# Patient Record
Sex: Male | Born: 1970
Health system: Southern US, Community
[De-identification: ages and names within clinical notes are randomized; demographics above are authoritative.]

## PROBLEM LIST (undated history)

## (undated) DIAGNOSIS — N289 Disorder of kidney and ureter, unspecified: Secondary | ICD-10-CM

## (undated) DIAGNOSIS — J45909 Unspecified asthma, uncomplicated: Secondary | ICD-10-CM

## (undated) DIAGNOSIS — J449 Chronic obstructive pulmonary disease, unspecified: Secondary | ICD-10-CM

## (undated) DIAGNOSIS — E119 Type 2 diabetes mellitus without complications: Secondary | ICD-10-CM

## (undated) DIAGNOSIS — I10 Essential (primary) hypertension: Secondary | ICD-10-CM

## (undated) DIAGNOSIS — D696 Thrombocytopenia, unspecified: Secondary | ICD-10-CM

## (undated) DIAGNOSIS — I219 Acute myocardial infarction, unspecified: Secondary | ICD-10-CM

## (undated) DIAGNOSIS — K259 Gastric ulcer, unspecified as acute or chronic, without hemorrhage or perforation: Secondary | ICD-10-CM

## (undated) DIAGNOSIS — M359 Systemic involvement of connective tissue, unspecified: Secondary | ICD-10-CM

## (undated) HISTORY — PX: OTHER SURGICAL HISTORY: SHX169

## (undated) HISTORY — PX: FOOT SURGERY: SHX648

## (undated) HISTORY — PX: CHOLECYSTECTOMY: SHX55

---

## 1998-08-19 ENCOUNTER — Encounter: Payer: Self-pay | Admitting: Emergency Medicine

## 1998-08-19 ENCOUNTER — Emergency Department (HOSPITAL_COMMUNITY): Admission: EM | Admit: 1998-08-19 | Discharge: 1998-08-19 | Payer: Self-pay | Admitting: Emergency Medicine

## 1998-08-20 ENCOUNTER — Encounter: Admission: RE | Admit: 1998-08-20 | Discharge: 1998-11-18 | Payer: Self-pay | Admitting: *Deleted

## 1998-12-15 ENCOUNTER — Emergency Department (HOSPITAL_COMMUNITY): Admission: EM | Admit: 1998-12-15 | Discharge: 1998-12-15 | Payer: Self-pay | Admitting: Emergency Medicine

## 1998-12-16 ENCOUNTER — Emergency Department (HOSPITAL_COMMUNITY): Admission: EM | Admit: 1998-12-16 | Discharge: 1998-12-16 | Payer: Self-pay

## 1998-12-21 ENCOUNTER — Encounter: Payer: Self-pay | Admitting: *Deleted

## 1998-12-22 ENCOUNTER — Encounter: Payer: Self-pay | Admitting: Emergency Medicine

## 1998-12-22 ENCOUNTER — Inpatient Hospital Stay (HOSPITAL_COMMUNITY): Admission: EM | Admit: 1998-12-22 | Discharge: 1998-12-30 | Payer: Self-pay | Admitting: *Deleted

## 1998-12-24 ENCOUNTER — Encounter: Payer: Self-pay | Admitting: Emergency Medicine

## 1998-12-26 ENCOUNTER — Encounter: Payer: Self-pay | Admitting: Emergency Medicine

## 1998-12-31 ENCOUNTER — Encounter: Payer: Self-pay | Admitting: Emergency Medicine

## 1998-12-31 ENCOUNTER — Emergency Department (HOSPITAL_COMMUNITY): Admission: EM | Admit: 1998-12-31 | Discharge: 1999-01-01 | Payer: Self-pay | Admitting: Emergency Medicine

## 1999-01-18 ENCOUNTER — Emergency Department (HOSPITAL_COMMUNITY): Admission: EM | Admit: 1999-01-18 | Discharge: 1999-01-18 | Payer: Self-pay | Admitting: Emergency Medicine

## 1999-01-18 ENCOUNTER — Encounter: Payer: Self-pay | Admitting: Emergency Medicine

## 1999-01-20 ENCOUNTER — Emergency Department (HOSPITAL_COMMUNITY): Admission: EM | Admit: 1999-01-20 | Discharge: 1999-01-20 | Payer: Self-pay | Admitting: Emergency Medicine

## 1999-01-20 ENCOUNTER — Encounter: Payer: Self-pay | Admitting: Emergency Medicine

## 1999-01-30 ENCOUNTER — Encounter: Payer: Self-pay | Admitting: Emergency Medicine

## 1999-01-30 ENCOUNTER — Inpatient Hospital Stay (HOSPITAL_COMMUNITY): Admission: EM | Admit: 1999-01-30 | Discharge: 1999-02-03 | Payer: Self-pay | Admitting: Emergency Medicine

## 1999-02-18 ENCOUNTER — Ambulatory Visit (HOSPITAL_COMMUNITY): Admission: RE | Admit: 1999-02-18 | Discharge: 1999-02-18 | Payer: Self-pay | Admitting: Gastroenterology

## 1999-05-26 ENCOUNTER — Emergency Department (HOSPITAL_COMMUNITY): Admission: EM | Admit: 1999-05-26 | Discharge: 1999-05-26 | Payer: Self-pay | Admitting: *Deleted

## 1999-05-28 ENCOUNTER — Emergency Department (HOSPITAL_COMMUNITY): Admission: EM | Admit: 1999-05-28 | Discharge: 1999-05-28 | Payer: Self-pay | Admitting: *Deleted

## 1999-06-03 ENCOUNTER — Encounter: Payer: Self-pay | Admitting: Emergency Medicine

## 1999-06-03 ENCOUNTER — Emergency Department (HOSPITAL_COMMUNITY): Admission: EM | Admit: 1999-06-03 | Discharge: 1999-06-03 | Payer: Self-pay | Admitting: Emergency Medicine

## 1999-06-08 ENCOUNTER — Encounter: Payer: Self-pay | Admitting: Emergency Medicine

## 1999-06-08 ENCOUNTER — Emergency Department (HOSPITAL_COMMUNITY): Admission: EM | Admit: 1999-06-08 | Discharge: 1999-06-08 | Payer: Self-pay | Admitting: Emergency Medicine

## 1999-07-06 ENCOUNTER — Emergency Department (HOSPITAL_COMMUNITY): Admission: EM | Admit: 1999-07-06 | Discharge: 1999-07-06 | Payer: Self-pay | Admitting: Emergency Medicine

## 2000-03-26 ENCOUNTER — Emergency Department (HOSPITAL_COMMUNITY): Admission: EM | Admit: 2000-03-26 | Discharge: 2000-03-27 | Payer: Self-pay | Admitting: Emergency Medicine

## 2000-03-30 ENCOUNTER — Encounter: Payer: Self-pay | Admitting: Emergency Medicine

## 2000-03-30 ENCOUNTER — Emergency Department (HOSPITAL_COMMUNITY): Admission: EM | Admit: 2000-03-30 | Discharge: 2000-03-30 | Payer: Self-pay | Admitting: Emergency Medicine

## 2000-05-14 ENCOUNTER — Emergency Department (HOSPITAL_COMMUNITY): Admission: EM | Admit: 2000-05-14 | Discharge: 2000-05-14 | Payer: Self-pay | Admitting: Emergency Medicine

## 2000-05-14 ENCOUNTER — Encounter: Payer: Self-pay | Admitting: Emergency Medicine

## 2000-07-01 ENCOUNTER — Ambulatory Visit (HOSPITAL_COMMUNITY): Admission: RE | Admit: 2000-07-01 | Discharge: 2000-07-01 | Payer: Self-pay

## 2004-05-31 ENCOUNTER — Emergency Department (HOSPITAL_COMMUNITY): Admission: EM | Admit: 2004-05-31 | Discharge: 2004-06-01 | Payer: Self-pay | Admitting: Emergency Medicine

## 2005-09-07 ENCOUNTER — Inpatient Hospital Stay: Payer: Self-pay | Admitting: Internal Medicine

## 2005-09-24 ENCOUNTER — Ambulatory Visit: Payer: Self-pay | Admitting: Internal Medicine

## 2005-09-24 ENCOUNTER — Encounter (INDEPENDENT_AMBULATORY_CARE_PROVIDER_SITE_OTHER): Payer: Self-pay | Admitting: Internal Medicine

## 2005-09-24 ENCOUNTER — Inpatient Hospital Stay (HOSPITAL_COMMUNITY): Admission: EM | Admit: 2005-09-24 | Discharge: 2005-09-27 | Payer: Self-pay | Admitting: Emergency Medicine

## 2006-01-26 ENCOUNTER — Emergency Department: Payer: Self-pay | Admitting: Emergency Medicine

## 2007-03-16 ENCOUNTER — Emergency Department (HOSPITAL_COMMUNITY): Admission: EM | Admit: 2007-03-16 | Discharge: 2007-03-16 | Payer: Self-pay | Admitting: Emergency Medicine

## 2007-03-17 ENCOUNTER — Emergency Department (HOSPITAL_COMMUNITY): Admission: EM | Admit: 2007-03-17 | Discharge: 2007-03-18 | Payer: Self-pay | Admitting: Emergency Medicine

## 2009-06-09 ENCOUNTER — Emergency Department: Payer: Self-pay | Admitting: Internal Medicine

## 2009-07-01 ENCOUNTER — Emergency Department: Payer: Self-pay | Admitting: Emergency Medicine

## 2009-07-09 ENCOUNTER — Emergency Department: Payer: Self-pay | Admitting: Emergency Medicine

## 2009-07-10 ENCOUNTER — Inpatient Hospital Stay: Payer: Self-pay | Admitting: Orthopedic Surgery

## 2009-07-25 ENCOUNTER — Ambulatory Visit: Payer: Self-pay | Admitting: Orthopedic Surgery

## 2009-08-14 ENCOUNTER — Emergency Department: Payer: Self-pay | Admitting: Emergency Medicine

## 2009-08-15 ENCOUNTER — Emergency Department: Payer: Self-pay | Admitting: Emergency Medicine

## 2009-08-22 ENCOUNTER — Inpatient Hospital Stay: Payer: Self-pay | Admitting: General Practice

## 2009-09-10 ENCOUNTER — Inpatient Hospital Stay: Payer: Self-pay | Admitting: Specialist

## 2009-10-25 ENCOUNTER — Emergency Department: Payer: Self-pay | Admitting: Emergency Medicine

## 2009-11-16 ENCOUNTER — Emergency Department: Payer: Self-pay | Admitting: Emergency Medicine

## 2010-01-24 ENCOUNTER — Inpatient Hospital Stay: Payer: Self-pay | Admitting: Internal Medicine

## 2010-02-08 ENCOUNTER — Emergency Department: Payer: Self-pay | Admitting: Unknown Physician Specialty

## 2010-02-09 ENCOUNTER — Emergency Department: Payer: Self-pay | Admitting: Emergency Medicine

## 2010-03-07 ENCOUNTER — Inpatient Hospital Stay: Payer: Self-pay | Admitting: Internal Medicine

## 2010-03-31 ENCOUNTER — Emergency Department: Payer: Self-pay | Admitting: Unknown Physician Specialty

## 2010-04-02 ENCOUNTER — Emergency Department: Payer: Self-pay | Admitting: Emergency Medicine

## 2010-04-07 ENCOUNTER — Inpatient Hospital Stay: Payer: Self-pay | Admitting: Internal Medicine

## 2010-04-28 ENCOUNTER — Emergency Department: Payer: Self-pay | Admitting: Emergency Medicine

## 2010-05-01 ENCOUNTER — Emergency Department: Payer: Self-pay | Admitting: Emergency Medicine

## 2010-05-27 ENCOUNTER — Inpatient Hospital Stay: Payer: Self-pay | Admitting: Internal Medicine

## 2010-06-24 ENCOUNTER — Emergency Department: Payer: Self-pay | Admitting: Emergency Medicine

## 2010-06-24 ENCOUNTER — Emergency Department: Payer: Self-pay | Admitting: Unknown Physician Specialty

## 2010-06-27 ENCOUNTER — Inpatient Hospital Stay: Payer: Self-pay | Admitting: Specialist

## 2010-09-15 ENCOUNTER — Emergency Department: Payer: Self-pay | Admitting: Emergency Medicine

## 2010-10-05 ENCOUNTER — Emergency Department: Payer: Self-pay | Admitting: Emergency Medicine

## 2010-11-28 ENCOUNTER — Emergency Department: Payer: Self-pay | Admitting: Emergency Medicine

## 2011-01-04 ENCOUNTER — Emergency Department: Payer: Self-pay | Admitting: Emergency Medicine

## 2011-01-11 ENCOUNTER — Inpatient Hospital Stay: Payer: Self-pay | Admitting: Internal Medicine

## 2011-01-16 ENCOUNTER — Inpatient Hospital Stay: Payer: Self-pay | Admitting: Internal Medicine

## 2011-01-30 NOTE — H&P (Signed)
NAME:  [name], [name]            ACCOUNT NO.:  [id number]   MEDICAL RECORD NO.:  [id number]          PATIENT TYPE:  INP   LOCATION:  A332                          FACILITY:  [facility]   PHYSICIAN:  Lionel December, M.D.    DATE OF BIRTH:  1970/12/02   DATE OF ADMISSION:  09/24/2005  DATE OF DISCHARGE:  LH                                HISTORY & PHYSICAL   ADDENDUM TO HISTORY AND PHYSICAL EXAMINATION  This is an addendum to previous job dictation [id number].  The following was  excluded from the previous history and physical.   The patient reports being hospitalized in the past secondary to cocaine  overdose.  He states he had some heart trouble during the time but no  residual cardiomyopathy, etc.  He states he no longer uses cocaine.  He  tells me he uses no drugs or alcohol but the nursing staff says he admitted  that he smokes one joint of marijuana every two days and occasionally  consumes alcohol, the last time one month ago.  He smokes one and one-half  packs of cigarettes daily.      Tana Coast, P.A.      Lionel December, M.D.  Electronically Signed    LL/MEDQ  D:  09/24/2005  T:  09/24/2005  Job:  [id number]

## 2011-01-30 NOTE — Consult Note (Signed)
NAME:  [name], [name]            ACCOUNT NO.:  [id number]   MEDICAL RECORD NO.:  [id number]          PATIENT TYPE:  INP   LOCATION:  A332                          FACILITY:  [facility]   PHYSICIAN:  Lionel December, M.D.    DATE OF BIRTH:  08/10/1971   DATE OF CONSULTATION:  09/24/2005  DATE OF DISCHARGE:                                   CONSULTATION   REASON FOR CONSULTATION:  Abdominal pain, hematemesis, history of peptic  ulcer disease.   REFERRING PHYSICIAN:  Kirk Ruths, M.D.   HISTORY OF PRESENT ILLNESS:  The patient is a 40 year old, Caucasian  gentleman with history of bleeding peptic ulcer disease who was admitted  with recurrent upper abdominal pain and hematemesis.  He was hospitalized at  [facility] when he was in town visiting his mother-in-law.  He was in the hospital from December 24, thru September 14, 2005.  He states  that was his fifth hospitalization in the past 12 months at that time.  Her  was on Prilosec 40 mg b.i.d. at the time.  He states he was taking lots of  Excedrin.  He had his first EGD during that hospitalization.  He was told he  had ulcerations in his stomach which were bleeding.  He was switched to  Aciphex and Carafate was begun.  He has also been in the hospital in  [place], [place] and [address], where he lives four times in the past  12 months there.  This makes his sixth hospitalization.  He states he had  one good week since he was discharged on September 14, 2005.  Last night, he  began having excruciating pain around 11 p.m.  It felt like his stomach was  in a knot.  He vomited one time and on the second episode he vomited a large  amount of fresh blood.  He says he has chronic, poorly-controlled acid  reflux disease.  He denies any dysphagia or odynophagia.  The only weight  loss he has is when he is NPO in the hospital.  He has chronic diarrhea  usually two to three loose stools a day.  No significant change at this  time.  Denies any melena or rectal bleeding.  His eating exacerbates his  abdominal pain.  Milk occasionally makes it better.  He is not sure of his  H. pylori status.   MEDICATIONS ON ADMISSION:  1.  Seroquel 400 mg daily.  2.  Neurontin 600 mg b.i.d.  3.  Aciphex 20 mg b.i.d.  4.  Carafate four times a day.  5.  Zocor 10 mg daily.  6.  Trazodone 25 mg four times a day.   ALLERGIES:  DEMEROL and ERYTHROMYCIN.   PAST MEDICAL HISTORY:  1.  Bipolar disorder.  2.  Post-traumatic stress disorder.  3.  Hypercholesterolemia.  4.  Chronic back and knee pain for which he was taking lots of Excedrin, but      he discontinued.  5.  History of peptic ulcer disease with bleeding as outlined above.  6.  Bilateral knee arthroscopies several times.  7.  Fusion of right fifth finger distal joint.  8.  Left hand repaired due to laceration of tendon from trauma.   FAMILY HISTORY:  He is not aware of his family history as he is adopted.   SOCIAL HISTORY:  He is married.  He has no children.  He is a disabled  Cytogeneticist as of 2001.  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  He has a Proofreader.  He no longer works,  however.  He lives in [place] with his wife.   REVIEW OF SYSTEMS:  GASTROINTESTINAL:  See HPI.  CONSTITUTIONAL:  See HPI.  CARDIOPULMONARY:  Denies any chest pain or shortness of breath.  GENITOURINARY:  No dysuria or hematuria.   PHYSICAL EXAMINATION:  VITAL SIGNS:  Temperature 98.5, blood pressure  131/76, pulse 86, respirations 16.  GENERAL:  Pleasant, well-developed, well-nourished, Caucasian male in no  acute distress.  SKIN:  Warm and dry, no jaundice.  HEENT:  Conjunctivae are pink, sclerae nonicteric, oropharyngeal mucosa  moist and pink.  No lesions, erythema or exudate.  No lymphadenopathy or  thyromegaly.  CHEST:  Lungs clear to auscultation.  CARDIAC:  Regular rate and rhythm with normal S1, S2.  No murmurs, rubs or   gallops.  ABDOMEN:  Positive bowel sounds, soft, nondistended.  He has moderate  tenderness primarily in the left epigastrium, but also in the bilateral  upper quadrants.  No rebound tenderness or guarding.  No abdominal bruits or  hernias.  No hepatosplenomegaly or masses.  EXTREMITIES:  No edema.   LABORATORY DATA AND X-RAY FINDINGS:  White count 16,200, hemoglobin 13.5,  hematocrit 39.4, platelets 372,000.  Sodium 140, potassium 3.8, BUN 8,  creatinine 0.9, glucose 113.  Total bilirubin 0.4, Alk phos 131, SGOT 21,  SGPT 16, albumin 3.8, amylase 47, lipase 26.  Urinalysis was negative.   IMPRESSION:  The patient is a 40 year old, Caucasian gentleman who has been  hospitalized now six times in the last 12 months with abdominal pain, nausea  and vomiting.  He reports within the last couple of weeks when he was  hospitalized in [facility] that he had an upper endoscopy which  revealed bleeding ulcer in his stomach.  We have not received request for  records as of yet.  He now has recurrence of his upper abdominal pain as  well as hematemesis.  Previously, he had been on a lot of Excedrin along  with Prilosec 80 mg daily.  He says he is now on Aciphex with no more  nonsteroidal anti-inflammatory drugs or aspirin.  His white count is 16,000,  hemoglobin stable at 13.5.  He also has chronic acid reflux which is poorly-  controlled.  Suspected symptoms secondary to peptic ulcer disease.  You have  to be concerned about possibility of perforation given acute onset of  recurrent pain and leukocytosis.   RECOMMENDATIONS:  1.  EGD today.  2.  IV Protonix.  3.  Records from [facility].  4.  Follow H&H this afternoon.   I would like to thank Dr. Yetta Numbers for allowing [place] to take part in the  care of this patient.      Tana Coast, P.A.      Lionel December, M.D.  Electronically Signed   LL/MEDQ  D:  09/24/2005  T:  09/24/2005  Job:  [id number]

## 2011-01-30 NOTE — Op Note (Signed)
NAMEAHREN, PETTINGER            ACCOUNT NO.:  [id number]   MEDICAL RECORD NO.:  [id number]          PATIENT TYPE:  INP   LOCATION:  A332                          FACILITY:  [facility]   PHYSICIAN:  Lionel December, M.D.    DATE OF BIRTH:  1970/11/22   DATE OF PROCEDURE:  09/24/2005  DATE OF DISCHARGE:                                 OPERATIVE REPORT   PROCEDURE:  Esophagogastroduodenoscopy.   INDICATIONS:  [name] is a 40 year old Caucasian male who presents with upper  GI bleed. He was discharged from [facility] about 10 days  ago. He states he was told that his stomach lining was completely eroded. He  has been on PPI double dose and denies using NSAIDs. Procedure risks were  reviewed the patient, and informed consent was obtained.   MEDICINES FOR CONSCIOUS SEDATION:  Cetacaine spray for pharyngeal topical  anesthesia, Demerol 50 mg IV, Versed 10 mg IV.   FINDINGS:  Procedure performed in endoscopy suite. The patient's vital signs  and O2 saturation were monitored during the procedure and remained stable.  The patient was placed in left lateral position. Olympus videoscope was  passed via oropharynx without any difficulty into the esophagus.   Esophagus. Mucosa of the proximal middle third was normal. Distal 5 cm had  few small erosions without stigmata of bleeding. GE junction was at 40 cm  from the incisors.   Stomach. It distended very well. There was some coffee-ground coating the  mucosa at gastric body. Folds were prominent, and there were few erosions.  Antral mucosa as well as fundal mucosa was spared. Angularis was normal.  Pyloric channel was patent. Biopsy was taken from these erythematous folds  on the way out.   Duodenum. Bulbar mucosa was normal. Scope was passed into second part of the  duodenum where mucosa and folds were normal. Endoscope was withdrawn. The  patient tolerated the procedure well.   FINAL DIAGNOSIS:  1.  Erosive reflux esophagitis.  2.  Erosive gastritis, predominantly at body of the stomach with stigmata of      bleeding but no active bleeding noted. Biopsy taken to rule out      infiltrative process.   I suspect this injury is secondary to NSAID use.   RECOMMENDATIONS:  Will continue PPI. Add Carafate liquid 1 gram a.c. and  q.h.s. Full liquid diet. H&H will be checked in a.m.      Lionel December, M.D.  Electronically Signed     NR/MEDQ  D:  09/24/2005  T:  09/25/2005  Job:  [id number]

## 2011-01-30 NOTE — Consult Note (Signed)
[place]  Patient:    [name], [name]                   MRN: [id number] Proc. Date: 05/14/00 Adm. Date:  [id number] Attending:  Osvaldo Human                          Consultation Report  REFERRING PHYSICIAN:  Osvaldo Human, M.D.  REASON FOR CONSULTATION:  Mr. 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  He is an otherwise healthy 40 year old male.  ALLERGIES:  ERYTHROMYCIN.  MEDICATIONS:  Currently taking no medications.  PAST MEDICAL HISTORY:  No past medical history of note.  PAST SURGICAL HISTORY:  No past surgical history of note.  FAMILY HISTORY:  Noncontributory.  SOCIAL HISTORY:  Noncontributory.  PHYSICAL EXAMINATION:  GENERAL:  A well-developed, well-nourished male, pleasant, alert and oriented x 3.  EXTREMITIES:  Examination of his hand on the right:  He has an oblique laceration across the dorsal aspect of his index finger from just proximal to the PIP joint to the level of the DIP joint, with loss of active extension. The patient also has a small abrasion over the proximal phalanx.  He is able to flex and extend at the MP, PIP, and DIP joints.  Full tendon function is noted, although it is somewhat painful secondary to his dorsal laceration.  He has a 2+ radial pulse and brisk capillary refill.  The finger is otherwise intact.  There is no erythema or other sign of infection.  DIAGNOSTIC EVALUATION:  X-rays in the emergency department showed no evidence of acute fracture-dislocation, and he has no significant osseous abnormalities.  IMPRESSION:  A 40 year old male with what appears to be an extensor tendon laceration, dorsal aspect right index  finger.  DESCRIPTION OF PROCEDURE:  The patient was anesthetized using 2% plain lidocaine.  Once adequate anesthesia was obtained, the right upper extremity was prepped and draped in the usual sterile fashion.  A Penrose drain was used as a tourniquet at the base of the finger, and the wound was explored.  There was an obvious laceration to the extensor mechanism between the PIP and DIP joints.  This was debrided of clot and nonviable material and repaired using 4-0 Mersilene in three horizontal mattress sutures.  The wound was then irrigated and closed with 4-0 nylon in a combination of simple and horizontal mattress sutures, and a sterile dressing of Xeroform, 4 x 4s, a compressive wrap, as well as a volar extensor splint was applied.  The patient tolerated the procedure well and was discharged from the emergency department with Keflex 500 mg one p.o. q.i.d. for a week for antibiotic prophylaxis, as well as Vicodin one to two every three to four hours as needed for pain, #30. Follow up in my office in the next five to seven days.DD:  05/14/00 TD:  05/15/00 Job: [id number]

## 2011-01-30 NOTE — Discharge Summary (Signed)
[name], [name]            ACCOUNT NO.:  [id number]   MEDICAL RECORD NO.:  [id number]          PATIENT TYPE:  INP   LOCATION:  A332                          FACILITY:  [facility]   PHYSICIAN:  Kirk Ruths, M.D.DATE OF BIRTH:  May 02, 1971   DATE OF ADMISSION:  09/24/2005  DATE OF DISCHARGE:  01/14/2007LH                                 DISCHARGE SUMMARY   DISCHARGE DIAGNOSES:  1.  Peptic ulcer disease.  2.  Bipolar disorder.  3.  Posttraumatic stress disorder.  4.  Chronic back and knee pain.   HOSPITAL COURSE:  This 40 year old male was admitted through the emergency  room after presenting with severe upper abdominal pain with bloody vomitus.  The patient, who is from [place], states this is his sixth admission  for peptic ulcer disease in the last year.  His last was a week before in  [place].  The patient's hemoglobin at the time of admission was 13.5,  white count 16.2.  Chemistries were normal except for Alk phos of 131.  Amylase and lipase were also normal as well as his urinalysis.  The patient  was seen in consultation by GI.  Meanwhile, his hemoglobins were followed  closely.  Hemoglobin the day after admission, approximately 12 hours later,  was 11.7 and remained stable at 12 and 12.7 throughout this stay.  The  patient's pain was treated with IV Dilaudid and he underwent EGD which  showed erosive gastritis and esophagitis.  The patient continued to have  pain, but his hemoglobins remained stable throughout the stay.  CT of the  abdomen and pelvis was obtained and was normal.  Repeat amylase and lipase  was normal.  He continued to have pain with persistent nausea and vomiting.  Ultrasound of his abdomen was obtained to rule out gallstones and it was  reported normal also.  Finally, the patient's pain resolved, although he did  develop a rash with Dilaudid.  He was allergic to Dilaudid, also.   The patient was discharged to home.  He was stable at the time  of discharge.   DISCHARGE MEDICATIONS:  1.  Protonix.  2.  Tylox as needed.  3.  Phenergan.  4.  Seroquel 400 mg daily.  5.  Neurontin 600 mg b.i.d.  6.  Carafate four times a day.  7.  Zocor 10 daily.  8.  Trazodone 25 mg four times a day.      Kirk Ruths, M.D.  Electronically Signed     WMM/MEDQ  D:  10/20/2005  T:  10/20/2005  Job:  [id number]

## 2011-01-30 NOTE — H&P (Signed)
NAMEHALSTON, [name]            ACCOUNT NO.:  [id number]   MEDICAL RECORD NO.:  [id number]          PATIENT TYPE:  INP   LOCATION:  A332                          FACILITY:  [facility]   PHYSICIAN:  Kirk Ruths, M.D.DATE OF BIRTH:  [birth year]   DATE OF ADMISSION:  09/24/2005  DATE OF DISCHARGE:  LH                                HISTORY & PHYSICAL   CHIEF COMPLAINT:  Abdominal pain and bloody vomitus.   HISTORY OF PRESENT ILLNESS:  This 40 year old male lives in [place].  The patient states this is his sixth hospitalization in the last year for  peptic ulcer disease sometimes associated with bleeding and sometimes  associated with severe pain.  The patient was last hospitalized a week  before this in [place] where he states he had bleeding ulcers at that  time.   ALLERGIES:  DEMEROL.  ERYTHROMYCIN.   MEDICATIONS:  1.  Seroquel 400 mg daily.  2.  Neurontin 600 mg b.i.d.  3.  Aciphex 20 mg b.i.d.  4.  Carafate four times a day.  5.  Zocor 10 mg daily.  6.  Trazodone 25 mg four times a day.   PAST MEDICAL HISTORY:  1.  History of bipolar disorder.  2.  Posttraumatic stress from [place] War.  3.  Hypercholesterolemia.  4.  Chronic back and knee pain.  5.  Above-mentioned peptic ulcer disease.   REVIEW OF SYSTEMS:  The patient denies chest pain or shortness of breath.  He does admit to excessive Excedrin use in the past, but none recently.   SOCIAL HISTORY:  The patient is disabled from posttraumatic stress from  [place] War and denies cigarette or significant alcohol abuse.   PHYSICAL EXAMINATION:  GENERAL:  A well-developed male in no severe  distress.  VITAL SIGNS:  Afebrile.  Blood pressure 130/75, pulse 86, respirations 18  and unlabored.  HEENT:  Normal.  Pupils equal and reactive to light and accommodation.  Oropharynx benign.  NECK:  Supple without JVD, bruit or thyromegaly.  LUNGS:  Clear in all areas.  HEART:  Regular sinus rhythm without  murmurs, rubs or gallops.  ABDOMEN:  Positive bowel sounds.  There is mild left upper quadrant  tenderness.  EXTREMITIES:  Without clubbing, cyanosis or edema.  NEUROLOGIC:  Grossly intact.   ASSESSMENT:  1.  Abdominal pain with bloody emesis.  2.  Bipolar disorder.  3.  Posttraumatic stress disorder.  4.  Chronic arthritis.      Kirk Ruths, M.D.  Electronically Signed     WMM/MEDQ  D:  10/20/2005  T:  10/20/2005  Job:  [id number]

## 2011-02-05 ENCOUNTER — Emergency Department: Payer: Self-pay | Admitting: Emergency Medicine

## 2011-03-04 ENCOUNTER — Emergency Department: Payer: Self-pay | Admitting: Unknown Physician Specialty

## 2011-03-28 ENCOUNTER — Emergency Department: Payer: Self-pay | Admitting: Emergency Medicine

## 2011-04-01 ENCOUNTER — Emergency Department (HOSPITAL_COMMUNITY): Payer: Medicare Other

## 2011-04-01 ENCOUNTER — Observation Stay (HOSPITAL_COMMUNITY): Payer: Medicare Other

## 2011-04-01 ENCOUNTER — Inpatient Hospital Stay (HOSPITAL_COMMUNITY)
Admission: EM | Admit: 2011-04-01 | Discharge: 2011-04-02 | DRG: 069 | Disposition: A | Discharge status: Home Health | Payer: Medicare Other | Attending: Internal Medicine | Admitting: Internal Medicine

## 2011-04-01 DIAGNOSIS — F1410 Cocaine abuse, uncomplicated: Secondary | ICD-10-CM | POA: Diagnosis present

## 2011-04-01 DIAGNOSIS — R209 Unspecified disturbances of skin sensation: Secondary | ICD-10-CM

## 2011-04-01 DIAGNOSIS — Z79899 Other long term (current) drug therapy: Secondary | ICD-10-CM

## 2011-04-01 DIAGNOSIS — D638 Anemia in other chronic diseases classified elsewhere: Secondary | ICD-10-CM | POA: Diagnosis present

## 2011-04-01 DIAGNOSIS — I252 Old myocardial infarction: Secondary | ICD-10-CM

## 2011-04-01 DIAGNOSIS — Z8673 Personal history of transient ischemic attack (TIA), and cerebral infarction without residual deficits: Secondary | ICD-10-CM

## 2011-04-01 DIAGNOSIS — F17200 Nicotine dependence, unspecified, uncomplicated: Secondary | ICD-10-CM | POA: Diagnosis present

## 2011-04-01 DIAGNOSIS — R4789 Other speech disturbances: Secondary | ICD-10-CM

## 2011-04-01 DIAGNOSIS — F449 Dissociative and conversion disorder, unspecified: Secondary | ICD-10-CM | POA: Diagnosis present

## 2011-04-01 DIAGNOSIS — F4310 Post-traumatic stress disorder, unspecified: Secondary | ICD-10-CM | POA: Diagnosis present

## 2011-04-01 DIAGNOSIS — G894 Chronic pain syndrome: Secondary | ICD-10-CM | POA: Diagnosis present

## 2011-04-01 DIAGNOSIS — E876 Hypokalemia: Secondary | ICD-10-CM | POA: Diagnosis present

## 2011-04-01 DIAGNOSIS — F319 Bipolar disorder, unspecified: Secondary | ICD-10-CM | POA: Diagnosis present

## 2011-04-01 DIAGNOSIS — I2510 Atherosclerotic heart disease of native coronary artery without angina pectoris: Secondary | ICD-10-CM | POA: Diagnosis present

## 2011-04-01 DIAGNOSIS — G459 Transient cerebral ischemic attack, unspecified: Principal | ICD-10-CM | POA: Diagnosis present

## 2011-04-01 LAB — URINALYSIS, ROUTINE W REFLEX MICROSCOPIC
Bilirubin Urine: NEGATIVE
Glucose, UA: 250 mg/dL — AB
Ketones, ur: NEGATIVE mg/dL
Leukocytes, UA: NEGATIVE
Protein, ur: 30 mg/dL — AB
Specific Gravity, Urine: 1.019 (ref 1.005–1.030)
pH: 6 (ref 5.0–8.0)

## 2011-04-01 LAB — DIFFERENTIAL
Basophils Absolute: 0 10*3/uL (ref 0.0–0.1)
Eosinophils Relative: 7 % — ABNORMAL HIGH (ref 0–5)
Lymphocytes Relative: 22 % (ref 12–46)
Lymphs Abs: 1.4 10*3/uL (ref 0.7–4.0)
Monocytes Absolute: 0.5 10*3/uL (ref 0.1–1.0)

## 2011-04-01 LAB — CARDIAC PANEL(CRET KIN+CKTOT+MB+TROPI)
CK, MB: 4 ng/mL (ref 0.3–4.0)
Relative Index: 0.9 (ref 0.0–2.5)
Total CK: 451 U/L — ABNORMAL HIGH (ref 7–232)
Total CK: 385 U/L — ABNORMAL HIGH (ref 7–232)
Troponin I: <0.3 ng/mL (ref <0.30)

## 2011-04-01 LAB — COMPREHENSIVE METABOLIC PANEL
BUN: 10 mg/dL (ref 6–23)
Calcium: 8.6 mg/dL (ref 8.4–10.5)
Creatinine, Ser: 0.99 mg/dL (ref 0.50–1.35)
GFR calc Af Amer: >60 mL/min (ref >60)
Glucose, Bld: 143 mg/dL — ABNORMAL HIGH (ref 70–99)
Total Protein: 6.6 g/dL (ref 6.0–8.3)

## 2011-04-01 LAB — BASIC METABOLIC PANEL
BUN: 6 mg/dL (ref 6–23)
Calcium: 8 mg/dL — ABNORMAL LOW (ref 8.4–10.5)
GFR calc Af Amer: >60 mL/min (ref >60)
GFR calc non Af Amer: >60 mL/min (ref >60)
Glucose, Bld: 121 mg/dL — ABNORMAL HIGH (ref 70–99)
Sodium: 140 mEq/L (ref 135–145)

## 2011-04-01 LAB — CBC
HCT: 30.9 % — ABNORMAL LOW (ref 39.0–52.0)
MCH: 32.7 pg (ref 26.0–34.0)
MCHC: 34 g/dL (ref 30.0–36.0)
MCV: 96.3 fL (ref 78.0–100.0)
RDW: 13.6 % (ref 11.5–15.5)

## 2011-04-01 LAB — PROTIME-INR: Prothrombin Time: 14 seconds (ref 11.6–15.2)

## 2011-04-01 LAB — RAPID URINE DRUG SCREEN, HOSP PERFORMED
Benzodiazepines: POSITIVE — AB
Opiates: NOT DETECTED

## 2011-04-01 LAB — URINE MICROSCOPIC-ADD ON

## 2011-04-01 LAB — ETHANOL: Alcohol, Ethyl (B): <11 mg/dL (ref 0–11)

## 2011-04-01 MED ORDER — GADOBENATE DIMEGLUMINE 529 MG/ML IV SOLN
15.0000 mL | Freq: Once | INTRAVENOUS | Status: AC
  Administered 2011-04-01: 15 mL via INTRAVENOUS

## 2011-04-02 LAB — COMPREHENSIVE METABOLIC PANEL
ALT: 18 U/L (ref 0–53)
Alkaline Phosphatase: 131 U/L — ABNORMAL HIGH (ref 39–117)
CO2: 31 mEq/L (ref 19–32)
Chloride: 105 mEq/L (ref 96–112)
GFR calc Af Amer: >60 mL/min (ref >60)
GFR calc non Af Amer: >60 mL/min (ref >60)
Glucose, Bld: 119 mg/dL — ABNORMAL HIGH (ref 70–99)
Potassium: 3.2 mEq/L — ABNORMAL LOW (ref 3.5–5.1)
Sodium: 142 mEq/L (ref 135–145)
Total Bilirubin: 0.2 mg/dL — ABNORMAL LOW (ref 0.3–1.2)

## 2011-04-02 LAB — LIPID PANEL: LDL Cholesterol: 49 mg/dL (ref 0–99)

## 2011-04-02 LAB — CARDIAC PANEL(CRET KIN+CKTOT+MB+TROPI)
Relative Index: 1.1 (ref 0.0–2.5)
Total CK: 333 U/L — ABNORMAL HIGH (ref 7–232)

## 2011-04-02 LAB — CBC
Hemoglobin: 9 g/dL — ABNORMAL LOW (ref 13.0–17.0)
MCH: 32.5 pg (ref 26.0–34.0)
RBC: 2.77 MIL/uL — ABNORMAL LOW (ref 4.22–5.81)
WBC: 5.3 10*3/uL (ref 4.0–10.5)

## 2011-04-02 MED ORDER — GADOBENATE DIMEGLUMINE 529 MG/ML IV SOLN: 15.0000 mL | Freq: Once | INTRAVENOUS | Status: DC

## 2011-04-03 LAB — CULTURE, BLOOD (ROUTINE X 2): Culture  Setup Time: 201207181058

## 2011-04-07 LAB — CULTURE, BLOOD (ROUTINE X 2): Culture: NO GROWTH

## 2011-04-08 ENCOUNTER — Emergency Department: Payer: Self-pay | Admitting: Internal Medicine

## 2011-04-16 NOTE — Discharge Summary (Signed)
  [name], [name] NO.:  [id number]  MEDICAL RECORD NO.:  [id number]  LOCATION:  4705                         FACILITY:  [facility]  PHYSICIAN:  Zannie Cove, MD     DATE OF BIRTH:  08-06-1971  DATE OF ADMISSION:  04/01/2011 DATE OF DISCHARGE:                              DISCHARGE SUMMARY   PRIMARY CARE PHYSICIAN:  Dr. Nedra Hai, [place].  CARDIOLOGIST:  Dr. Chales Abrahams, also in [place].  DISCHARGE DIAGNOSES: 1. Transient ischemic attack versus conversion disorder. 2. Polysubstance/cocaine abuse. 3. History of cerebrovascular accident. 4. History of bipolar disorder. 5. History of posttraumatic stress disorder. 6. Chronic back and knee pain. 7. Chronic pain syndrome. 8. Reported history of coronary artery disease. 9. Tobacco use.  DISCHARGE MEDICATIONS: 1. Aspirin 325 mg daily. 2. Albuterol 90 mcg inhaler 1 to 2 puffs q.6 h p.r.n. 3. Coreg 12.5 mg p.o. b.i.d. 4. Dilaudid 4 mg p.o. q.i.d.5. Lamictal 100 mg half tablet p.o. b.i.d. 6. Morphine sulfate 100 mg 1 tablet q.i.d. 7. Seroquel 300 mg p.o. at bedtime. 8. Vitamin D over-the-counter 1 tablet daily.  DIAGNOSTIC INVESTIGATIONS:  X-ray shows superficial soft tissue swelling stranding correlate for cellulitis.  CT of the head shows no acute intracranial hemorrhage or CT evidence of large infarct.  MRI/MRA of the brain showed brain was within normal limits.  MRA was negative and MRA of neck also was negative.  HOSPITAL COURSE:  [name] is a 40 year old male with history of bipolar disorder, posttraumatic stress disorder, and cocaine abuse, presented to the hospital with right hand weakness as well as generalized pain.  Initially, there was a concern for CVA versus TIA. However, through his course, it was determined that his exam was pretty inconsistent between the physician as well as several physical therapists and possibility of conversion disorder.  Basically, he had an MRI, which  ruled out a stroke.  However, he subsequently still complain of some weakness and numbness in his right hand, which has improved, not completely resolved.  We put him on aspirin and also advised him about cessation of cocaine use.  Rest of his chronic medical problems remained stable.  The patient is being discharged home to follow up with his primary doctor in [facility] Dr. Nedra Hai and cardiologist.     Zannie Cove, MD     PJ/MEDQ  D:  04/02/2011  T:  04/02/2011  Job:  [id number]  cc:   Dr. Nedra Hai at [facility]  Electronically Signed by Zannie Cove  on 04/16/2011 04:13:02 PM

## 2011-04-17 ENCOUNTER — Emergency Department: Payer: Self-pay | Admitting: *Deleted

## 2011-05-18 ENCOUNTER — Emergency Department: Payer: Self-pay | Admitting: *Deleted

## 2011-06-27 NOTE — H&P (Signed)
NAMEHARDIN, [name] NO.:  [id number]  MEDICAL RECORD NO.:  [id number]  LOCATION:  4705                         FACILITY:  [facility]  PHYSICIAN:  Lonia Blood, M.D.      DATE OF BIRTH:  September 03, 1971  DATE OF ADMISSION:  04/01/2011 DATE OF DISCHARGE:                             HISTORY & PHYSICAL   PRIMARY CARE PHYSICIAN:  He is unassigned to [place].  PRESENTING COMPLAINT:  Numbness, weakness of the right side, and hypokalemia.  HISTORY OF PRESENT ILLNESS:  The patient is a 40 year old gentleman with history of bipolar disorder, myocardial infarction, and history of previous TTP.  He abuses cocaine.  He came in with complaint of right- sided weakness, left knee swelling and pain as well as nausea.  He also complained of back pain where he "had compression fracture."  The patient's wife said he was slurring for words, no normal speech.  He was at [facility] last night for right foot cellulitis and for the left knee cellulitis rather, and he went home and after he went home, his symptoms started.  Denied any fever or chills.  No nausea, vomiting, or diarrhea.  The patient is currently having full strength.  His numbness is more on the face at this point.  PAST MEDICAL HISTORY:  Extensive including cocaine abuse, reported history of CVA, bipolar disorder, history of coronary artery disease, post-traumatic stress disorder, TTP, and polysubstance abuse.  ALLERGIES:  DEMEROL and ERYTHROMYCIN.  MEDICATIONS:  The patient could not provide list of his medicine at this point.  SOCIAL HISTORY:  He lives in [place].  He abuses cocaine. Also smokes about 1 pack per day.  Denied alcohol.  Denied other IV drugs.  MEDICATION LIST:  Now available showing that he is also allergic to DILAUDID. 1. Albuterol 90 mcg inhaler at 1-2 puffs q.6 h. p.r.n. 2. Vitamin D over-the-counter as needed. 3. Coreg 12.5 mg twice daily. 4. Lamictal 100 mg twice daily. 5.  Seroquel 300 mg at bedtime. 6. Dilaudid 4 mg 4 times a day for severe pain. 7. Morphine sulfate 15 mg 4 times daily. 8. Morphine CR 100 mg 4 times daily. 9. Opana ER 40 mg 3 times daily.  FAMILY HISTORY:  Noncontributory.  REVIEW OF SYSTEM:  Mainly, back pain.  Otherwise, all systems reviewed are currently negative except per HPI.  PHYSICAL EXAMINATION:  VITAL SIGNS:  Temperature 91, blood pressure 133/82 with pulse 106, respiratory rate 14, and sat 100% on room air. GENERAL:  He is awake, alert, oriented, withdrawn.  He is in no acute distress. HEENT:  PERRLA.  EOMI.  No pallor.  No jaundice.  No rhinorrhea. NECK:  Supple.  No JVD.  No lymphadenopathy. RESPIRATORY:  He has good air entry bilaterally.  No wheezes.  No rales. No crackles. CARDIOVASCULAR:  He has S1 and S2.  No audible murmurs. ABDOMEN:  Soft, full, nontender with positive bowel sounds. EXTREMITIES:  No edema, cyanosis, or clubbing. SKIN:  No rashes or ulcers. MUSCULOSKELETAL:  Left knee joint is slightly distended with mainly soft tissue swelling.  No obvious fullness or fluid collection.  LABORATORY DATA:  White count is 6.4, hemoglobin 10.5 with MCV of 96,  and his platelet count is 198.  Lactic acid 1.6.  Sodium 137, potassium 2.7, chloride 101, CO2 29, glucose 143, BUN 10, creatinine 0.99 with calcium 8.6.  Alcohol level less than 0.11.  Urinalysis showed glucosuria with some hemoglobin and protein, otherwise negative urine microscopy.  Urine drug screen is positive for cocaine and benzodiazepines.  Head CT without contrast showed no intracranial hemorrhage.  Chest x-ray showed increased interstitial markings, which are chronic.  Left knee x-ray showed slight soft tissue swelling more consistent with cellulitis.  ASSESSMENT:  This is a 40 year old gentleman presenting with right-sided weakness, numbness, also severe hypokalemia, and also polysubstance abuse.  PLAN: 1. Right-sided weakness and numbness.   There is no focal neurologic     finding currently on exam.  With the cocaine involved, this could     be cocaine induced symptoms.  We will admit him and do a full TIA     workup.  Also work him up for possible embolic phenomenon.  PT/OT     as needed.  Cycle his enzymes as necessary. 2. Polysubstance abuse.  He will be counseled extensively on the use     of the substances. 3. Severe hypokalemia.  We will replete his potassium.  The cause of     his hypokalemia is unknown at this point.  Even though, he uses     albuterol, I doubt that is the cause.  I will continue, however,     with replacement and check his magnesium level. 4. Bipolar disorder.  We will resume his home medications. 5. Anemia of chronic disease, seems stable. 6. Reported history of coronary artery disease.  We will check his     serial cardiac enzymes.  Further treatment depends on how the     patient responds to these measures.     Lonia Blood, M.D.     Verlin Grills  D:  04/01/2011  T:  04/01/2011  Job:  [id number]  Electronically Signed by Lonia Blood M.D. on 06/27/2011 02:44:30 PM

## 2011-06-30 LAB — DIFFERENTIAL
Basophils Relative: 1
Lymphs Abs: 1.7
Monocytes Absolute: 0.4
Monocytes Relative: 3
Neutro Abs: 10.8 — ABNORMAL HIGH
Neutrophils Relative %: 82 — ABNORMAL HIGH

## 2011-06-30 LAB — COMPREHENSIVE METABOLIC PANEL
ALT: 16
Albumin: 4
Alkaline Phosphatase: 130 — ABNORMAL HIGH
BUN: 12
Calcium: 9.4
Potassium: 4
Sodium: 141
Total Protein: 7.6

## 2011-06-30 LAB — CBC
MCHC: 34.9
Platelets: 562 — ABNORMAL HIGH
RDW: 14.3 — ABNORMAL HIGH

## 2011-09-15 LAB — CBC
HCT: 39.6 % — ABNORMAL LOW (ref 40.0–52.0)
MCH: 33.7 pg (ref 26.0–34.0)
Platelet: 345 10*3/uL (ref 150–440)
RDW: 15.1 % — ABNORMAL HIGH (ref 11.5–14.5)
WBC: 7.9 10*3/uL (ref 3.8–10.6)

## 2011-09-15 LAB — COMPREHENSIVE METABOLIC PANEL
Alkaline Phosphatase: 121 U/L (ref 50–136)
Bilirubin,Total: 0.4 mg/dL (ref 0.2–1.0)
Calcium, Total: 9 mg/dL (ref 8.5–10.1)
Chloride: 105 mmol/L (ref 98–107)
Co2: 27 mmol/L (ref 21–32)
Creatinine: 1.07 mg/dL (ref 0.60–1.30)
EGFR (African American): >60
EGFR (Non-African Amer.): >60
Osmolality: 279 (ref 275–301)
Potassium: 3.7 mmol/L (ref 3.5–5.1)
Sodium: 142 mmol/L (ref 136–145)

## 2011-09-15 LAB — ACETAMINOPHEN LEVEL: Acetaminophen: <2 ug/mL

## 2011-09-15 LAB — URINALYSIS, COMPLETE
Bacteria: NONE SEEN
Blood: NEGATIVE
Ketone: NEGATIVE
Leukocyte Esterase: NEGATIVE
Nitrite: NEGATIVE
Ph: 7 (ref 4.5–8.0)
RBC,UR: <1 /HPF (ref 0–5)
Squamous Epithelial: NONE SEEN
WBC UR: <1 /HPF (ref 0–5)

## 2011-09-15 LAB — DRUG SCREEN, URINE
Amphetamines, Ur Screen: NEGATIVE (ref ?–1000)
Cannabinoid 50 Ng, Ur ~~LOC~~: NEGATIVE (ref ?–50)
Cocaine Metabolite,Ur ~~LOC~~: POSITIVE (ref ?–300)
MDMA (Ecstasy)Ur Screen: NEGATIVE (ref ?–500)
Methadone, Ur Screen: NEGATIVE (ref ?–300)
Opiate, Ur Screen: NEGATIVE (ref ?–300)

## 2011-09-15 LAB — LIPASE, BLOOD: Lipase: 55 U/L — ABNORMAL LOW (ref 73–393)

## 2011-09-15 LAB — ETHANOL: Ethanol %: 0.01 % (ref 0.000–0.080)

## 2011-09-15 LAB — TSH: Thyroid Stimulating Horm: 0.19 u[IU]/mL — ABNORMAL LOW

## 2011-09-16 ENCOUNTER — Inpatient Hospital Stay: Payer: Self-pay | Admitting: Psychiatry

## 2011-09-17 LAB — BEHAVIORAL MEDICINE 1 PANEL
Albumin: 3.1 g/dL — ABNORMAL LOW (ref 3.4–5.0)
Anion Gap: 7 (ref 7–16)
BUN: 10 mg/dL (ref 7–18)
Basophil #: 0.1 10*3/uL (ref 0.0–0.1)
Basophil %: 1.2 %
Bilirubin,Total: 0.2 mg/dL (ref 0.2–1.0)
Calcium, Total: 8.8 mg/dL (ref 8.5–10.1)
Chloride: 106 mmol/L (ref 98–107)
Co2: 29 mmol/L (ref 21–32)
Creatinine: 1.24 mg/dL (ref 0.60–1.30)
EGFR (African American): >60
EGFR (Non-African Amer.): >60
Eosinophil #: 0.5 10*3/uL (ref 0.0–0.7)
Eosinophil %: 6.9 %
Lymphocyte #: 3.3 10*3/uL (ref 1.0–3.6)
Lymphocyte %: 47.4 %
MCH: 33.6 pg (ref 26.0–34.0)
Monocyte #: 0.8 10*3/uL — ABNORMAL HIGH (ref 0.0–0.7)
Monocyte %: 10.8 %
Neutrophil #: 2.4 10*3/uL (ref 1.4–6.5)
Neutrophil %: 33.7 %
Osmolality: 282 (ref 275–301)
Platelet: 292 10*3/uL (ref 150–440)
RBC: 3.31 10*6/uL — ABNORMAL LOW (ref 4.40–5.90)
RDW: 15.2 % — ABNORMAL HIGH (ref 11.5–14.5)
SGOT(AST): 13 U/L — ABNORMAL LOW (ref 15–37)
Sodium: 142 mmol/L (ref 136–145)
Thyroid Stimulating Horm: 0.751 u[IU]/mL
WBC: 7 10*3/uL (ref 3.8–10.6)

## 2011-09-19 LAB — CBC WITH DIFFERENTIAL/PLATELET
Basophil #: 0.1 10*3/uL (ref 0.0–0.1)
HCT: 33.5 % — ABNORMAL LOW (ref 40.0–52.0)
Lymphocyte #: 2.2 10*3/uL (ref 1.0–3.6)
Lymphocyte %: 32.4 %
MCH: 33.3 pg (ref 26.0–34.0)
MCV: 101 fL — ABNORMAL HIGH (ref 80–100)
Monocyte %: 13.8 %
Platelet: 258 10*3/uL (ref 150–440)
RBC: 3.33 10*6/uL — ABNORMAL LOW (ref 4.40–5.90)
RDW: 14.6 % — ABNORMAL HIGH (ref 11.5–14.5)

## 2011-09-21 LAB — CBC WITH DIFFERENTIAL/PLATELET
Basophil #: 0.1 10*3/uL (ref 0.0–0.1)
Basophil %: 1.2 %
Eosinophil %: 7.2 %
HCT: 32.8 % — ABNORMAL LOW (ref 40.0–52.0)
HGB: 10.9 g/dL — ABNORMAL LOW (ref 13.0–18.0)
Lymphocyte #: 2.3 10*3/uL (ref 1.0–3.6)
MCH: 33.6 pg (ref 26.0–34.0)
MCV: 102 fL — ABNORMAL HIGH (ref 80–100)
Monocyte #: 0.8 10*3/uL — ABNORMAL HIGH (ref 0.0–0.7)
Platelet: 230 10*3/uL (ref 150–440)
RBC: 3.23 10*6/uL — ABNORMAL LOW (ref 4.40–5.90)
WBC: 6.1 10*3/uL (ref 3.8–10.6)

## 2011-09-21 LAB — HEPATIC FUNCTION PANEL A (ARMC)
Alkaline Phosphatase: 91 U/L (ref 50–136)
Bilirubin, Direct: <0.1 mg/dL (ref 0.00–0.20)
SGPT (ALT): 12 U/L

## 2011-10-13 ENCOUNTER — Emergency Department: Payer: Self-pay | Admitting: Emergency Medicine

## 2011-12-16 ENCOUNTER — Observation Stay: Payer: Self-pay | Admitting: Internal Medicine

## 2011-12-16 DIAGNOSIS — I059 Rheumatic mitral valve disease, unspecified: Secondary | ICD-10-CM

## 2011-12-16 LAB — URINALYSIS, COMPLETE
Bacteria: NONE SEEN
Bilirubin,UR: NEGATIVE
Ketone: NEGATIVE
Nitrite: NEGATIVE
Ph: 6 (ref 4.5–8.0)
Protein: NEGATIVE
RBC,UR: 1 /HPF (ref 0–5)
Specific Gravity: 1.011 (ref 1.003–1.030)
WBC UR: <1 /HPF (ref 0–5)

## 2011-12-16 LAB — COMPREHENSIVE METABOLIC PANEL
Albumin: 3.1 g/dL — ABNORMAL LOW (ref 3.4–5.0)
BUN: 11 mg/dL (ref 7–18)
Calcium, Total: 8.3 mg/dL — ABNORMAL LOW (ref 8.5–10.1)
Chloride: 102 mmol/L (ref 98–107)
Co2: 29 mmol/L (ref 21–32)
Creatinine: 0.88 mg/dL (ref 0.60–1.30)
EGFR (African American): >60
EGFR (Non-African Amer.): >60
Potassium: 4 mmol/L (ref 3.5–5.1)
SGOT(AST): 14 U/L — ABNORMAL LOW (ref 15–37)
Sodium: 137 mmol/L (ref 136–145)

## 2011-12-16 LAB — CBC
HCT: 34.3 % — ABNORMAL LOW (ref 40.0–52.0)
HGB: 11.5 g/dL — ABNORMAL LOW (ref 13.0–18.0)
MCH: 33.5 pg (ref 26.0–34.0)
MCHC: 33.6 g/dL (ref 32.0–36.0)
Platelet: 344 10*3/uL (ref 150–440)
RBC: 3.44 10*6/uL — ABNORMAL LOW (ref 4.40–5.90)
WBC: 6.8 10*3/uL (ref 3.8–10.6)

## 2011-12-16 LAB — DRUG SCREEN, URINE
Amphetamines, Ur Screen: NEGATIVE (ref ?–1000)
Barbiturates, Ur Screen: POSITIVE (ref ?–200)
Cannabinoid 50 Ng, Ur ~~LOC~~: NEGATIVE (ref ?–50)
Cocaine Metabolite,Ur ~~LOC~~: NEGATIVE (ref ?–300)
Methadone, Ur Screen: NEGATIVE (ref ?–300)
Opiate, Ur Screen: POSITIVE (ref ?–300)
Phencyclidine (PCP) Ur S: NEGATIVE (ref ?–25)
Tricyclic, Ur Screen: NEGATIVE (ref ?–1000)

## 2011-12-16 LAB — CK TOTAL AND CKMB (NOT AT ARMC)
CK, Total: 64 U/L (ref 35–232)
CK-MB: 0.7 ng/mL (ref 0.5–3.6)

## 2011-12-16 LAB — TROPONIN I: Troponin-I: <0.02 ng/mL

## 2011-12-16 LAB — APTT: Activated PTT: 72.3 secs — ABNORMAL HIGH (ref 23.6–35.9)

## 2011-12-17 LAB — CBC WITH DIFFERENTIAL/PLATELET
Basophil #: 0.1 10*3/uL (ref 0.0–0.1)
Basophil %: 0.8 %
Eosinophil #: 0.4 10*3/uL (ref 0.0–0.7)
Eosinophil %: 6.3 %
Lymphocyte #: 2.5 10*3/uL (ref 1.0–3.6)
Lymphocyte %: 37.3 %
MCH: 33.1 pg (ref 26.0–34.0)
MCHC: 33.5 g/dL (ref 32.0–36.0)
Monocyte #: 0.8 10*3/uL — ABNORMAL HIGH (ref 0.0–0.7)
Platelet: 319 10*3/uL (ref 150–440)
RBC: 3.17 10*6/uL — ABNORMAL LOW (ref 4.40–5.90)
WBC: 6.7 10*3/uL (ref 3.8–10.6)

## 2011-12-17 LAB — BASIC METABOLIC PANEL
Anion Gap: 10 (ref 7–16)
Calcium, Total: 8.3 mg/dL — ABNORMAL LOW (ref 8.5–10.1)
Chloride: 105 mmol/L (ref 98–107)
Creatinine: 0.94 mg/dL (ref 0.60–1.30)
EGFR (African American): >60
Osmolality: 280 (ref 275–301)

## 2011-12-17 LAB — LIPID PANEL
Cholesterol: 103 mg/dL (ref 0–200)
HDL Cholesterol: 37 mg/dL — ABNORMAL LOW (ref 40–60)
Triglycerides: 91 mg/dL (ref 0–200)
VLDL Cholesterol, Calc: 18 mg/dL (ref 5–40)

## 2011-12-20 ENCOUNTER — Inpatient Hospital Stay: Payer: Self-pay | Admitting: Psychiatry

## 2011-12-20 LAB — DRUG SCREEN, URINE
Amphetamines, Ur Screen: NEGATIVE (ref ?–1000)
Benzodiazepine, Ur Scrn: NEGATIVE (ref ?–200)
Cocaine Metabolite,Ur ~~LOC~~: POSITIVE (ref ?–300)
MDMA (Ecstasy)Ur Screen: NEGATIVE (ref ?–500)
Methadone, Ur Screen: NEGATIVE (ref ?–300)
Opiate, Ur Screen: POSITIVE (ref ?–300)
Tricyclic, Ur Screen: NEGATIVE (ref ?–1000)

## 2011-12-20 LAB — COMPREHENSIVE METABOLIC PANEL
Alkaline Phosphatase: 144 U/L — ABNORMAL HIGH (ref 50–136)
Bilirubin,Total: 0.3 mg/dL (ref 0.2–1.0)
Calcium, Total: 9.4 mg/dL (ref 8.5–10.1)
Co2: 24 mmol/L (ref 21–32)
Osmolality: 279 (ref 275–301)
SGPT (ALT): 16 U/L
Total Protein: 9.1 g/dL — ABNORMAL HIGH (ref 6.4–8.2)

## 2011-12-20 LAB — CBC
HCT: 38.8 % — ABNORMAL LOW (ref 40.0–52.0)
HGB: 13.1 g/dL (ref 13.0–18.0)
MCH: 33.5 pg (ref 26.0–34.0)
MCHC: 33.8 g/dL (ref 32.0–36.0)
MCV: 99 fL (ref 80–100)
Platelet: 304 10*3/uL (ref 150–440)
RBC: 3.91 10*6/uL — ABNORMAL LOW (ref 4.40–5.90)
WBC: 8.7 10*3/uL (ref 3.8–10.6)

## 2011-12-20 LAB — URINALYSIS, COMPLETE
Blood: NEGATIVE
Glucose,UR: NEGATIVE mg/dL (ref 0–75)
Hyaline Cast: 9
Leukocyte Esterase: NEGATIVE
Nitrite: NEGATIVE
Ph: 6 (ref 4.5–8.0)
WBC UR: <1 /HPF (ref 0–5)

## 2011-12-20 LAB — TSH: Thyroid Stimulating Horm: 0.719 u[IU]/mL

## 2011-12-20 LAB — ETHANOL: Ethanol: <3 mg/dL

## 2011-12-20 LAB — TROPONIN I: Troponin-I: <0.02 ng/mL

## 2012-04-26 LAB — COMPREHENSIVE METABOLIC PANEL
Albumin: 3.2 g/dL — ABNORMAL LOW (ref 3.4–5.0)
Alkaline Phosphatase: 141 U/L — ABNORMAL HIGH (ref 50–136)
Anion Gap: 8 (ref 7–16)
Bilirubin,Total: 0.3 mg/dL (ref 0.2–1.0)
Calcium, Total: 8.7 mg/dL (ref 8.5–10.1)
Creatinine: 1.11 mg/dL (ref 0.60–1.30)
Glucose: 151 mg/dL — ABNORMAL HIGH (ref 65–99)
Osmolality: 276 (ref 275–301)
Potassium: 3.5 mmol/L (ref 3.5–5.1)
Sodium: 137 mmol/L (ref 136–145)
Total Protein: 7.7 g/dL (ref 6.4–8.2)

## 2012-04-26 LAB — CBC
HCT: 33.1 % — ABNORMAL LOW (ref 40.0–52.0)
MCH: 33.9 pg (ref 26.0–34.0)
MCV: 99 fL (ref 80–100)
RBC: 3.36 10*6/uL — ABNORMAL LOW (ref 4.40–5.90)
RDW: 14.3 % (ref 11.5–14.5)
WBC: 10.2 10*3/uL (ref 3.8–10.6)

## 2012-04-27 ENCOUNTER — Inpatient Hospital Stay: Payer: Self-pay | Admitting: Internal Medicine

## 2012-04-27 LAB — DRUG SCREEN, URINE
Amphetamines, Ur Screen: NEGATIVE (ref ?–1000)
Benzodiazepine, Ur Scrn: NEGATIVE (ref ?–200)
Cannabinoid 50 Ng, Ur ~~LOC~~: NEGATIVE (ref ?–50)
Methadone, Ur Screen: NEGATIVE (ref ?–300)
Phencyclidine (PCP) Ur S: NEGATIVE (ref ?–25)

## 2012-04-28 LAB — VANCOMYCIN, TROUGH: Vancomycin, Trough: 9 ug/mL — ABNORMAL LOW (ref 10–20)

## 2012-05-02 LAB — CULTURE, BLOOD (SINGLE)

## 2012-05-03 ENCOUNTER — Emergency Department: Payer: Self-pay | Admitting: *Deleted

## 2012-05-03 LAB — DRUG SCREEN, URINE
Amphetamines, Ur Screen: NEGATIVE (ref ?–1000)
Benzodiazepine, Ur Scrn: POSITIVE (ref ?–200)
MDMA (Ecstasy)Ur Screen: NEGATIVE (ref ?–500)
Methadone, Ur Screen: NEGATIVE (ref ?–300)
Phencyclidine (PCP) Ur S: NEGATIVE (ref ?–25)

## 2012-05-03 LAB — BASIC METABOLIC PANEL
BUN: 8 mg/dL (ref 7–18)
Chloride: 105 mmol/L (ref 98–107)
Creatinine: 1.02 mg/dL (ref 0.60–1.30)
Potassium: 4.2 mmol/L (ref 3.5–5.1)
Sodium: 139 mmol/L (ref 136–145)

## 2012-05-03 LAB — CBC
MCH: 33.9 pg (ref 26.0–34.0)
MCHC: 34.7 g/dL (ref 32.0–36.0)
MCV: 98 fL (ref 80–100)
Platelet: 414 10*3/uL (ref 150–440)
RDW: 13.9 % (ref 11.5–14.5)

## 2012-05-03 LAB — CK TOTAL AND CKMB (NOT AT ARMC)
CK, Total: 52 U/L (ref 35–232)
CK, Total: 39 U/L (ref 35–232)
CK-MB: 0.6 ng/mL (ref 0.5–3.6)

## 2012-05-03 LAB — TROPONIN I: Troponin-I: <0.02 ng/mL

## 2012-05-18 LAB — COMPREHENSIVE METABOLIC PANEL
Chloride: 99 mmol/L (ref 98–107)
Co2: 26 mmol/L (ref 21–32)
Osmolality: 278 (ref 275–301)
Potassium: 3.6 mmol/L (ref 3.5–5.1)
SGOT(AST): 200 U/L — ABNORMAL HIGH (ref 15–37)
SGPT (ALT): 98 U/L — ABNORMAL HIGH (ref 12–78)

## 2012-05-18 LAB — CBC WITH DIFFERENTIAL/PLATELET
Basophil #: 0.1 10*3/uL (ref 0.0–0.1)
Basophil %: 0.2 %
Eosinophil #: 0.1 10*3/uL (ref 0.0–0.7)
HCT: 31.5 % — ABNORMAL LOW (ref 40.0–52.0)
HGB: 10.5 g/dL — ABNORMAL LOW (ref 13.0–18.0)
Lymphocyte %: 1.5 %
MCH: 32.1 pg (ref 26.0–34.0)
MCHC: 33.2 g/dL (ref 32.0–36.0)
Monocyte #: 0.4 x10 3/mm (ref 0.2–1.0)
Neutrophil #: 26.1 10*3/uL — ABNORMAL HIGH (ref 1.4–6.5)
Neutrophil %: 96.6 %
RDW: 14.2 % (ref 11.5–14.5)

## 2012-05-18 LAB — CK TOTAL AND CKMB (NOT AT ARMC)
CK, Total: 60 U/L (ref 35–232)
CK-MB: <0.5 ng/mL — ABNORMAL LOW (ref 0.5–3.6)

## 2012-05-19 ENCOUNTER — Inpatient Hospital Stay: Payer: Self-pay | Admitting: Internal Medicine

## 2012-05-19 LAB — URINALYSIS, COMPLETE
Bacteria: NONE SEEN
Ketone: NEGATIVE
Nitrite: NEGATIVE
Ph: 6 (ref 4.5–8.0)
Protein: NEGATIVE
Specific Gravity: 1.002 (ref 1.003–1.030)
WBC UR: 1 /HPF (ref 0–5)

## 2012-05-19 LAB — DRUG SCREEN, URINE
Benzodiazepine, Ur Scrn: NEGATIVE (ref ?–200)
Cannabinoid 50 Ng, Ur ~~LOC~~: NEGATIVE (ref ?–50)
Cocaine Metabolite,Ur ~~LOC~~: NEGATIVE (ref ?–300)
MDMA (Ecstasy)Ur Screen: NEGATIVE (ref ?–500)
Opiate, Ur Screen: NEGATIVE (ref ?–300)
Phencyclidine (PCP) Ur S: NEGATIVE (ref ?–25)

## 2012-05-19 LAB — CK TOTAL AND CKMB (NOT AT ARMC)
CK, Total: 50 U/L (ref 35–232)
CK, Total: 153 U/L (ref 35–232)
CK-MB: 0.6 ng/mL (ref 0.5–3.6)
CK-MB: 0.9 ng/mL (ref 0.5–3.6)

## 2012-05-19 LAB — TROPONIN I: Troponin-I: <0.02 ng/mL

## 2012-05-20 LAB — LIPID PANEL
Cholesterol: 108 mg/dL (ref 0–200)
Ldl Cholesterol, Calc: 57 mg/dL (ref 0–100)
VLDL Cholesterol, Calc: 31 mg/dL (ref 5–40)

## 2012-05-20 LAB — COMPREHENSIVE METABOLIC PANEL
Anion Gap: 4 — ABNORMAL LOW (ref 7–16)
BUN: 15 mg/dL (ref 7–18)
Bilirubin,Total: 0.2 mg/dL (ref 0.2–1.0)
Chloride: 113 mmol/L — ABNORMAL HIGH (ref 98–107)
Creatinine: 1.31 mg/dL — ABNORMAL HIGH (ref 0.60–1.30)
EGFR (African American): >60
EGFR (Non-African Amer.): >60
Potassium: 3.9 mmol/L (ref 3.5–5.1)
Sodium: 143 mmol/L (ref 136–145)
Total Protein: 7 g/dL (ref 6.4–8.2)

## 2012-05-20 LAB — PROTIME-INR: Prothrombin Time: 14.1 secs (ref 11.5–14.7)

## 2012-05-20 LAB — CBC WITH DIFFERENTIAL/PLATELET
Basophil %: 0.9 %
Eosinophil %: 3.8 %
HGB: 10.9 g/dL — ABNORMAL LOW (ref 13.0–18.0)
Lymphocyte #: 2.9 10*3/uL (ref 1.0–3.6)
MCH: 32.4 pg (ref 26.0–34.0)
MCV: 98 fL (ref 80–100)
Monocyte #: 1 x10 3/mm (ref 0.2–1.0)
Neutrophil %: 76.8 %
RBC: 3.37 10*6/uL — ABNORMAL LOW (ref 4.40–5.90)

## 2012-05-20 LAB — HEMOGLOBIN A1C: Hemoglobin A1C: 6.4 % — ABNORMAL HIGH (ref 4.2–6.3)

## 2012-05-21 LAB — CBC WITH DIFFERENTIAL/PLATELET
Basophil #: 0.2 10*3/uL — ABNORMAL HIGH (ref 0.0–0.1)
Basophil %: 1.5 %
Eosinophil %: 6.1 %
Lymphocyte #: 2.1 10*3/uL (ref 1.0–3.6)
MCHC: 33.2 g/dL (ref 32.0–36.0)
MCV: 97 fL (ref 80–100)
Monocyte %: 4.9 %
Neutrophil %: 71.1 %
Platelet: 246 10*3/uL (ref 150–440)
RBC: 3.18 10*6/uL — ABNORMAL LOW (ref 4.40–5.90)
RDW: 14.7 % — ABNORMAL HIGH (ref 11.5–14.5)
WBC: 12.9 10*3/uL — ABNORMAL HIGH (ref 3.8–10.6)

## 2012-05-22 LAB — CREATININE, SERUM
Creatinine: 1.1 mg/dL (ref 0.60–1.30)
EGFR (Non-African Amer.): >60

## 2012-05-22 LAB — VANCOMYCIN, TROUGH: Vancomycin, Trough: 19 ug/mL (ref 10–20)

## 2012-05-24 LAB — CULTURE, BLOOD (SINGLE)

## 2012-06-18 ENCOUNTER — Emergency Department: Payer: Self-pay | Admitting: Internal Medicine

## 2012-06-18 LAB — BASIC METABOLIC PANEL
Anion Gap: 7 (ref 7–16)
BUN: 10 mg/dL (ref 7–18)
Chloride: 106 mmol/L (ref 98–107)
Co2: 27 mmol/L (ref 21–32)
Creatinine: 1.31 mg/dL — ABNORMAL HIGH (ref 0.60–1.30)
Osmolality: 278 (ref 275–301)
Potassium: 3.8 mmol/L (ref 3.5–5.1)

## 2012-06-18 LAB — CBC
HCT: 34.8 % — ABNORMAL LOW (ref 40.0–52.0)
MCHC: 34.2 g/dL (ref 32.0–36.0)
MCV: 96 fL (ref 80–100)
RBC: 3.62 10*6/uL — ABNORMAL LOW (ref 4.40–5.90)
WBC: 6.3 10*3/uL (ref 3.8–10.6)

## 2012-06-18 LAB — TROPONIN I
Troponin-I: <0.02 ng/mL
Troponin-I: <0.02 ng/mL

## 2012-06-18 LAB — CK TOTAL AND CKMB (NOT AT ARMC): CK, Total: 44 U/L (ref 35–232)

## 2012-07-13 ENCOUNTER — Emergency Department: Payer: Self-pay | Admitting: Unknown Physician Specialty

## 2012-08-30 ENCOUNTER — Emergency Department: Payer: Self-pay | Admitting: Emergency Medicine

## 2012-08-31 LAB — URINALYSIS, COMPLETE
Bacteria: NONE SEEN
Bilirubin,UR: NEGATIVE
Blood: NEGATIVE
Glucose,UR: NEGATIVE mg/dL (ref 0–75)
Leukocyte Esterase: NEGATIVE
Nitrite: NEGATIVE
Specific Gravity: 1.008 (ref 1.003–1.030)
Squamous Epithelial: NONE SEEN
WBC UR: <1 /HPF (ref 0–5)

## 2012-08-31 LAB — CBC
HCT: 39.3 % — ABNORMAL LOW (ref 40.0–52.0)
HGB: 12.9 g/dL — ABNORMAL LOW (ref 13.0–18.0)
MCH: 32.3 pg (ref 26.0–34.0)
MCHC: 32.8 g/dL (ref 32.0–36.0)
MCV: 98 fL (ref 80–100)
Platelet: 397 10*3/uL (ref 150–440)
RBC: 4 10*6/uL — ABNORMAL LOW (ref 4.40–5.90)

## 2012-08-31 LAB — COMPREHENSIVE METABOLIC PANEL
Albumin: 4.1 g/dL (ref 3.4–5.0)
Alkaline Phosphatase: 157 U/L — ABNORMAL HIGH (ref 50–136)
Anion Gap: 6 — ABNORMAL LOW (ref 7–16)
BUN: 11 mg/dL (ref 7–18)
Calcium, Total: 9.3 mg/dL (ref 8.5–10.1)
Creatinine: 0.96 mg/dL (ref 0.60–1.30)
EGFR (African American): >60
EGFR (Non-African Amer.): >60
Glucose: 93 mg/dL (ref 65–99)
Osmolality: 271 (ref 275–301)
Potassium: 4.6 mmol/L (ref 3.5–5.1)
SGPT (ALT): 28 U/L (ref 12–78)
Sodium: 136 mmol/L (ref 136–145)
Total Protein: 8.4 g/dL — ABNORMAL HIGH (ref 6.4–8.2)

## 2012-09-05 ENCOUNTER — Emergency Department: Payer: Self-pay | Admitting: Emergency Medicine

## 2012-09-05 LAB — URINALYSIS, COMPLETE
Bacteria: NONE SEEN
Bilirubin,UR: NEGATIVE
Ketone: NEGATIVE
Leukocyte Esterase: NEGATIVE
Nitrite: NEGATIVE
Protein: NEGATIVE
Specific Gravity: 1.014 (ref 1.003–1.030)
WBC UR: 1 /HPF (ref 0–5)

## 2012-09-05 LAB — VALPROIC ACID LEVEL: Valproic Acid: <3 ug/mL — ABNORMAL LOW

## 2012-09-05 LAB — DRUG SCREEN, URINE
Amphetamines, Ur Screen: NEGATIVE (ref ?–1000)
Barbiturates, Ur Screen: NEGATIVE (ref ?–200)
Cannabinoid 50 Ng, Ur ~~LOC~~: POSITIVE (ref ?–50)
Cocaine Metabolite,Ur ~~LOC~~: NEGATIVE (ref ?–300)
Phencyclidine (PCP) Ur S: NEGATIVE (ref ?–25)
Tricyclic, Ur Screen: NEGATIVE (ref ?–1000)

## 2012-09-05 LAB — COMPREHENSIVE METABOLIC PANEL
Albumin: 3.8 g/dL (ref 3.4–5.0)
Alkaline Phosphatase: 169 U/L — ABNORMAL HIGH (ref 50–136)
Calcium, Total: 9.1 mg/dL (ref 8.5–10.1)
Chloride: 108 mmol/L — ABNORMAL HIGH (ref 98–107)
Co2: 26 mmol/L (ref 21–32)
Creatinine: 0.94 mg/dL (ref 0.60–1.30)
EGFR (Non-African Amer.): >60
Glucose: 108 mg/dL — ABNORMAL HIGH (ref 65–99)
Osmolality: 279 (ref 275–301)
SGOT(AST): 23 U/L (ref 15–37)
SGPT (ALT): 31 U/L (ref 12–78)

## 2012-09-05 LAB — CBC
HCT: 36.1 % — ABNORMAL LOW (ref 40.0–52.0)
HGB: 12.5 g/dL — ABNORMAL LOW (ref 13.0–18.0)
MCH: 34.1 pg — ABNORMAL HIGH (ref 26.0–34.0)
MCHC: 34.7 g/dL (ref 32.0–36.0)
MCV: 98 fL (ref 80–100)
RBC: 3.67 10*6/uL — ABNORMAL LOW (ref 4.40–5.90)
RDW: 14.5 % (ref 11.5–14.5)

## 2012-09-05 LAB — ACETAMINOPHEN LEVEL: Acetaminophen: <2 ug/mL

## 2012-09-05 LAB — TSH: Thyroid Stimulating Horm: 0.25 u[IU]/mL — ABNORMAL LOW

## 2012-09-05 LAB — SALICYLATE LEVEL: Salicylates, Serum: 7.1 mg/dL — ABNORMAL HIGH

## 2012-09-05 LAB — ETHANOL: Ethanol: <3 mg/dL

## 2012-11-26 ENCOUNTER — Emergency Department: Payer: Self-pay | Admitting: Emergency Medicine

## 2012-11-26 LAB — COMPREHENSIVE METABOLIC PANEL
Albumin: 3.5 g/dL (ref 3.4–5.0)
Alkaline Phosphatase: 149 U/L — ABNORMAL HIGH (ref 50–136)
BUN: 8 mg/dL (ref 7–18)
Bilirubin,Total: 0.3 mg/dL (ref 0.2–1.0)
Calcium, Total: 8.8 mg/dL (ref 8.5–10.1)
Chloride: 105 mmol/L (ref 98–107)
EGFR (African American): >60
Glucose: 105 mg/dL — ABNORMAL HIGH (ref 65–99)
Osmolality: 271 (ref 275–301)
Potassium: 3.7 mmol/L (ref 3.5–5.1)
SGOT(AST): 11 U/L — ABNORMAL LOW (ref 15–37)
SGPT (ALT): 10 U/L — ABNORMAL LOW (ref 12–78)
Sodium: 136 mmol/L (ref 136–145)
Total Protein: 7.8 g/dL (ref 6.4–8.2)

## 2012-11-26 LAB — TROPONIN I: Troponin-I: <0.02 ng/mL

## 2012-11-26 LAB — CBC WITH DIFFERENTIAL/PLATELET
Basophil #: 0.1 10*3/uL (ref 0.0–0.1)
Basophil %: 1.4 %
Eosinophil #: 0.6 10*3/uL (ref 0.0–0.7)
Eosinophil %: 7.9 %
Lymphocyte %: 29.2 %
MCV: 96 fL (ref 80–100)
Neutrophil #: 3.9 10*3/uL (ref 1.4–6.5)
Neutrophil %: 53 %
WBC: 7.3 10*3/uL (ref 3.8–10.6)

## 2012-11-30 ENCOUNTER — Emergency Department: Payer: Self-pay

## 2012-11-30 LAB — COMPREHENSIVE METABOLIC PANEL
Alkaline Phosphatase: 131 U/L (ref 50–136)
BUN: 9 mg/dL (ref 7–18)
Calcium, Total: 8.6 mg/dL (ref 8.5–10.1)
Co2: 26 mmol/L (ref 21–32)
Glucose: 91 mg/dL (ref 65–99)
Osmolality: 272 (ref 275–301)
SGOT(AST): 18 U/L (ref 15–37)
SGPT (ALT): 15 U/L (ref 12–78)
Sodium: 137 mmol/L (ref 136–145)

## 2012-11-30 LAB — CBC
HGB: 11.5 g/dL — ABNORMAL LOW (ref 13.0–18.0)
MCV: 95 fL (ref 80–100)
RBC: 3.48 10*6/uL — ABNORMAL LOW (ref 4.40–5.90)
RDW: 15 % — ABNORMAL HIGH (ref 11.5–14.5)
WBC: 7.7 10*3/uL (ref 3.8–10.6)

## 2012-11-30 LAB — CK TOTAL AND CKMB (NOT AT ARMC): CK-MB: <0.5 ng/mL — ABNORMAL LOW (ref 0.5–3.6)

## 2012-12-01 LAB — DRUG SCREEN, URINE
Benzodiazepine, Ur Scrn: NEGATIVE (ref ?–200)
Cannabinoid 50 Ng, Ur ~~LOC~~: NEGATIVE (ref ?–50)
Cocaine Metabolite,Ur ~~LOC~~: NEGATIVE (ref ?–300)
Methadone, Ur Screen: NEGATIVE (ref ?–300)
Opiate, Ur Screen: NEGATIVE (ref ?–300)
Phencyclidine (PCP) Ur S: NEGATIVE (ref ?–25)
Tricyclic, Ur Screen: NEGATIVE (ref ?–1000)

## 2012-12-09 ENCOUNTER — Emergency Department: Payer: Self-pay | Admitting: Emergency Medicine

## 2012-12-09 LAB — COMPREHENSIVE METABOLIC PANEL
Albumin: 4 g/dL (ref 3.4–5.0)
Alkaline Phosphatase: 142 U/L — ABNORMAL HIGH (ref 50–136)
Anion Gap: 3 — ABNORMAL LOW (ref 7–16)
Calcium, Total: 9 mg/dL (ref 8.5–10.1)
Chloride: 103 mmol/L (ref 98–107)
Creatinine: 0.97 mg/dL (ref 0.60–1.30)
EGFR (African American): >60
EGFR (Non-African Amer.): >60
Glucose: 76 mg/dL (ref 65–99)
SGOT(AST): 22 U/L (ref 15–37)
Sodium: 137 mmol/L (ref 136–145)

## 2012-12-09 LAB — DRUG SCREEN, URINE
Barbiturates, Ur Screen: NEGATIVE (ref ?–200)
Cannabinoid 50 Ng, Ur ~~LOC~~: POSITIVE (ref ?–50)
MDMA (Ecstasy)Ur Screen: NEGATIVE (ref ?–500)
Methadone, Ur Screen: NEGATIVE (ref ?–300)
Tricyclic, Ur Screen: NEGATIVE (ref ?–1000)

## 2012-12-09 LAB — CBC
HCT: 34.7 % — ABNORMAL LOW (ref 40.0–52.0)
HGB: 11.8 g/dL — ABNORMAL LOW (ref 13.0–18.0)
MCH: 32.8 pg (ref 26.0–34.0)
MCHC: 34.2 g/dL (ref 32.0–36.0)
Platelet: 311 10*3/uL (ref 150–440)
WBC: 6.4 10*3/uL (ref 3.8–10.6)

## 2012-12-09 LAB — URINALYSIS, COMPLETE
Bacteria: NONE SEEN
Blood: NEGATIVE
Glucose,UR: NEGATIVE mg/dL (ref 0–75)
Ketone: NEGATIVE
Nitrite: NEGATIVE
Ph: 7 (ref 4.5–8.0)
Protein: NEGATIVE
RBC,UR: <1 /HPF (ref 0–5)
Specific Gravity: 1.009 (ref 1.003–1.030)
WBC UR: <1 /HPF (ref 0–5)

## 2012-12-09 LAB — CK TOTAL AND CKMB (NOT AT ARMC): CK, Total: 52 U/L (ref 35–232)

## 2012-12-09 LAB — TROPONIN I: Troponin-I: <0.02 ng/mL

## 2012-12-16 ENCOUNTER — Emergency Department: Payer: Self-pay | Admitting: Emergency Medicine

## 2012-12-17 LAB — CBC
HCT: 32.9 % — ABNORMAL LOW (ref 40.0–52.0)
HGB: 11.1 g/dL — ABNORMAL LOW (ref 13.0–18.0)
MCV: 96 fL (ref 80–100)
Platelet: 252 10*3/uL (ref 150–440)
RDW: 14.9 % — ABNORMAL HIGH (ref 11.5–14.5)
WBC: 11.8 10*3/uL — ABNORMAL HIGH (ref 3.8–10.6)

## 2012-12-17 LAB — COMPREHENSIVE METABOLIC PANEL
Albumin: 3.6 g/dL (ref 3.4–5.0)
Anion Gap: 7 (ref 7–16)
BUN: 14 mg/dL (ref 7–18)
Calcium, Total: 8.9 mg/dL (ref 8.5–10.1)
Chloride: 100 mmol/L (ref 98–107)
Co2: 27 mmol/L (ref 21–32)
Creatinine: 1.7 mg/dL — ABNORMAL HIGH (ref 0.60–1.30)
Potassium: 4.6 mmol/L (ref 3.5–5.1)
SGOT(AST): 33 U/L (ref 15–37)
Total Protein: 7.6 g/dL (ref 6.4–8.2)

## 2012-12-17 LAB — CK TOTAL AND CKMB (NOT AT ARMC)
CK, Total: 29 U/L — ABNORMAL LOW (ref 35–232)
CK-MB: <0.5 ng/mL — ABNORMAL LOW (ref 0.5–3.6)

## 2012-12-17 LAB — TROPONIN I: Troponin-I: <0.02 ng/mL

## 2013-01-03 ENCOUNTER — Emergency Department: Payer: Self-pay | Admitting: Emergency Medicine

## 2013-01-24 ENCOUNTER — Inpatient Hospital Stay: Payer: Self-pay | Admitting: Internal Medicine

## 2013-01-24 ENCOUNTER — Ambulatory Visit: Payer: Self-pay | Admitting: Neurology

## 2013-01-24 LAB — CBC WITH DIFFERENTIAL/PLATELET
Basophil #: 0.1 10*3/uL (ref 0.0–0.1)
Basophil %: 1.2 %
Eosinophil #: 0.3 10*3/uL (ref 0.0–0.7)
Eosinophil %: 3.7 %
HCT: 29.3 % — ABNORMAL LOW (ref 40.0–52.0)
HGB: 10.1 g/dL — ABNORMAL LOW (ref 13.0–18.0)
Lymphocyte #: 2.3 10*3/uL (ref 1.0–3.6)
Lymphocyte %: 27.1 %
MCH: 32.1 pg (ref 26.0–34.0)
MCHC: 34.4 g/dL (ref 32.0–36.0)
Monocyte #: 1 x10 3/mm (ref 0.2–1.0)
Monocyte %: 11.4 %
Neutrophil #: 4.8 10*3/uL (ref 1.4–6.5)
Neutrophil %: 56.6 %
Platelet: 329 10*3/uL (ref 150–440)
WBC: 8.5 10*3/uL (ref 3.8–10.6)

## 2013-01-24 LAB — DRUG SCREEN, URINE
Barbiturates, Ur Screen: NEGATIVE (ref ?–200)
Benzodiazepine, Ur Scrn: NEGATIVE (ref ?–200)
Cannabinoid 50 Ng, Ur ~~LOC~~: POSITIVE (ref ?–50)
Cocaine Metabolite,Ur ~~LOC~~: POSITIVE (ref ?–300)
Tricyclic, Ur Screen: NEGATIVE (ref ?–1000)

## 2013-01-24 LAB — COMPREHENSIVE METABOLIC PANEL
Alkaline Phosphatase: 228 U/L — ABNORMAL HIGH (ref 50–136)
Anion Gap: 10 (ref 7–16)
Bilirubin,Total: 0.2 mg/dL (ref 0.2–1.0)
Calcium, Total: 8.7 mg/dL (ref 8.5–10.1)
Co2: 24 mmol/L (ref 21–32)
EGFR (Non-African Amer.): >60
Glucose: 104 mg/dL — ABNORMAL HIGH (ref 65–99)
SGPT (ALT): 31 U/L (ref 12–78)
Sodium: 138 mmol/L (ref 136–145)

## 2013-01-24 LAB — ETHANOL
Ethanol %: 0.01 % (ref 0.000–0.080)
Ethanol: 10 mg/dL

## 2013-01-25 LAB — BASIC METABOLIC PANEL
Anion Gap: 4 — ABNORMAL LOW (ref 7–16)
BUN: 10 mg/dL (ref 7–18)
Chloride: 110 mmol/L — ABNORMAL HIGH (ref 98–107)
Creatinine: 0.96 mg/dL (ref 0.60–1.30)
Glucose: 102 mg/dL — ABNORMAL HIGH (ref 65–99)
Osmolality: 277 (ref 275–301)
Sodium: 139 mmol/L (ref 136–145)

## 2013-01-25 LAB — CBC WITH DIFFERENTIAL/PLATELET
Basophil #: 0 10*3/uL (ref 0.0–0.1)
Basophil %: 0.6 %
Eosinophil %: 8.3 %
HCT: 28.9 % — ABNORMAL LOW (ref 40.0–52.0)
HGB: 9.9 g/dL — ABNORMAL LOW (ref 13.0–18.0)
Lymphocyte #: 2.7 10*3/uL (ref 1.0–3.6)
Lymphocyte %: 51.2 %
MCV: 94 fL (ref 80–100)
Monocyte #: 0.6 x10 3/mm (ref 0.2–1.0)
Monocyte %: 11.3 %
Platelet: 322 10*3/uL (ref 150–440)
RBC: 3.09 10*6/uL — ABNORMAL LOW (ref 4.40–5.90)
RDW: 15.9 % — ABNORMAL HIGH (ref 11.5–14.5)

## 2013-01-25 LAB — LIPID PANEL
HDL Cholesterol: 25 mg/dL — ABNORMAL LOW (ref 40–60)
Ldl Cholesterol, Calc: 52 mg/dL (ref 0–100)
Triglycerides: 160 mg/dL (ref 0–200)
VLDL Cholesterol, Calc: 32 mg/dL (ref 5–40)

## 2013-01-25 LAB — MAGNESIUM: Magnesium: 2 mg/dL

## 2013-01-25 LAB — TSH: Thyroid Stimulating Horm: 1.69 u[IU]/mL

## 2013-02-05 ENCOUNTER — Emergency Department: Payer: Self-pay | Admitting: Emergency Medicine

## 2013-02-05 LAB — TROPONIN I
Troponin-I: <0.02 ng/mL
Troponin-I: <0.02 ng/mL

## 2013-02-05 LAB — CBC WITH DIFFERENTIAL/PLATELET
Basophil #: 0 10*3/uL (ref 0.0–0.1)
Basophil %: 0.6 %
Eosinophil #: 0.3 10*3/uL (ref 0.0–0.7)
Eosinophil %: 4.6 %
HGB: 11.5 g/dL — ABNORMAL LOW (ref 13.0–18.0)
Lymphocyte #: 1.9 10*3/uL (ref 1.0–3.6)
Lymphocyte %: 30 %
MCH: 32.3 pg (ref 26.0–34.0)
MCV: 93 fL (ref 80–100)
Monocyte #: 0.5 x10 3/mm (ref 0.2–1.0)
Neutrophil #: 3.7 10*3/uL (ref 1.4–6.5)
Neutrophil %: 57.3 %
Platelet: 328 10*3/uL (ref 150–440)
RBC: 3.56 10*6/uL — ABNORMAL LOW (ref 4.40–5.90)
RDW: 15.6 % — ABNORMAL HIGH (ref 11.5–14.5)
WBC: 6.4 10*3/uL (ref 3.8–10.6)

## 2013-02-05 LAB — COMPREHENSIVE METABOLIC PANEL
Albumin: 3.4 g/dL (ref 3.4–5.0)
Alkaline Phosphatase: 164 U/L — ABNORMAL HIGH (ref 50–136)
BUN: 5 mg/dL — ABNORMAL LOW (ref 7–18)
Bilirubin,Total: 0.1 mg/dL — ABNORMAL LOW (ref 0.2–1.0)
Calcium, Total: 8.6 mg/dL (ref 8.5–10.1)
Co2: 24 mmol/L (ref 21–32)
EGFR (African American): >60
EGFR (Non-African Amer.): >60
Osmolality: 275 (ref 275–301)
SGOT(AST): 24 U/L (ref 15–37)
SGPT (ALT): 15 U/L (ref 12–78)
Sodium: 139 mmol/L (ref 136–145)

## 2013-02-05 LAB — URINALYSIS, COMPLETE
Bacteria: NONE SEEN
Bilirubin,UR: NEGATIVE
Blood: NEGATIVE
Hyaline Cast: 4
Ketone: NEGATIVE
Ph: 6 (ref 4.5–8.0)
Protein: NEGATIVE
Specific Gravity: 1.006 (ref 1.003–1.030)
Squamous Epithelial: NONE SEEN
WBC UR: 1 /HPF (ref 0–5)

## 2013-02-05 LAB — ETHANOL: Ethanol: 99 mg/dL

## 2013-02-05 LAB — DRUG SCREEN, URINE
Amphetamines, Ur Screen: NEGATIVE (ref ?–1000)
Benzodiazepine, Ur Scrn: NEGATIVE (ref ?–200)
Cannabinoid 50 Ng, Ur ~~LOC~~: POSITIVE (ref ?–50)
Cocaine Metabolite,Ur ~~LOC~~: POSITIVE (ref ?–300)
Phencyclidine (PCP) Ur S: NEGATIVE (ref ?–25)
Tricyclic, Ur Screen: NEGATIVE (ref ?–1000)

## 2013-02-05 LAB — CK TOTAL AND CKMB (NOT AT ARMC): CK-MB: 0.9 ng/mL (ref 0.5–3.6)

## 2013-06-19 ENCOUNTER — Emergency Department: Payer: Self-pay | Admitting: Emergency Medicine

## 2013-06-19 LAB — CBC
HGB: 11.4 g/dL — ABNORMAL LOW (ref 13.0–18.0)
MCH: 32.7 pg (ref 26.0–34.0)
MCHC: 34 g/dL (ref 32.0–36.0)
MCV: 96 fL (ref 80–100)
Platelet: 283 10*3/uL (ref 150–440)
RBC: 3.48 10*6/uL — ABNORMAL LOW (ref 4.40–5.90)
WBC: 6.9 10*3/uL (ref 3.8–10.6)

## 2013-06-19 LAB — URINALYSIS, COMPLETE
Bacteria: NONE SEEN
Bilirubin,UR: NEGATIVE
Glucose,UR: NEGATIVE mg/dL (ref 0–75)
Ketone: NEGATIVE
WBC UR: 1 /HPF (ref 0–5)

## 2013-06-19 LAB — COMPREHENSIVE METABOLIC PANEL
Alkaline Phosphatase: 149 U/L — ABNORMAL HIGH (ref 50–136)
BUN: 8 mg/dL (ref 7–18)
Bilirubin,Total: 0.3 mg/dL (ref 0.2–1.0)
Calcium, Total: 8.9 mg/dL (ref 8.5–10.1)
Chloride: 110 mmol/L — ABNORMAL HIGH (ref 98–107)
Creatinine: 1.12 mg/dL (ref 0.60–1.30)
EGFR (African American): >60
Glucose: 93 mg/dL (ref 65–99)
SGOT(AST): 19 U/L (ref 15–37)
SGPT (ALT): 17 U/L (ref 12–78)
Sodium: 140 mmol/L (ref 136–145)
Total Protein: 7.3 g/dL (ref 6.4–8.2)

## 2013-06-19 LAB — TROPONIN I: Troponin-I: <0.02 ng/mL

## 2013-06-19 LAB — DRUG SCREEN, URINE
Amphetamines, Ur Screen: NEGATIVE (ref ?–1000)
Benzodiazepine, Ur Scrn: NEGATIVE (ref ?–200)
Cannabinoid 50 Ng, Ur ~~LOC~~: POSITIVE (ref ?–50)
Cocaine Metabolite,Ur ~~LOC~~: POSITIVE (ref ?–300)
Methadone, Ur Screen: NEGATIVE (ref ?–300)
Opiate, Ur Screen: NEGATIVE (ref ?–300)
Phencyclidine (PCP) Ur S: NEGATIVE (ref ?–25)
Tricyclic, Ur Screen: NEGATIVE (ref ?–1000)

## 2013-07-06 LAB — CBC WITH DIFFERENTIAL/PLATELET
Eosinophil #: 0.4 10*3/uL (ref 0.0–0.7)
HCT: 32.7 % — ABNORMAL LOW (ref 40.0–52.0)
HGB: 11.2 g/dL — ABNORMAL LOW (ref 13.0–18.0)
Lymphocyte #: 2.6 10*3/uL (ref 1.0–3.6)
Lymphocyte %: 44.9 %
MCH: 33.4 pg (ref 26.0–34.0)
MCV: 97 fL (ref 80–100)
Monocyte #: 0.5 x10 3/mm (ref 0.2–1.0)
Monocyte %: 8.2 %
Neutrophil #: 2.2 10*3/uL (ref 1.4–6.5)
Neutrophil %: 38.2 %
Platelet: 223 10*3/uL (ref 150–440)
RBC: 3.36 10*6/uL — ABNORMAL LOW (ref 4.40–5.90)
RDW: 15 % — ABNORMAL HIGH (ref 11.5–14.5)
WBC: 5.7 10*3/uL (ref 3.8–10.6)

## 2013-07-06 LAB — COMPREHENSIVE METABOLIC PANEL
Albumin: 3.3 g/dL — ABNORMAL LOW (ref 3.4–5.0)
Alkaline Phosphatase: 127 U/L (ref 50–136)
Bilirubin,Total: 0.1 mg/dL — ABNORMAL LOW (ref 0.2–1.0)
Calcium, Total: 8.6 mg/dL (ref 8.5–10.1)
Chloride: 107 mmol/L (ref 98–107)
Co2: 27 mmol/L (ref 21–32)
Creatinine: 1.33 mg/dL — ABNORMAL HIGH (ref 0.60–1.30)
EGFR (African American): >60
Glucose: 111 mg/dL — ABNORMAL HIGH (ref 65–99)
Osmolality: 277 (ref 275–301)
Total Protein: 6.8 g/dL (ref 6.4–8.2)

## 2013-07-07 ENCOUNTER — Observation Stay: Payer: Self-pay | Admitting: Internal Medicine

## 2013-07-07 DIAGNOSIS — I059 Rheumatic mitral valve disease, unspecified: Secondary | ICD-10-CM

## 2013-07-07 LAB — CK TOTAL AND CKMB (NOT AT ARMC)
CK, Total: 60 U/L (ref 35–232)
CK, Total: 59 U/L (ref 35–232)
CK, Total: 109 U/L (ref 35–232)
CK-MB: 0.8 ng/mL (ref 0.5–3.6)
CK-MB: 1 ng/mL (ref 0.5–3.6)
CK-MB: 1 ng/mL (ref 0.5–3.6)

## 2013-07-07 LAB — DRUG SCREEN, URINE
Cannabinoid 50 Ng, Ur ~~LOC~~: POSITIVE (ref ?–50)
Cocaine Metabolite,Ur ~~LOC~~: NEGATIVE (ref ?–300)
Methadone, Ur Screen: NEGATIVE (ref ?–300)
Phencyclidine (PCP) Ur S: NEGATIVE (ref ?–25)

## 2013-07-07 LAB — TROPONIN I
Troponin-I: <0.02 ng/mL
Troponin-I: <0.02 ng/mL

## 2013-08-07 ENCOUNTER — Emergency Department: Payer: Self-pay | Admitting: Emergency Medicine

## 2013-08-07 LAB — COMPREHENSIVE METABOLIC PANEL
Albumin: 3.3 g/dL — ABNORMAL LOW (ref 3.4–5.0)
Alkaline Phosphatase: 228 U/L — ABNORMAL HIGH
Anion Gap: 7 (ref 7–16)
BUN: 6 mg/dL — ABNORMAL LOW (ref 7–18)
Bilirubin,Total: 0.2 mg/dL (ref 0.2–1.0)
Calcium, Total: 8.7 mg/dL (ref 8.5–10.1)
Chloride: 103 mmol/L (ref 98–107)
EGFR (African American): >60
EGFR (Non-African Amer.): >60
Glucose: 109 mg/dL — ABNORMAL HIGH (ref 65–99)
Osmolality: 270 (ref 275–301)
SGPT (ALT): 24 U/L (ref 12–78)
Sodium: 136 mmol/L (ref 136–145)
Total Protein: 7.7 g/dL (ref 6.4–8.2)

## 2013-08-07 LAB — CBC
HCT: 37.1 % — ABNORMAL LOW (ref 40.0–52.0)
MCH: 32.2 pg (ref 26.0–34.0)
MCHC: 34.4 g/dL (ref 32.0–36.0)
MCV: 94 fL (ref 80–100)
RBC: 3.96 10*6/uL — ABNORMAL LOW (ref 4.40–5.90)
WBC: 6.4 10*3/uL (ref 3.8–10.6)

## 2013-08-07 LAB — CK TOTAL AND CKMB (NOT AT ARMC)
CK, Total: 54 U/L (ref 35–232)
CK, Total: 195 U/L (ref 35–232)
CK-MB: 1.9 ng/mL (ref 0.5–3.6)

## 2013-08-07 LAB — TROPONIN I: Troponin-I: <0.02 ng/mL

## 2013-09-05 ENCOUNTER — Inpatient Hospital Stay: Payer: Self-pay | Admitting: Internal Medicine

## 2013-09-05 LAB — URINALYSIS, COMPLETE
Bacteria: NONE SEEN
Blood: NEGATIVE
Ketone: NEGATIVE
Ph: 6 (ref 4.5–8.0)
RBC,UR: <1 /HPF (ref 0–5)
Specific Gravity: 1.003 (ref 1.003–1.030)
Squamous Epithelial: NONE SEEN
WBC UR: <1 /HPF (ref 0–5)

## 2013-09-05 LAB — CBC
HCT: 40.3 % (ref 40.0–52.0)
HGB: 13.6 g/dL (ref 13.0–18.0)
MCH: 32.2 pg (ref 26.0–34.0)
MCHC: 33.8 g/dL (ref 32.0–36.0)
Platelet: 234 10*3/uL (ref 150–440)
RBC: 4.24 10*6/uL — ABNORMAL LOW (ref 4.40–5.90)
RDW: 15.9 % — ABNORMAL HIGH (ref 11.5–14.5)
WBC: 7.1 10*3/uL (ref 3.8–10.6)

## 2013-09-05 LAB — DRUG SCREEN, URINE
Amphetamines, Ur Screen: NEGATIVE (ref ?–1000)
Barbiturates, Ur Screen: NEGATIVE (ref ?–200)
Benzodiazepine, Ur Scrn: NEGATIVE (ref ?–200)
Cocaine Metabolite,Ur ~~LOC~~: NEGATIVE (ref ?–300)
Phencyclidine (PCP) Ur S: NEGATIVE (ref ?–25)

## 2013-09-05 LAB — COMPREHENSIVE METABOLIC PANEL
Albumin: 3.5 g/dL (ref 3.4–5.0)
Alkaline Phosphatase: 175 U/L — ABNORMAL HIGH
Anion Gap: 7 (ref 7–16)
BUN: 7 mg/dL (ref 7–18)
Bilirubin,Total: 0.2 mg/dL (ref 0.2–1.0)
Calcium, Total: 9.5 mg/dL (ref 8.5–10.1)
Chloride: 105 mmol/L (ref 98–107)
Creatinine: 0.99 mg/dL (ref 0.60–1.30)
EGFR (African American): >60
EGFR (Non-African Amer.): >60
Glucose: 81 mg/dL (ref 65–99)
Osmolality: 265 (ref 275–301)
Potassium: 4.5 mmol/L (ref 3.5–5.1)
SGOT(AST): 31 U/L (ref 15–37)
SGPT (ALT): 20 U/L (ref 12–78)
Sodium: 134 mmol/L — ABNORMAL LOW (ref 136–145)

## 2013-09-05 LAB — PROTIME-INR: INR: 1

## 2013-09-05 LAB — CK TOTAL AND CKMB (NOT AT ARMC): CK, Total: 39 U/L (ref 35–232)

## 2013-09-06 LAB — BASIC METABOLIC PANEL
BUN: 6 mg/dL — ABNORMAL LOW (ref 7–18)
Calcium, Total: 9.1 mg/dL (ref 8.5–10.1)
Chloride: 104 mmol/L (ref 98–107)
Creatinine: 1.06 mg/dL (ref 0.60–1.30)
EGFR (African American): >60
Glucose: 85 mg/dL (ref 65–99)
Osmolality: 271 (ref 275–301)
Potassium: 3.8 mmol/L (ref 3.5–5.1)

## 2013-09-06 LAB — LIPID PANEL
HDL Cholesterol: 36 mg/dL — ABNORMAL LOW (ref 40–60)
Triglycerides: 108 mg/dL (ref 0–200)
VLDL Cholesterol, Calc: 22 mg/dL (ref 5–40)

## 2013-09-06 LAB — CBC WITH DIFFERENTIAL/PLATELET
Basophil #: 0.1 10*3/uL (ref 0.0–0.1)
Basophil %: 1.3 %
Eosinophil #: 0.5 10*3/uL (ref 0.0–0.7)
HCT: 35.1 % — ABNORMAL LOW (ref 40.0–52.0)
HGB: 11.7 g/dL — ABNORMAL LOW (ref 13.0–18.0)
Lymphocyte %: 50.3 %
Monocyte %: 7.9 %
RBC: 3.76 10*6/uL — ABNORMAL LOW (ref 4.40–5.90)

## 2013-09-06 LAB — APTT
Activated PTT: >160 secs (ref 23.6–35.9)
Activated PTT: 77.6 secs — ABNORMAL HIGH (ref 23.6–35.9)

## 2013-09-06 LAB — CK TOTAL AND CKMB (NOT AT ARMC)
CK, Total: 38 U/L (ref 35–232)
CK, Total: 40 U/L (ref 35–232)
CK-MB: <0.5 ng/mL — ABNORMAL LOW (ref 0.5–3.6)

## 2013-09-06 LAB — TROPONIN I: Troponin-I: <0.02 ng/mL

## 2013-11-13 ENCOUNTER — Emergency Department: Payer: Self-pay | Admitting: Emergency Medicine

## 2013-11-13 LAB — BASIC METABOLIC PANEL
ANION GAP: 2 — AB (ref 7–16)
BUN: 13 mg/dL (ref 7–18)
CHLORIDE: 107 mmol/L (ref 98–107)
Calcium, Total: 8.8 mg/dL (ref 8.5–10.1)
Co2: 28 mmol/L (ref 21–32)
Creatinine: 1.13 mg/dL (ref 0.60–1.30)
Glucose: 74 mg/dL (ref 65–99)
OSMOLALITY: 273 (ref 275–301)
Potassium: 3.9 mmol/L (ref 3.5–5.1)
Sodium: 137 mmol/L (ref 136–145)

## 2013-11-13 LAB — ETHANOL
Ethanol %: <0.003 % (ref 0.000–0.080)
Ethanol: <3 mg/dL

## 2013-11-13 LAB — TROPONIN I

## 2013-11-13 LAB — CBC
HCT: 36.4 % — ABNORMAL LOW (ref 40.0–52.0)
HGB: 12 g/dL — ABNORMAL LOW (ref 13.0–18.0)
MCH: 33 pg (ref 26.0–34.0)
MCHC: 33 g/dL (ref 32.0–36.0)
MCV: 100 fL (ref 80–100)
Platelet: 252 10*3/uL (ref 150–440)
RBC: 3.64 10*6/uL — ABNORMAL LOW (ref 4.40–5.90)
RDW: 16.5 % — ABNORMAL HIGH (ref 11.5–14.5)
WBC: 8.6 10*3/uL (ref 3.8–10.6)

## 2013-11-21 ENCOUNTER — Emergency Department: Payer: Self-pay | Admitting: Emergency Medicine

## 2013-11-23 ENCOUNTER — Emergency Department: Payer: Self-pay | Admitting: Emergency Medicine

## 2013-11-30 ENCOUNTER — Inpatient Hospital Stay: Payer: Self-pay | Admitting: Psychiatry

## 2013-11-30 LAB — DRUG SCREEN, URINE
AMPHETAMINES, UR SCREEN: NEGATIVE (ref ?–1000)
Barbiturates, Ur Screen: NEGATIVE (ref ?–200)
Benzodiazepine, Ur Scrn: NEGATIVE (ref ?–200)
CANNABINOID 50 NG, UR ~~LOC~~: NEGATIVE (ref ?–50)
Cocaine Metabolite,Ur ~~LOC~~: POSITIVE (ref ?–300)
MDMA (ECSTASY) UR SCREEN: NEGATIVE (ref ?–500)
Methadone, Ur Screen: NEGATIVE (ref ?–300)
Opiate, Ur Screen: POSITIVE (ref ?–300)
Phencyclidine (PCP) Ur S: NEGATIVE (ref ?–25)
Tricyclic, Ur Screen: NEGATIVE (ref ?–1000)

## 2013-11-30 LAB — ETHANOL
Ethanol %: <0.003 % (ref 0.000–0.080)
Ethanol: <3 mg/dL

## 2013-11-30 LAB — URINALYSIS, COMPLETE
BILIRUBIN, UR: NEGATIVE
Bacteria: NONE SEEN
Blood: NEGATIVE
Glucose,UR: NEGATIVE mg/dL (ref 0–75)
Hyaline Cast: 10
KETONE: NEGATIVE
LEUKOCYTE ESTERASE: NEGATIVE
Nitrite: NEGATIVE
Ph: 6 (ref 4.5–8.0)
Protein: NEGATIVE
SQUAMOUS EPITHELIAL: NONE SEEN
Specific Gravity: 1.011 (ref 1.003–1.030)
WBC UR: NONE SEEN /HPF (ref 0–5)

## 2013-11-30 LAB — COMPREHENSIVE METABOLIC PANEL
ANION GAP: 4 — AB (ref 7–16)
Albumin: 3.7 g/dL (ref 3.4–5.0)
Alkaline Phosphatase: 194 U/L — ABNORMAL HIGH
BUN: 14 mg/dL (ref 7–18)
Bilirubin,Total: 0.3 mg/dL (ref 0.2–1.0)
CALCIUM: 8.6 mg/dL (ref 8.5–10.1)
CREATININE: 1.54 mg/dL — AB (ref 0.60–1.30)
Chloride: 98 mmol/L (ref 98–107)
Co2: 30 mmol/L (ref 21–32)
EGFR (African American): >60
GFR CALC NON AF AMER: 55 — AB
Glucose: 81 mg/dL (ref 65–99)
OSMOLALITY: 264 (ref 275–301)
Potassium: 3.8 mmol/L (ref 3.5–5.1)
SGOT(AST): 19 U/L (ref 15–37)
SGPT (ALT): 17 U/L (ref 12–78)
Sodium: 132 mmol/L — ABNORMAL LOW (ref 136–145)
TOTAL PROTEIN: 8.4 g/dL — AB (ref 6.4–8.2)

## 2013-11-30 LAB — TROPONIN I: Troponin-I: <0.02 ng/mL

## 2013-11-30 LAB — CBC
HCT: 41.1 % (ref 40.0–52.0)
HGB: 13.1 g/dL (ref 13.0–18.0)
MCH: 32.4 pg (ref 26.0–34.0)
MCHC: 32 g/dL (ref 32.0–36.0)
MCV: 101 fL — ABNORMAL HIGH (ref 80–100)
Platelet: 305 10*3/uL (ref 150–440)
RBC: 4.06 10*6/uL — ABNORMAL LOW (ref 4.40–5.90)
RDW: 15.5 % — ABNORMAL HIGH (ref 11.5–14.5)
WBC: 8.8 10*3/uL (ref 3.8–10.6)

## 2013-11-30 LAB — ACETAMINOPHEN LEVEL

## 2013-11-30 LAB — SALICYLATE LEVEL: Salicylates, Serum: 4.3 mg/dL — ABNORMAL HIGH

## 2013-12-02 LAB — CARBAMAZEPINE LEVEL, TOTAL: Carbamazepine: 5 ug/mL (ref 4.0–12.0)

## 2013-12-20 ENCOUNTER — Emergency Department: Payer: Self-pay | Admitting: Emergency Medicine

## 2013-12-20 LAB — URINALYSIS, COMPLETE
BACTERIA: NONE SEEN
Bilirubin,UR: NEGATIVE
Blood: NEGATIVE
Glucose,UR: NEGATIVE mg/dL (ref 0–75)
Ketone: NEGATIVE
LEUKOCYTE ESTERASE: NEGATIVE
Nitrite: NEGATIVE
PH: 6 (ref 4.5–8.0)
Protein: NEGATIVE
RBC,UR: NONE SEEN /HPF (ref 0–5)
Specific Gravity: 1.006 (ref 1.003–1.030)
Squamous Epithelial: NONE SEEN
WBC UR: 1 /HPF (ref 0–5)

## 2013-12-20 LAB — COMPREHENSIVE METABOLIC PANEL
Albumin: 3.5 g/dL (ref 3.4–5.0)
Alkaline Phosphatase: 147 U/L — ABNORMAL HIGH
Anion Gap: 5 — ABNORMAL LOW (ref 7–16)
BUN: 11 mg/dL (ref 7–18)
Bilirubin,Total: 0.2 mg/dL (ref 0.2–1.0)
Calcium, Total: 8.2 mg/dL — ABNORMAL LOW (ref 8.5–10.1)
Chloride: 103 mmol/L (ref 98–107)
Co2: 27 mmol/L (ref 21–32)
Creatinine: 1.2 mg/dL (ref 0.60–1.30)
EGFR (African American): >60
EGFR (Non-African Amer.): >60
Glucose: 86 mg/dL (ref 65–99)
OSMOLALITY: 269 (ref 275–301)
Potassium: 3.7 mmol/L (ref 3.5–5.1)
SGOT(AST): 27 U/L (ref 15–37)
SGPT (ALT): 26 U/L (ref 12–78)
Sodium: 135 mmol/L — ABNORMAL LOW (ref 136–145)
Total Protein: 7.6 g/dL (ref 6.4–8.2)

## 2013-12-20 LAB — SALICYLATE LEVEL: SALICYLATES, SERUM: 5.2 mg/dL — AB

## 2013-12-20 LAB — DRUG SCREEN, URINE
AMPHETAMINES, UR SCREEN: NEGATIVE (ref ?–1000)
BENZODIAZEPINE, UR SCRN: NEGATIVE (ref ?–200)
Barbiturates, Ur Screen: NEGATIVE (ref ?–200)
Cannabinoid 50 Ng, Ur ~~LOC~~: POSITIVE (ref ?–50)
Cocaine Metabolite,Ur ~~LOC~~: NEGATIVE (ref ?–300)
MDMA (Ecstasy)Ur Screen: POSITIVE (ref ?–500)
METHADONE, UR SCREEN: NEGATIVE (ref ?–300)
Opiate, Ur Screen: NEGATIVE (ref ?–300)
Phencyclidine (PCP) Ur S: NEGATIVE (ref ?–25)
Tricyclic, Ur Screen: NEGATIVE (ref ?–1000)

## 2013-12-20 LAB — CBC
HCT: 37 % — ABNORMAL LOW (ref 40.0–52.0)
HGB: 12.7 g/dL — AB (ref 13.0–18.0)
MCH: 34.7 pg — ABNORMAL HIGH (ref 26.0–34.0)
MCHC: 34.3 g/dL (ref 32.0–36.0)
MCV: 101 fL — AB (ref 80–100)
PLATELETS: 240 10*3/uL (ref 150–440)
RBC: 3.66 10*6/uL — AB (ref 4.40–5.90)
RDW: 14.4 % (ref 11.5–14.5)
WBC: 8.9 10*3/uL (ref 3.8–10.6)

## 2013-12-20 LAB — ETHANOL
Ethanol %: 0.168 % — ABNORMAL HIGH (ref 0.000–0.080)
Ethanol: 168 mg/dL

## 2013-12-20 LAB — ACETAMINOPHEN LEVEL

## 2014-01-05 ENCOUNTER — Emergency Department: Payer: Self-pay | Admitting: Emergency Medicine

## 2014-01-05 LAB — CBC WITH DIFFERENTIAL/PLATELET
BASOS ABS: 0.1 10*3/uL (ref 0.0–0.1)
BASOS PCT: 1.1 %
Eosinophil #: 0.4 10*3/uL (ref 0.0–0.7)
Eosinophil %: 6.2 %
HCT: 41.5 % (ref 40.0–52.0)
HGB: 14.2 g/dL (ref 13.0–18.0)
LYMPHS ABS: 2.8 10*3/uL (ref 1.0–3.6)
Lymphocyte %: 38.5 %
MCH: 35 pg — ABNORMAL HIGH (ref 26.0–34.0)
MCHC: 34.2 g/dL (ref 32.0–36.0)
MCV: 102 fL — ABNORMAL HIGH (ref 80–100)
MONOS PCT: 9.8 %
Monocyte #: 0.7 x10 3/mm (ref 0.2–1.0)
NEUTROS ABS: 3.2 10*3/uL (ref 1.4–6.5)
Neutrophil %: 44.4 %
Platelet: 275 10*3/uL (ref 150–440)
RBC: 4.06 10*6/uL — ABNORMAL LOW (ref 4.40–5.90)
RDW: 14.7 % — ABNORMAL HIGH (ref 11.5–14.5)
WBC: 7.2 10*3/uL (ref 3.8–10.6)

## 2014-01-05 LAB — COMPREHENSIVE METABOLIC PANEL
AST: 21 U/L (ref 15–37)
Albumin: 3.9 g/dL (ref 3.4–5.0)
Alkaline Phosphatase: 166 U/L — ABNORMAL HIGH
Anion Gap: 5 — ABNORMAL LOW (ref 7–16)
BUN: 7 mg/dL (ref 7–18)
Bilirubin,Total: 0.2 mg/dL (ref 0.2–1.0)
CREATININE: 0.99 mg/dL (ref 0.60–1.30)
Calcium, Total: 9.2 mg/dL (ref 8.5–10.1)
Chloride: 101 mmol/L (ref 98–107)
Co2: 27 mmol/L (ref 21–32)
EGFR (African American): >60
EGFR (Non-African Amer.): >60
GLUCOSE: 116 mg/dL — AB (ref 65–99)
Osmolality: 265 (ref 275–301)
Potassium: 4 mmol/L (ref 3.5–5.1)
SGPT (ALT): 19 U/L (ref 12–78)
SODIUM: 133 mmol/L — AB (ref 136–145)
Total Protein: 8.7 g/dL — ABNORMAL HIGH (ref 6.4–8.2)

## 2014-01-31 ENCOUNTER — Emergency Department: Payer: Self-pay | Admitting: Emergency Medicine

## 2014-01-31 LAB — URINALYSIS, COMPLETE
BILIRUBIN, UR: NEGATIVE
BLOOD: NEGATIVE
Bacteria: NONE SEEN
GLUCOSE, UR: NEGATIVE mg/dL (ref 0–75)
Ketone: NEGATIVE
Leukocyte Esterase: NEGATIVE
Nitrite: NEGATIVE
PH: 5 (ref 4.5–8.0)
Protein: NEGATIVE
RBC,UR: NONE SEEN /HPF (ref 0–5)
Specific Gravity: 1.003 (ref 1.003–1.030)
Squamous Epithelial: NONE SEEN
WBC UR: NONE SEEN /HPF (ref 0–5)

## 2014-01-31 LAB — BASIC METABOLIC PANEL
ANION GAP: 6 — AB (ref 7–16)
BUN: 11 mg/dL (ref 7–18)
Calcium, Total: 8.7 mg/dL (ref 8.5–10.1)
Chloride: 108 mmol/L — ABNORMAL HIGH (ref 98–107)
Co2: 21 mmol/L (ref 21–32)
Creatinine: 1.16 mg/dL (ref 0.60–1.30)
EGFR (African American): >60
Glucose: 75 mg/dL (ref 65–99)
Osmolality: 268 (ref 275–301)
POTASSIUM: 4.4 mmol/L (ref 3.5–5.1)
Sodium: 135 mmol/L — ABNORMAL LOW (ref 136–145)

## 2014-01-31 LAB — TROPONIN I

## 2014-01-31 LAB — ACETAMINOPHEN LEVEL

## 2014-01-31 LAB — DRUG SCREEN, URINE
Amphetamines, Ur Screen: NEGATIVE (ref ?–1000)
Barbiturates, Ur Screen: NEGATIVE (ref ?–200)
Benzodiazepine, Ur Scrn: NEGATIVE (ref ?–200)
Cannabinoid 50 Ng, Ur ~~LOC~~: NEGATIVE (ref ?–50)
Cocaine Metabolite,Ur ~~LOC~~: NEGATIVE (ref ?–300)
MDMA (ECSTASY) UR SCREEN: NEGATIVE (ref ?–500)
Methadone, Ur Screen: NEGATIVE (ref ?–300)
Opiate, Ur Screen: NEGATIVE (ref ?–300)
PHENCYCLIDINE (PCP) UR S: NEGATIVE (ref ?–25)
Tricyclic, Ur Screen: NEGATIVE (ref ?–1000)

## 2014-01-31 LAB — CBC
HCT: 39.2 % — ABNORMAL LOW (ref 40.0–52.0)
HGB: 13.4 g/dL (ref 13.0–18.0)
MCH: 35.3 pg — AB (ref 26.0–34.0)
MCHC: 34.2 g/dL (ref 32.0–36.0)
MCV: 103 fL — ABNORMAL HIGH (ref 80–100)
Platelet: 270 10*3/uL (ref 150–440)
RBC: 3.8 10*6/uL — ABNORMAL LOW (ref 4.40–5.90)
RDW: 15.2 % — ABNORMAL HIGH (ref 11.5–14.5)
WBC: 7.2 10*3/uL (ref 3.8–10.6)

## 2014-01-31 LAB — ETHANOL
Ethanol %: 0.122 % — ABNORMAL HIGH (ref 0.000–0.080)
Ethanol: 122 mg/dL

## 2014-01-31 LAB — SALICYLATE LEVEL: Salicylates, Serum: 7.3 mg/dL — ABNORMAL HIGH

## 2014-02-01 LAB — TROPONIN I: Troponin-I: <0.02 ng/mL

## 2014-02-04 ENCOUNTER — Emergency Department: Payer: Self-pay | Admitting: Emergency Medicine

## 2014-02-04 LAB — BASIC METABOLIC PANEL
Anion Gap: 8 (ref 7–16)
BUN: 10 mg/dL (ref 7–18)
CO2: 23 mmol/L (ref 21–32)
Calcium, Total: 8.4 mg/dL — ABNORMAL LOW (ref 8.5–10.1)
Chloride: 108 mmol/L — ABNORMAL HIGH (ref 98–107)
Creatinine: 0.99 mg/dL (ref 0.60–1.30)
EGFR (Non-African Amer.): >60
Glucose: 95 mg/dL (ref 65–99)
OSMOLALITY: 276 (ref 275–301)
Potassium: 4.1 mmol/L (ref 3.5–5.1)
Sodium: 139 mmol/L (ref 136–145)

## 2014-02-04 LAB — TSH: THYROID STIMULATING HORM: 0.514 u[IU]/mL

## 2014-02-04 LAB — URINALYSIS, COMPLETE
BILIRUBIN, UR: NEGATIVE
Blood: NEGATIVE
Glucose,UR: NEGATIVE mg/dL (ref 0–75)
Ketone: NEGATIVE
Nitrite: NEGATIVE
Ph: 5 (ref 4.5–8.0)
Protein: NEGATIVE
RBC,UR: NONE SEEN /HPF (ref 0–5)
SQUAMOUS EPITHELIAL: NONE SEEN
Specific Gravity: 1.006 (ref 1.003–1.030)
WBC UR: 8 /HPF (ref 0–5)

## 2014-02-04 LAB — DRUG SCREEN, URINE

## 2014-02-04 LAB — CBC
HCT: 34.2 % — ABNORMAL LOW (ref 40.0–52.0)
HGB: 11.5 g/dL — ABNORMAL LOW (ref 13.0–18.0)
MCH: 34.9 pg — AB (ref 26.0–34.0)
MCHC: 33.7 g/dL (ref 32.0–36.0)
MCV: 104 fL — ABNORMAL HIGH (ref 80–100)
Platelet: 232 10*3/uL (ref 150–440)
RBC: 3.31 10*6/uL — ABNORMAL LOW (ref 4.40–5.90)
RDW: 15.4 % — ABNORMAL HIGH (ref 11.5–14.5)
WBC: 5.2 10*3/uL (ref 3.8–10.6)

## 2014-02-04 LAB — ETHANOL
Ethanol %: 0.119 % — ABNORMAL HIGH (ref 0.000–0.080)
Ethanol: 119 mg/dL

## 2014-02-04 LAB — ACETAMINOPHEN LEVEL: Acetaminophen: <2 ug/mL

## 2014-02-04 LAB — TROPONIN I: Troponin-I: <0.02 ng/mL

## 2014-02-04 LAB — SALICYLATE LEVEL: Salicylates, Serum: 5.5 mg/dL — ABNORMAL HIGH

## 2014-02-13 ENCOUNTER — Emergency Department: Payer: Self-pay | Admitting: Emergency Medicine

## 2014-02-13 LAB — DRUG SCREEN, URINE
Amphetamines, Ur Screen: NEGATIVE (ref ?–1000)
Barbiturates, Ur Screen: NEGATIVE (ref ?–200)
Benzodiazepine, Ur Scrn: NEGATIVE (ref ?–200)
CANNABINOID 50 NG, UR ~~LOC~~: NEGATIVE (ref ?–50)
COCAINE METABOLITE, UR ~~LOC~~: POSITIVE (ref ?–300)
MDMA (Ecstasy)Ur Screen: NEGATIVE (ref ?–500)
Methadone, Ur Screen: NEGATIVE (ref ?–300)
Opiate, Ur Screen: NEGATIVE (ref ?–300)
PHENCYCLIDINE (PCP) UR S: NEGATIVE (ref ?–25)
Tricyclic, Ur Screen: NEGATIVE (ref ?–1000)

## 2014-02-13 LAB — TROPONIN I
Troponin-I: <0.02 ng/mL
Troponin-I: <0.02 ng/mL

## 2014-02-13 LAB — ACETAMINOPHEN LEVEL: Acetaminophen: <2 ug/mL

## 2014-02-13 LAB — CBC
HCT: 37.7 % — ABNORMAL LOW (ref 40.0–52.0)
HGB: 12.8 g/dL — AB (ref 13.0–18.0)
MCH: 35.6 pg — AB (ref 26.0–34.0)
MCHC: 33.8 g/dL (ref 32.0–36.0)
MCV: 105 fL — AB (ref 80–100)
Platelet: 229 10*3/uL (ref 150–440)
RBC: 3.59 10*6/uL — AB (ref 4.40–5.90)
RDW: 15.9 % — ABNORMAL HIGH (ref 11.5–14.5)
WBC: 5.4 10*3/uL (ref 3.8–10.6)

## 2014-02-13 LAB — COMPREHENSIVE METABOLIC PANEL
ALBUMIN: 3.7 g/dL (ref 3.4–5.0)
AST: 27 U/L (ref 15–37)
Alkaline Phosphatase: 151 U/L — ABNORMAL HIGH
Anion Gap: 8 (ref 7–16)
BUN: 9 mg/dL (ref 7–18)
Bilirubin,Total: 0.2 mg/dL (ref 0.2–1.0)
CALCIUM: 8.9 mg/dL (ref 8.5–10.1)
CHLORIDE: 103 mmol/L (ref 98–107)
CO2: 28 mmol/L (ref 21–32)
Creatinine: 0.96 mg/dL (ref 0.60–1.30)
EGFR (Non-African Amer.): >60
GLUCOSE: 119 mg/dL — AB (ref 65–99)
OSMOLALITY: 277 (ref 275–301)
Potassium: 3.9 mmol/L (ref 3.5–5.1)
SGPT (ALT): 24 U/L (ref 12–78)
Sodium: 139 mmol/L (ref 136–145)
TOTAL PROTEIN: 7.8 g/dL (ref 6.4–8.2)

## 2014-02-13 LAB — SALICYLATE LEVEL: Salicylates, Serum: 5.2 mg/dL — ABNORMAL HIGH

## 2014-02-13 LAB — LIPASE, BLOOD: Lipase: 124 U/L (ref 73–393)

## 2014-02-13 LAB — ETHANOL
Ethanol %: 0.194 % — ABNORMAL HIGH (ref 0.000–0.080)
Ethanol: 194 mg/dL

## 2014-02-18 ENCOUNTER — Emergency Department: Payer: Self-pay | Admitting: Emergency Medicine

## 2014-02-18 LAB — CBC
HCT: 39.7 % — AB (ref 40.0–52.0)
HGB: 13.1 g/dL (ref 13.0–18.0)
MCH: 34.7 pg — ABNORMAL HIGH (ref 26.0–34.0)
MCHC: 33 g/dL (ref 32.0–36.0)
MCV: 105 fL — ABNORMAL HIGH (ref 80–100)
Platelet: 260 10*3/uL (ref 150–440)
RBC: 3.77 10*6/uL — ABNORMAL LOW (ref 4.40–5.90)
RDW: 16 % — ABNORMAL HIGH (ref 11.5–14.5)
WBC: 6.3 10*3/uL (ref 3.8–10.6)

## 2014-02-18 LAB — COMPREHENSIVE METABOLIC PANEL
ALBUMIN: 3.5 g/dL (ref 3.4–5.0)
AST: 22 U/L (ref 15–37)
Alkaline Phosphatase: 152 U/L — ABNORMAL HIGH
Anion Gap: 6 — ABNORMAL LOW (ref 7–16)
BUN: 15 mg/dL (ref 7–18)
Bilirubin,Total: 0.2 mg/dL (ref 0.2–1.0)
CREATININE: 1.02 mg/dL (ref 0.60–1.30)
Calcium, Total: 8.5 mg/dL (ref 8.5–10.1)
Chloride: 107 mmol/L (ref 98–107)
Co2: 25 mmol/L (ref 21–32)
EGFR (African American): >60
EGFR (Non-African Amer.): >60
Glucose: 100 mg/dL — ABNORMAL HIGH (ref 65–99)
Osmolality: 277 (ref 275–301)
Potassium: 4.2 mmol/L (ref 3.5–5.1)
SGPT (ALT): 21 U/L (ref 12–78)
Sodium: 138 mmol/L (ref 136–145)
TOTAL PROTEIN: 7.7 g/dL (ref 6.4–8.2)

## 2014-02-18 LAB — SALICYLATE LEVEL: SALICYLATES, SERUM: 3.3 mg/dL — AB

## 2014-02-18 LAB — CK TOTAL AND CKMB (NOT AT ARMC)
CK, TOTAL: 79 U/L
CK-MB: 0.8 ng/mL (ref 0.5–3.6)

## 2014-02-18 LAB — TROPONIN I
Troponin-I: <0.02 ng/mL
Troponin-I: <0.02 ng/mL

## 2014-02-18 LAB — ACETAMINOPHEN LEVEL: Acetaminophen: <2 ug/mL

## 2014-02-18 LAB — ETHANOL
ETHANOL LVL: 147 mg/dL
Ethanol %: 0.147 % — ABNORMAL HIGH (ref 0.000–0.080)

## 2014-02-19 LAB — DRUG SCREEN, URINE
Amphetamines, Ur Screen: NEGATIVE (ref ?–1000)
BENZODIAZEPINE, UR SCRN: NEGATIVE (ref ?–200)
Barbiturates, Ur Screen: NEGATIVE (ref ?–200)
COCAINE METABOLITE, UR ~~LOC~~: NEGATIVE (ref ?–300)
Cannabinoid 50 Ng, Ur ~~LOC~~: NEGATIVE (ref ?–50)
MDMA (ECSTASY) UR SCREEN: NEGATIVE (ref ?–500)
Methadone, Ur Screen: NEGATIVE (ref ?–300)
OPIATE, UR SCREEN: NEGATIVE (ref ?–300)
PHENCYCLIDINE (PCP) UR S: NEGATIVE (ref ?–25)
Tricyclic, Ur Screen: NEGATIVE (ref ?–1000)

## 2014-04-25 ENCOUNTER — Ambulatory Visit: Payer: Self-pay | Admitting: Family Medicine

## 2014-05-13 ENCOUNTER — Emergency Department: Payer: Self-pay | Admitting: Emergency Medicine

## 2014-05-13 LAB — BASIC METABOLIC PANEL
ANION GAP: 6 — AB (ref 7–16)
BUN: 13 mg/dL (ref 7–18)
CO2: 27 mmol/L (ref 21–32)
Calcium, Total: 8.3 mg/dL — ABNORMAL LOW (ref 8.5–10.1)
Chloride: 108 mmol/L — ABNORMAL HIGH (ref 98–107)
Creatinine: 1.25 mg/dL (ref 0.60–1.30)
Glucose: 91 mg/dL (ref 65–99)
OSMOLALITY: 281 (ref 275–301)
Potassium: 4.2 mmol/L (ref 3.5–5.1)
Sodium: 141 mmol/L (ref 136–145)

## 2014-05-13 LAB — CBC WITH DIFFERENTIAL/PLATELET
BASOS PCT: 0.9 %
Basophil #: 0.1 10*3/uL (ref 0.0–0.1)
EOS PCT: 4.2 %
Eosinophil #: 0.3 10*3/uL (ref 0.0–0.7)
HCT: 37.5 % — ABNORMAL LOW (ref 40.0–52.0)
HGB: 12.4 g/dL — ABNORMAL LOW (ref 13.0–18.0)
Lymphocyte #: 2.6 10*3/uL (ref 1.0–3.6)
Lymphocyte %: 38.9 %
MCH: 34.9 pg — ABNORMAL HIGH (ref 26.0–34.0)
MCHC: 33.2 g/dL (ref 32.0–36.0)
MCV: 105 fL — ABNORMAL HIGH (ref 80–100)
Monocyte #: 0.5 x10 3/mm (ref 0.2–1.0)
Monocyte %: 7.3 %
Neutrophil #: 3.3 10*3/uL (ref 1.4–6.5)
Neutrophil %: 48.7 %
Platelet: 197 10*3/uL (ref 150–440)
RBC: 3.56 10*6/uL — ABNORMAL LOW (ref 4.40–5.90)
RDW: 13.9 % (ref 11.5–14.5)
WBC: 6.7 10*3/uL (ref 3.8–10.6)

## 2014-05-13 LAB — CARBAMAZEPINE LEVEL, TOTAL: CARBAMAZEPINE: 0.5 ug/mL — AB (ref 4.0–12.0)

## 2014-05-13 LAB — TROPONIN I

## 2014-06-11 ENCOUNTER — Inpatient Hospital Stay: Payer: Self-pay | Admitting: Internal Medicine

## 2014-06-11 LAB — CBC WITH DIFFERENTIAL/PLATELET
BASOS PCT: 1.1 %
Basophil #: 0.1 10*3/uL (ref 0.0–0.1)
EOS ABS: 0.3 10*3/uL (ref 0.0–0.7)
Eosinophil %: 4.6 %
HCT: 33 % — ABNORMAL LOW (ref 40.0–52.0)
HGB: 10.8 g/dL — AB (ref 13.0–18.0)
LYMPHS ABS: 2.3 10*3/uL (ref 1.0–3.6)
Lymphocyte %: 37.2 %
MCH: 34.3 pg — ABNORMAL HIGH (ref 26.0–34.0)
MCHC: 32.9 g/dL (ref 32.0–36.0)
MCV: 104 fL — ABNORMAL HIGH (ref 80–100)
Monocyte #: 0.7 x10 3/mm (ref 0.2–1.0)
Monocyte %: 10.5 %
NEUTROS ABS: 2.9 10*3/uL (ref 1.4–6.5)
NEUTROS PCT: 46.6 %
PLATELETS: 314 10*3/uL (ref 150–440)
RBC: 3.16 10*6/uL — AB (ref 4.40–5.90)
RDW: 14.7 % — ABNORMAL HIGH (ref 11.5–14.5)
WBC: 6.2 10*3/uL (ref 3.8–10.6)

## 2014-06-11 LAB — COMPREHENSIVE METABOLIC PANEL
ANION GAP: 6 — AB (ref 7–16)
AST: 21 U/L (ref 15–37)
Albumin: 2.9 g/dL — ABNORMAL LOW (ref 3.4–5.0)
Alkaline Phosphatase: 143 U/L — ABNORMAL HIGH
BUN: 13 mg/dL (ref 7–18)
Bilirubin,Total: 0.1 mg/dL — ABNORMAL LOW (ref 0.2–1.0)
CALCIUM: 7.7 mg/dL — AB (ref 8.5–10.1)
CO2: 28 mmol/L (ref 21–32)
Chloride: 106 mmol/L (ref 98–107)
Creatinine: 1.31 mg/dL — ABNORMAL HIGH (ref 0.60–1.30)
EGFR (Non-African Amer.): >60
Glucose: 85 mg/dL (ref 65–99)
Osmolality: 279 (ref 275–301)
Potassium: 3.9 mmol/L (ref 3.5–5.1)
SGPT (ALT): 23 U/L
Sodium: 140 mmol/L (ref 136–145)
Total Protein: 6.5 g/dL (ref 6.4–8.2)

## 2014-06-12 LAB — CBC WITH DIFFERENTIAL/PLATELET
BASOS ABS: 0.1 10*3/uL (ref 0.0–0.1)
Basophil %: 1 %
EOS ABS: 0.3 10*3/uL (ref 0.0–0.7)
EOS PCT: 4 %
HCT: 30.7 % — AB (ref 40.0–52.0)
HGB: 10.1 g/dL — ABNORMAL LOW (ref 13.0–18.0)
LYMPHS ABS: 1.7 10*3/uL (ref 1.0–3.6)
Lymphocyte %: 24.7 %
MCH: 34.6 pg — ABNORMAL HIGH (ref 26.0–34.0)
MCHC: 33 g/dL (ref 32.0–36.0)
MCV: 105 fL — AB (ref 80–100)
Monocyte #: 0.5 x10 3/mm (ref 0.2–1.0)
Monocyte %: 7.8 %
Neutrophil #: 4.2 10*3/uL (ref 1.4–6.5)
Neutrophil %: 62.5 %
PLATELETS: 232 10*3/uL (ref 150–440)
RBC: 2.93 10*6/uL — ABNORMAL LOW (ref 4.40–5.90)
RDW: 14.2 % (ref 11.5–14.5)
WBC: 6.7 10*3/uL (ref 3.8–10.6)

## 2014-06-12 LAB — BASIC METABOLIC PANEL
Anion Gap: 5 — ABNORMAL LOW (ref 7–16)
BUN: 11 mg/dL (ref 7–18)
Calcium, Total: 8.2 mg/dL — ABNORMAL LOW (ref 8.5–10.1)
Chloride: 107 mmol/L (ref 98–107)
Co2: 26 mmol/L (ref 21–32)
Creatinine: 1.29 mg/dL (ref 0.60–1.30)
Glucose: 131 mg/dL — ABNORMAL HIGH (ref 65–99)
Osmolality: 277 (ref 275–301)
Potassium: 4.1 mmol/L (ref 3.5–5.1)
Sodium: 138 mmol/L (ref 136–145)

## 2014-06-15 LAB — WOUND CULTURE

## 2014-06-16 LAB — CULTURE, BLOOD (SINGLE)

## 2014-06-19 ENCOUNTER — Emergency Department: Payer: Self-pay | Admitting: Internal Medicine

## 2014-06-30 ENCOUNTER — Emergency Department: Payer: Self-pay | Admitting: Emergency Medicine

## 2014-09-05 ENCOUNTER — Emergency Department: Payer: Self-pay | Admitting: Emergency Medicine

## 2014-09-05 LAB — COMPREHENSIVE METABOLIC PANEL
ALK PHOS: 307 U/L — AB
ANION GAP: 7 (ref 7–16)
Albumin: 3.4 g/dL (ref 3.4–5.0)
BUN: 5 mg/dL — AB (ref 7–18)
Bilirubin,Total: 0.5 mg/dL (ref 0.2–1.0)
CALCIUM: 9.4 mg/dL (ref 8.5–10.1)
Chloride: 101 mmol/L (ref 98–107)
Co2: 32 mmol/L (ref 21–32)
Creatinine: 1.15 mg/dL (ref 0.60–1.30)
EGFR (African American): >60
EGFR (Non-African Amer.): >60
Glucose: 108 mg/dL — ABNORMAL HIGH (ref 65–99)
Osmolality: 277 (ref 275–301)
Potassium: 3.6 mmol/L (ref 3.5–5.1)
SGOT(AST): 54 U/L — ABNORMAL HIGH (ref 15–37)
SGPT (ALT): 44 U/L
Sodium: 140 mmol/L (ref 136–145)
Total Protein: 8.6 g/dL — ABNORMAL HIGH (ref 6.4–8.2)

## 2014-09-05 LAB — CBC
HCT: 40.7 % (ref 40.0–52.0)
HGB: 13.7 g/dL (ref 13.0–18.0)
MCH: 33.6 pg (ref 26.0–34.0)
MCHC: 33.7 g/dL (ref 32.0–36.0)
MCV: 99 fL (ref 80–100)
Platelet: 231 10*3/uL (ref 150–440)
RBC: 4.09 10*6/uL — ABNORMAL LOW (ref 4.40–5.90)
RDW: 14.3 % (ref 11.5–14.5)
WBC: 5.6 10*3/uL (ref 3.8–10.6)

## 2014-09-05 LAB — TROPONIN I: Troponin-I: <0.02 ng/mL

## 2014-09-05 LAB — SALICYLATE LEVEL: Salicylates, Serum: 2 mg/dL

## 2014-09-05 LAB — URINALYSIS, COMPLETE
Bacteria: NONE SEEN
Bilirubin,UR: NEGATIVE
Blood: NEGATIVE
GLUCOSE, UR: NEGATIVE mg/dL (ref 0–75)
Leukocyte Esterase: NEGATIVE
Nitrite: NEGATIVE
Ph: 9 (ref 4.5–8.0)
RBC,UR: 1 /HPF (ref 0–5)
SQUAMOUS EPITHELIAL: NONE SEEN
Specific Gravity: 1.012 (ref 1.003–1.030)

## 2014-09-05 LAB — PROTIME-INR
INR: 0.9
Prothrombin Time: 12.3 secs (ref 11.5–14.7)

## 2014-09-05 LAB — DRUG SCREEN, URINE
Amphetamines, Ur Screen: NEGATIVE (ref ?–1000)
BENZODIAZEPINE, UR SCRN: NEGATIVE (ref ?–200)
Barbiturates, Ur Screen: NEGATIVE (ref ?–200)
COCAINE METABOLITE, UR ~~LOC~~: NEGATIVE (ref ?–300)
Cannabinoid 50 Ng, Ur ~~LOC~~: NEGATIVE (ref ?–50)
MDMA (ECSTASY) UR SCREEN: NEGATIVE (ref ?–500)
Methadone, Ur Screen: NEGATIVE (ref ?–300)
Opiate, Ur Screen: NEGATIVE (ref ?–300)
PHENCYCLIDINE (PCP) UR S: NEGATIVE (ref ?–25)
Tricyclic, Ur Screen: NEGATIVE (ref ?–1000)

## 2014-09-05 LAB — LIPASE, BLOOD: Lipase: 76 U/L (ref 73–393)

## 2014-09-05 LAB — CK TOTAL AND CKMB (NOT AT ARMC)
CK, Total: 37 U/L — ABNORMAL LOW (ref 39–308)
CK-MB: 0.5 ng/mL (ref 0.5–3.6)

## 2014-09-05 LAB — ETHANOL: Ethanol: <3 mg/dL

## 2014-09-05 LAB — ACETAMINOPHEN LEVEL

## 2014-10-30 ENCOUNTER — Emergency Department: Payer: Self-pay | Admitting: Emergency Medicine

## 2015-01-01 NOTE — Consult Note (Signed)
Chief Complaint:   Subjective/Chief Complaint Pt not in room again. Just saw Dr. Clayborn Bigness in the room and then stepped out without letting nursing staff know. Apparently, less abd pain. WBC coming down. HIDA scan neg for bile leak. [facility] op report still not available.   VITAL SIGNS/ANCILLARY NOTES: **Vital Signs.:   06-Sep-13 11:20   Vital Signs Type Routine   Temperature Temperature (F) 97.2   Celsius 36.2   Temperature Source oral   Pulse Pulse 84   Respirations Respirations 20   Systolic BP Systolic BP 573   Diastolic BP (mmHg) Diastolic BP (mmHg) 92   Mean BP 107   Pulse Ox % Pulse Ox % 98   Pulse Ox Activity Level  At rest   Oxygen Delivery Room Air/ 21 %   Brief Assessment:   Cardiac Regular    Respiratory clear BS    Gastrointestinal Normal   Lab Results: Hepatic:  06-Sep-13 04:39    Bilirubin, Total 0.2   Alkaline Phosphatase  160   SGPT (ALT) 54   SGOT (AST)  40   Total Protein, Serum 7.0   Albumin, Serum  2.8  Routine Chem:  06-Sep-13 04:39    Glucose, Serum 92   BUN 15   Creatinine (comp)  1.31   Sodium, Serum 143   Potassium, Serum 3.9   Chloride, Serum  113   CO2, Serum 26   Calcium (Total), Serum  8.4   Osmolality (calc) 285   eGFR (African American) >60   eGFR (Non-African American) >60 (eGFR values <31m/min/1.73 m2 may be an indication of chronic kidney disease (CKD). Calculated eGFR is useful in patients with stable renal function. The eGFR calculation will not be reliable in acutely ill patients when serum creatinine is changing rapidly. It is not useful in  patients on dialysis. The eGFR calculation may not be applicable to patients at the low and high extremes of body sizes, pregnant women, and vegetarians.)   Anion Gap  4   Magnesium, Serum 2.0 (1.8-2.4 THERAPEUTIC RANGE: 4-7 mg/dL TOXIC: > 10 mg/dL  -----------------------)   Hemoglobin A1c ([facility])  6.4 (The American Diabetes Association recommends that a primary goal of therapy should  be <7% and that physicians should reevaluate the treatment regimen in patients with HbA1c values consistently >8%.)   Cholesterol, Serum 108   Triglycerides, Serum 157   HDL (INHOUSE)  20   VLDL Cholesterol Calculated 31   LDL Cholesterol Calculated 57 (Result(s) reported on 20 May 2012 at 05:36AM.)  Routine Coag:  06-Sep-13 04:39    Prothrombin 14.1   INR 1.1 (INR reference interval applies to patients on anticoagulant therapy. A single INR therapeutic range for coumarins is not optimal for all indications; however, the suggested range for most indications is 2.0 - 3.0. Exceptions to the INR Reference Range may include: Prosthetic heart valves, acute myocardial infarction, prevention of myocardial infarction, and combinations of aspirin and anticoagulant. The need for a higher or lower target INR must be assessed individually. Reference: The Pharmacology and Management of the Vitamin K  antagonists: the seventh ACCP Conference on Antithrombotic and Thrombolytic Therapy. CUKGUR.4270Sept:126 (3suppl): [id number] A HCT value >55% may artifactually increase the PT.  In one study,  the increase was an average of 25%. Reference:  "Effect on Routine and Special Coagulation Testing Values of Citrate Anticoagulant Adjustment in Patients with High HCT Values." American Journal of Clinical Pathology 2006;126:400-405.)  Routine Hem:  06-Sep-13 04:39    WBC (CBC)  20.9  RBC (CBC)  3.37   Hemoglobin (CBC)  10.9   Hematocrit (CBC)  32.9   Platelet Count (CBC) 245   MCV 98   MCH 32.4   MCHC 33.2   RDW 14.4   Neutrophil % 76.8   Lymphocyte % 13.7   Monocyte % 4.8   Eosinophil % 3.8   Basophil % 0.9   Neutrophil #  16.0   Lymphocyte # 2.9   Monocyte # 1.0   Eosinophil #  0.8   Basophil #  0.2 (Result(s) reported on 20 May 2012 at 05:16AM.)   Radiology Results: Nuclear Med:    06-Sep-13 09:09, Hepatobiliary Image - Nuc Med   Hepatobiliary Image - Nuc Med    REASON FOR EXAM:    s/p  lap chole.  evaluate for bile leak  COMMENTS:       PROCEDURE: NM  - NM HEPATOBILIARY IMAGE  - May 20 2012  9:09AM     RESULT: The patient received an injection of 8.38 mCi of technetium 20m  Choletec. There is prompt extraction of tracer from the blood pool by the   liver. Hepatic duct activity is seen at 10 minutes with common bile duct   and bowel visualized at 20 minutes. There appears to be progressive   clearing of activity from the liver throughout the study with increasing   accumulation in what appears to be loops of small bowel.    IMPRESSION:  Unremarkable post cholecystectomy hepatobiliary study.    Thank you for the opportunity to contribute to the care of your patient.   Dictation Site: 2          Verified By: GSundra Aland M.D., MD   Assessment/Plan:  Assessment/Plan:   Assessment Prob sepsis after GB surgery. HIDA scan. CT without contrast neg.    Plan Will check back later if patient present in room. Continue Abx. No plan for any endoscopic procedure at this time. LFT elevation could be from alcohol. With drug abuse hx, may have chronic Hep B or C. Check hepatitis panel. Dr. EVira Agarwill check patient this weekend. THanks.   Electronic Signatures: OVerdie Shire(MD)  (Signed 06-Sep-13 13:09)  Authored: Chief Complaint, VITAL SIGNS/ANCILLARY NOTES, Brief Assessment, Lab Results, Radiology Results, Assessment/Plan   Last Updated: 06-Sep-13 13:09 by OVerdie Shire(MD)

## 2015-01-01 NOTE — Consult Note (Signed)
Brief Consult Note: Diagnosis: Bipolar disorder NOS.   Patient was seen by consultant.   Consult note dictated.   Recommend further assessment or treatment.   Orders entered.   Comments: [name] has a h/o bipolar. He pulled his toenails and reports symptoms suggestive of hypomania with racing thoughts, insomnia, hyperactivity and irritability. He has [employer] benefits.   PLAN: 1. The patient was placed on IVC.  [facility]. [facility]   3. We will continue Seroquel, Tegretol, and Trazodone.   4. I will follow up.  Electronic Signatures: Kristine LineaPucilowska, Jolanta (MD)  (Signed 24-Dec-13 12:18)  Authored: Brief Consult Note   Last Updated: 24-Dec-13 12:18 by Kristine LineaPucilowska, Jolanta (MD)

## 2015-01-01 NOTE — Consult Note (Signed)
Brief Consult Note: Diagnosis: Admitted for abdominal pain to right upper quadrant.  Leukocytosis.  s/p lap chole @ 10 days ago.  Known history of CAD, COPD and DM.   Consult note dictated.   Orders entered.   Discussed with Attending MD.   Comments: Patient's presentation discussed with Dr. Lutricia FeilPaul Oh.  Recommendation is to proceed with HIDA scan tomorrow to assess for evidence of bile leak given presentation of right upper quadrant abdominal pain with elevation of WBC.  Order placed.  Medical release form to be signed [facility].  Will continue to monitor his status as well as laboratory studies.  Pain is better with him receiving pain management during hospitalization.  Electronic Signatures: Rodman KeyHarrison, Dawn S (NP)  (Signed 05-Sep-13 18:09)  Authored: Brief Consult Note   Last Updated: 05-Sep-13 18:09 by Rodman KeyHarrison, Dawn S (NP)

## 2015-01-01 NOTE — Op Note (Signed)
PATIENT NAME:  [name], [name] MR#:  [id number] DATE OF BIRTH:  1971-05-18  DATE OF PROCEDURE:  05/20/2012  PREOPERATIVE DIAGNOSIS:  Pneumonia.   POSTOPERATIVE DIAGNOSIS: Pneumonia.   PROCEDURES:  1. Ultrasound guidance for vascular access to the left basilic vein.  2. Fluoroscopic guidance for placement of catheter.  3. Insertion of peripherally inserted central venous catheter, double lumen Morpheus PICC line, left arm.  SURGEON: Renford DillsGregory G. Runette Scifres, MD   ANESTHESIA: Local.   ESTIMATED BLOOD LOSS: Minimal.   INDICATION FOR PROCEDURE: Requiring IV antibiotics greater than five days.   DESCRIPTION OF PROCEDURE: The patient's left arm was sterilely prepped and draped, and a sterile surgical field was created. The left basilic vein was accessed under direct ultrasound guidance without difficulty with a micropuncture needle and permanent image was recorded. 0.018 wire was then placed into the superior vena cava. Peel-away sheath was placed over the wire. A single lumen peripherally inserted central venous catheter was then placed over the wire and the wire and peel-away sheath were removed. The catheter tip was placed into the superior vena cava and was secured at the skin at 40 cm with a sterile dressing. The catheter withdrew blood well and flushed easily with heparinized saline. The patient tolerated procedure well.  ____________________________ Renford DillsGregory G. Jerren Flinchbaugh, MD ggs:drc D: 05/20/2012 16:34:57 ET T: 05/21/2012 10:26:33 ET JOB#: [id number]  cc: Renford DillsGregory G. Jamere Stidham, MD, <Dictator> Renford DillsGREGORY G Loman Logan MD ELECTRONICALLY SIGNED 05/24/2012 10:08

## 2015-01-01 NOTE — H&P (Signed)
PATIENT NAME:  [name], [name] MR#:  [id number] DATE OF BIRTH:  03-Jul-1971  DATE OF ADMISSION:  05/19/2012  PRIMARY CARE PHYSICIAN: Non-local  ER PHYSICIAN: Dr. Manson PasseyBrown  ADMITTING PHYSICIAN: Dr. Tilda FrancoAkuneme   PRESENTING COMPLAINT: Abdominal pain, dizziness, and falls.   HISTORY OF PRESENT ILLNESS: The patient is a 44 year old male who [facility] about 10 days ago. He had been doing very well at home but over the last 48 hours has had recurrent chills and generalized weakness, nausea but no vomiting, no diarrhea, no dysuria but admits to frequency of urine.  Today he had three episodes of falls when he changed position. This was preceded by dizziness and then he passed out. He does not recall any generalized tonic-clonic motion. No history of ENT bleed. For this he was brought to the Emergency Room today and referred to the hospitalist for further evaluation. Work-up in with Emergency Room showed elevated white count and elevated LFTs. Noncontrast CT was done which showed no acute abnormality. He was started on antibiotics and referred to the hospitalist. The patient admits to having an episode of chest pain today during the last episode of fall. The pain was dull in nature, left-sided, with associated diaphoresis. He experienced this pain for more than 50 minutes, relieved by nitroglycerin. For this he presented to the emergency room.  REVIEW OF SYSTEMS:  CONSTITUTIONAL: Positive for fatigue and weakness. No weight loss or weight gain. Also positive for fever with chills. EYES: No blurred vision, redness, or discharge. ENT: No tinnitus, epistaxis, or difficulty swallowing. RESPIRATORY: Admits to some cough which is nonproductive. Denies any wheezing. CARDIOVASCULAR: Positive for chest pain, which resolved with nitroglycerin. No orthopnea, pedal edema, arrhythmia, or palpitations. Also positive for syncope. GI: Positive for nausea but no vomiting. No diarrhea.  Also positive for mild abdominal pain in the right upper quadrant. No change in bowel habits.  GU: Positive for urinary frequency. ENDOCRINE: No polyuria, polydipsia, heat or cold intolerance, or excessive thirst. HEMATOLOGIC: No anemia, easy bruising, or swollen glands.  SKIN: No rashes or change in hair or skin texture. MUSCULOSKELETAL: No joint pain, redness, swelling, or limited activity. NEURO: No numbness, tremors, or seizures. PSYCH: No anxiety or depression.   PAST MEDICAL HISTORY:  1. History of posttraumatic stress disorder. 2. Prior history of substance abuse with cocaine, quit about six months ago. 3. Hyperlipidemia.  4. Hypertension.  5. Coronary artery disease, status post cardiac arrest, medically managed.  6. Osteoarthritis.  7. Bipolar disorder.  8. Gastroesophageal reflux disease. 9. Type 2 diabetes. 10. Chronic obstructive pulmonary disease. 11. Bleeding peptic ulcer disease with prior history of GI bleed.  12. History of MRSA. 13. History of thrombolytic thrombocytopenia, status post plasma exchange.   PAST SURGICAL HISTORY:  1. Recent cholecystectomy laparoscopically. 2. Left hand surgery.   SOCIAL HISTORY: Lives at home with family. Active cigarette smoker, 1 pack per day. Reformed substance abuser, quit cocaine about six months ago. Denies any alcohol or recreational drug use. On disability.   FAMILY HISTORY: Reviewed and noncontributory.   ALLERGIES: Demerol which causes anaphylactic response and erythromycin, which causes generalized body swelling.   MEDICATIONS:  1. Aspirin 81 mg daily.  2. Hydromorphone 4 mg q. 4 h.  3. Isosorbide mononitrate 30 mg once daily.  4. Nitroglycerin 0.4 mg q. 5 minutes p.r.n. for chest pain. 5. Carbamazepine 100 mg, 2 tablets once daily.  6. Gabapentin 600 mg, 1 tablet 3 times daily.  7. Metformin 500  mg twice daily.  8. Pravastatin 10 mg at bedtime. 9. Quetiapine 200 mg at bedtime.  10. Coreg 12.5 mg, half tablet twice  daily. 11. Albuterol inhaler, 1 puff q. 6 hours p.r.n. for shortness of breath. 12. Docusate 100 mg, 2 tablets twice daily.  13. Omeprazole 20 mg once daily.  14. Cholecalciferol 1000 international units, 1 tablet once daily.   PHYSICAL EXAMINATION:  VITAL SIGNS: Temperature not documented, pulse 82, respiratory rate 18, blood pressure 92/54, sats 99% on room air. No orthostatic vitals done.   GENERAL: Young male lying on the gurney, awake, alert, oriented to time, place, and person, in no obvious distress.   HEENT: Atraumatic, normocephalic. Pupils equal, reactive to light and accommodation. Extraocular movement intact. Mucous membranes pink and dry.   NECK: Supple. No JV distention.   CHEST: Generalized rhonchi with some rales right lower lobe.   HEART: Regular rate and rhythm. No murmur.   ABDOMEN: Full, moves with respiration, nontender. Laparoscopic scars seen. No discharge from the site.   EXTREMITIES: No edema. No clubbing. No deformity.   NEURO: No focal deficits. Affect appropriate to situation.   LABORATORY, DIAGNOSTIC, AND RADIOLOGICAL DATA: EKG showed normal sinus rhythm of 99. Chest x-ray showed no obvious infiltrates in the lung.  CT scan, noncontrast study of the abdomen and pelvis, showed no acute abnormality.   CBC showed white count of 27 from one week ago. Hemoglobin is 10.5, platelets 245, neutrophil count 96. Chemistry unremarkable except for elevated creatinine of 2.5 from baseline of  1 from two weeks ago. BUN 19, glucose 207, calcium 8.6, potassium 3.6. LFTs are elevated. Alkaline phosphatase 185 from 141 from two weeks ago. AST 200 from 23. ALT of 98 from a level of 17 two weeks ago. CK 60, troponin negative. Urine drug screen negative.  Urinalysis negative. Records show echocardiogram done four months ago with ejection fraction of 55%.   IMPRESSION:  1. Syncopal episodes, likely orthostatic in nature. Rule out arrhythmia versus seizures. 2. Recurrent febrile  episodes, likely suggestive of ascending cholangitis or bile leakage in view of recent surgery and elevated LFTs.  Also consider early clinical pneumonia with bronchitis.  3. History of chest pain with prior history of coronary artery disease noted.  4. Acute kidney injury, likely from volume depletion compounded by hypotension.  5. Tobacco use. 6. Chronic obstructive pulmonary disease with mild exacerbation.  7. Type 2 diabetes, poorly controlled.  8. Hypertension, currently hypotensive.  9. History of posttraumatic stress disorder. 10. Abnormal liver function tests, likely ascending cholangitis vs bile leakage.   PLAN:   1. Admit to telemetry floor on inpatient status with lactic acid levels. Check lipase, blood cultures times two, serial cardiac enzymes.  2. IV antibiotics with vancomycin, Levaquin. Patient already received vancomycin and Zosyn in the ER. 3. IV fluid for rehydration.  4. Sliding scale insulin for blood sugar control. We will hold off on the metformin for now.  5. Respiratory support with oxygen, nebulizer, steroid therapy. Also continue with Levaquin and vancomycin. 6. Abnormal liver function tests noted. We will trend these.  7. GI prophylaxis with Protonix.  8. Deep vein thrombosis prophylaxis with subcutaneous heparin.  9. CODE STATUS: FULL CODE.  TOTAL PATIENT CARE TIME: 50 minutes.   ____________________________ Floy Sabina Tilda Franco, MD mia:bjt D: 05/19/2012 03:55:02 ET T: 05/19/2012 08:23:55 ET JOB#: [id number]  cc: Temara Lanum I. Tilda Franco, MD, <Dictator> Margaret Pyle MD ELECTRONICALLY SIGNED 05/20/2012 0:36

## 2015-01-01 NOTE — Consult Note (Signed)
CC: sepsis with neg cultures.  Patient feeling much better, walking in halls without problems.  Abd minimal tender on right.  VSS afebrile.  Plans per ID.  Continue iv anitbiotics and switch to oral at discharge.  Electronic Signatures: Scot JunElliott, Vihan Santagata T (MD)  (Signed on 07-Sep-13 12:20)  Authored  Last Updated: 07-Sep-13 12:20 by Scot JunElliott, Denson Niccoli T (MD)

## 2015-01-01 NOTE — Consult Note (Signed)
PATIENT NAME:  [name], [name] MR#:  [id number] DATE OF BIRTH:  09/04/1971  DATE OF CONSULTATION:  05/19/2012  REFERRING PHYSICIAN:  Dr Imogene Burnhen.   CONSULTING PHYSICIAN:  Rosalyn GessMichael E. Andray Assefa, MD  REASON FOR CONSULTATION: Possible sepsis.    HISTORY OF PRESENT ILLNESS: The patient is a 44 year old white man with a past history significant for TTP, diabetes, chronic obstructive pulmonary disease, who recently had a cholecystectomy following obstructive cholecystitis approximately 10 days ago who presented with a 2-day history of dizziness, fevers, syncope, and right upper quadrant pain. The patient was admitted today. He had had several episodes of syncope today, and EMS was called. He had been having low blood pressures when checked at home. At one point in time, he states his blood pressure was 59 systolic. He was brought to the emergency room, found to have leukocytosis and transaminitis. He has been started on vancomycin, Zosyn, and levofloxacin. The patient states that he has had some headache especially with the dizziness. He has also had some chills and sweats. He has noted some cough and some sputum production. He has not had any change in his bowels. He has had some nausea but no vomiting. His abdominal pain is mainly in the right upper quadrant. It is not associated with eating. He has not had any rashes.   ALLERGIES: Demerol, erythromycin, and morphine.   PAST MEDICAL HISTORY:  1. Diabetes.  2. TTP.  3. Prior cocaine abuse.  4. Hypercholesterolemia.  5. Hypertension.  6. Coronary artery disease, status post cardiac arrest. He states his most recent catheterization about a year ago found only 30% artery stenosis.  7. Osteoarthritis.  8. Bipolar disorder.  9. Gastroesophageal reflux disease.  10. Chronic obstructive pulmonary disease.  11. Peptic ulcer disease.  12. Recent cholecystectomy following obstructive cholecystitis.  13. MRSA infection of the left hand, status post debridement.    SOCIAL HISTORY: The patient lives with his wife. She has not been ill recently. He smokes 1 pack per cigarettes per day. He is a prior crack cocaine abuser, however quit 6 months ago. He does not drink alcohol.   FAMILY HISTORY: The patient is adopted.   REVIEW OF SYSTEMS. GENERAL: Positive fevers, chills, sweats, malaise. HEENT: Positive headaches and dizziness. No sinus congestion. No sore throat. NECK: No stiffness. No swollen glands. RESPIRATORY: Positive cough with occasional sputum production, some shortness of breath. CARDIAC: No chest pains or palpitations. No peripheral edema. GI: Positive nausea, no vomiting, possible abdominal pain in the right upper quadrant. No change in his bowels. GENITOURINARY: No change in his urine. MUSCULOSKELETAL: He has generalized malaise and achiness but no frank arthritis. SKIN: No rashes. NEUROLOGIC: The patient has had multiple episodes of falling and had an episode of syncope today. No focal weakness, however. PSYCHIATRIC: No complaints. All other systems are negative.   PHYSICAL EXAMINATION:  VITAL SIGNS: T-max of 98.4, pulse 88, blood pressure 96/61, 97% on room air.   GENERAL: A 44 year old white man in no acute distress.   HEENT: Normocephalic, atraumatic. Pupils equal, reactive to light. Extraocular motion intact. Sclerae, conjunctivae, and lids are without evidence for emboli or petechiae. Oropharynx shows no erythema or exudate. Teeth and gums are in fair condition.   NECK: Supple. Full range of motion. Midline trachea. No lymphadenopathy. No thyromegaly.   LUNGS: Clear to auscultation bilaterally with good air movement. No focal consolidation.   HEART: Regular rate and rhythm without murmur, rub, or gallop.   ABDOMEN: Soft. He had tenderness mainly in the  right upper quadrant. There was some rebound tenderness as well. There was no significant guarding. There were no hernias noted.   EXTREMITIES: No evidence for tenosynovitis.   SKIN: No  rashes. No stigmata of endocarditis, specifically no Janeway lesions or Osler nodes.   NEUROLOGIC: The patient is awake and interactive, moving all 4 extremities.   PSYCHIATRIC: Mood and affect appeared normal.   LABORATORY DATA: BUN of 19, creatinine 2.56, bicarbonate 26, anion gap of 10, AST 12, 200, ALT 98, alkaline phosphatase 185, total bilirubin 0.6. Tox screen is negative. White count is 27.0, hemoglobin 10.5, platelet count of 245,000. ANC of 26.1. Urinalysis was negative. Chest x-ray showed no acute cardiac disease.  CT scan without contrast showed no evidence for obstructive or inflammatory abnormalities. The liver, spleen, pancreas, and kidneys were unremarkable although limited by noncontrast study. His creatinine was 1.02 on May 03, 2012.   IMPRESSION: A 44 year old white man with a history of TTP, diabetes, recent obstructive cholecystitis status post cholecystectomy who was admitted with a sepsis-like syndrome.   RECOMMENDATIONS:  1. He has fever, leukocytosis, hypotension, and syncope. This is suggestive of sepsis. Blood cultures are pending. Flaring up of his TTP would be possible, although he is mentating well and fever is less commonly seen in this. His platelets are normal.  2. His chest x-ray is negative. While he has abdominal pain with some rebound, his LFTs do not show an obstructive pattern. He has no jaundice to suggest an ascending cholangitis. Would repeat his LFTs tomorrow. GI is to see him today.  3. We will continue Zosyn and vancomycin. There is no need for levofloxacin.  4. We will await the blood culture results.  5. We will follow his CBC and fever curve.  6. If he remains hypotensive would give IV fluid boluses and consider transfer to the CCU for pressors. If his renal function improves, then we could consider getting a CT scan with contrast if his abdominal pain persists.   This is a highly complex infectious disease case. Thank you very much for involving  me in Mr. Jablonski's care.    ____________________________ Rosalyn Gess. Yocheved Depner, MD meb:vtd D: 05/19/2012 14:31:15 ET   T: 05/20/2012 07:54:05 ET   JOB#: [id number] cc: Rosalyn Gess. Adaiah Jaskot, MD, <Dictator> Seleny Allbright E Leilan Bochenek MD ELECTRONICALLY SIGNED 05/20/2012 12:05

## 2015-01-01 NOTE — Consult Note (Signed)
Impression: 44yo WM w/ h/o TTP, recent obstructive cholelithiasis, s/p cholecystecomy admitted with sepsis like syndrome.  He has fever, leukocytosis, hypotension and syncope.  This is suggestive of sepsis.  BCx are pending. His CXR is negative.  While he has abd pain with some rebound, his LFTs do not show an obstructive pattern and he has no jaundice to suggest ascending cholangitis.  Would repeat his LFTs tomorrow.  GI to see him today. Will continue zosyn and vanco for now.  No need for levofloxacin. Will await BCx. Follow CBC and fever curve. If he remains hypotensive, would give IVF boluses and consider transfer to CCU for pressors. If his renal function improves and his abd pain persists, will consider contrasted CT of abd/pelvis.   Electronic Signatures: Omeka Holben, Rosalyn GessMichael E (MD) (Signed on 05-Sep-13 14:14)  Authored   Last Updated: 05-Sep-13 14:31 by Amiley Shishido, Rosalyn GessMichael E (MD)

## 2015-01-01 NOTE — Consult Note (Signed)
PATIENT NAME:  [name], [name] Ernest#:  [id number] DATE OF BIRTH:  02-23-1971  DATE OF CONSULTATION:  05/19/2012  CONSULTING PHYSICIAN:  Payton Emerald, NP/Paul Oh, MD ATTENDING: Dr Bridgett Larsson  REASON FOR CONSULTATION: Elevation in liver function tests.     HISTORY OF PRESENT ILLNESS: [name] is a 44 year old Caucasian gentleman who is well known to our practice. He has significant past medical history of PTSD, substance abuse, hyperlipidemia, hypertension, coronary artery disease, osteoarthritis, bipolar disorder, GERD, type 2 diabetes, chronic obstructive pulmonary disease, peptic ulcer disease with history of upper GI bleed, methicillin-resistant Staphylococcus aureus, and thrombolytic thrombocytopenia. He underwent laparoscopic cholecystectomy 10 days ago at [facility]. I do not have those records available to review but patient states that he did have gallstones as well as a common bile duct stone. He was discharged home and had been doing well up until 2 days ago with onset of right upper quadrant abdominal pain. States he had been experiencing dizziness. He had fallen a couple of times. Significant other states that she took his blood pressure; it was 59/33 at home thus EMS was called. Patient states he was supposed to have followed up at [facility] this Tuesday and did not have transportation to get there. States that he was also supposed to follow up at the [facility] tomorrow at the pain clinic for chronic pain management as he had been out of morphine sulfate 15 mg 1 tablet twice a day as well as Dilaudid 4 mg every 4 hours for the past 2 days prior to his admission. One week prior to having surgery he was diagnosed with a foot infection and had been on antibiotic therapy at that time. He is not discharged home with antibiotics according to patient, presented with a WBC count of 27.0.   Currently the pain to right upper quadrant of abdomen is a 5 to 6 a scale of 1 to 10. He was  experiencing nausea which is now controlled. No vomiting at home.  Questionable low-grade fever, associated chills and sweats. Bowels move on average once a day. Was experiencing chest pain, nitroglycerin resolved discomfort.  He has remained on Prilosec twice a day since he has been admitted and denies any symptoms of reflux.   HOME MEDICATIONS:  1. Aspirin 81 mg a day. 2. Hydromorphone 4 mg every 4 hours, has been out for 2 days. 3. Isosorbide mononitrate 30 mg once a day.  4. Nitroglycerin 0.4 mg 1 every 5 minutes p.r.n. chest pain. 5. Carbamazepine 100 mg 2 tablets once a day. 6. Gabapentin 600 mg 1 tablet 3 times a day. 7. Morphine 500 mg twice a day. 8. Pravastatin 10 mg at bedtime.  9. Quetiapine 200 mg at bedtime.  10. Coreg 12.5 mg 1/2 twice a day. 11. Albuterol inhaler 1 puff every 6 hours as needed for shortness of breath. 12. Docusate 100 mg 2 tablets twice daily.  13. Omeprazole 20 mg once a day.  14. Cholecalciferol 1000 international units once a day.   ALLERGIES: Demerol, anaphylactic reaction. Erythromycin causes generalized body swelling.   PAST MEDICAL HISTORY:  1. PTSD. 2. Substance abuse with cocaine, remote use, quit 6 months ago.  3. Hyperlipidemia.  4. Hypertension.  5. Coronary artery disease, status post cardiac arrest, medically managed. 6. Osteoarthritis. 7. Bipolar disorder. 8. Gastroesophageal reflux disease. 9. Type 2 diabetes. 10. Chronic obstructive pulmonary disease. 11. Bleeding peptic ulcer disease, prior history of GI bleed. 12. History of MRSA. 13. History of  thrombolytic thrombocytopenia, status post plasma exchange.   PAST SURGICAL HISTORY:  1. Left hand surgery.  2. Laparoscopic cholecystectomy August 2013.   FAMILY HISTORY: Reviewed.  Noncontributory.   SOCIAL HISTORY: Resides with family. Active smoker 1 pack of cigarettes a day, reformed polysubstance abuse user.  Remote cocaine use 6 months ago. No alcohol use. On disability.      REVIEW OF SYSTEMS: CONSTITUTIONAL: Significant for fatigue, weakness. No weight loss, weight gain. Positive for fevers, chills, sweats. ENT: No blurred vision, redness, discharge.  ENT: No tinnitus. No hearing loss. RESPIRATORY: Cough, nonproductive, no wheezing. CARDIOVASCULAR: Significant for chest pain. See HPI. GI: See HPI.  GU: No dysuria, hematuria, no hesitancy. HEME/LYMPH: No anemia, easy bruising or bleeding. SKIN: No rashes. No lesions. MUSCULOSKELETAL: No neuralgias or myalgias. NEUROLOGIC: No numbness or tingling. PSYCH: No depression. No anxiety.   PHYSICAL EXAMINATION:  VITAL SIGNS: Temperature is 98.1, pulse is 83, respirations are 20, blood pressure is 99/64, pulse oximetry is 95%.   GENERAL: Well-developed, well-nourished, 44 year old Caucasian gentleman, no acute distress noted, ambulating well on room upon arrival.   HEENT: Normocephalic, atraumatic. Pupils equal, reactive to light. Conjunctiva clear. Sclera anicteric.   NECK: Supple. Trachea midline. No lymphadenopathy or thyromegaly.   PULMONARY: Symmetric rise and fall of chest. Clear to auscultation throughout.   CARDIOVASCULAR: Regular rhythm, S1, S2. No murmurs, no gallops.   ABDOMEN: Soft, nondistended. Bowel sounds in 4 quadrants. Mild discomfort to right upper quadrant. No rebound tenderness. No bruits. No masses.   RECTAL: Deferred.   MUSCULOSKELETAL: Moving all 4 extremities. No contractures. No clubbing.   PSYCH: Alert and oriented x4. Memory grossly intact. Appropriate affect and mood.   NEUROLOGIC: No gross neurological deficits.    EXTREMITIES: No edema bilaterally to lower quadrants.   LABORATORY/DIAGNOSTIC: Chemistry panel 05/18/2012: BUN 19, glucose is 207, creatinine is 2.56, sodium is 135. EGFR 30, lipase is 41. Hepatic panel: Albumin is 3.1, alkaline phosphatase was 185, AST was 200, and ALT is 98. Noted rise in transaminase levels from 2 weeks ago where alkaline phosphatase was 141, AST was 23,  and ALT was 17. Cardiac enzymes first in series: CK is 60 with a CK-MB of less than 0.5. Troponin is less than 0.02. Second in series, CK is 50, CK-MB is 0.6, and troponin less than 0.02. Urine drug screen unremarkable. CBC: WBC count was 27, RBC was 3.26, hemoglobin 10.5, hematocrit 31.5, neutrophil number elevated at 26.1 with a lymphocyte of 0.4. Urinalysis unremarkable.   EKG: Normal sinus rhythm. Chest x-ray, PA view: No acute cardiopulmonary disease, stable appearance. CT scan without contrast: No evidence of obstruction or inflammatory abnormalities.   IMPRESSION: Abdominal pain, right upper quadrant, with evidence of leukocytosis on admission. Status post laparoscopic cholecystectomy approximately 10 days ago. Known history of coronary artery disease, chronic obstructive pulmonary disease, and diabetes mellitus.    PLAN:  The patient's presentation was discussed with Dr. Verdie Shire. Recommendation is to proceed with a HIDA scan tomorrow to assess for evidence of bile leak given presentation of right upper quadrant abdominal pain with elevation in WBC count. Order placed.  Medical release form to be signed to obtain medical records from [facility], specifically in reference to his recent surgery, inclusive of imaging study results. I will continue to monitor his status as well as his laboratory studies. Pain is better with him receiving pain management during hospitalization.   These services provided by Payton Emerald, NP under collaborative agreement with Verdie Shire, MD.  ____________________________ Payton Emerald, NP dsh:vtd D: 05/19/2012 18:10:09 ET T: 05/20/2012 10:05:32 ET JOB#: [id number]  cc: Payton Emerald, NP, <Dictator> Payton Emerald MD ELECTRONICALLY SIGNED 05/24/2012 14:35

## 2015-01-01 NOTE — Discharge Summary (Signed)
PATIENT NAME:  [name], [name] MR#:  [id number] DATE OF BIRTH:  1970/10/17  DATE OF ADMISSION:  05/19/2012 DATE OF DISCHARGE:  05/22/2012  DISCHARGE DIAGNOSES:  1. Sepsis-like syndrome.  2. History of recent cholecystectomy.  3. History of diabetes.  4. Hypertension.  5. History of thrombotic thrombocytopenic purpura.  6. History of chronic obstructive pulmonary disease. 7. Peptic ulcer disease. 8. Bipolar disorder.  9. History of coronary artery disease. 10. History of chronic back pain. 11. History of prior cocaine abuse.   CONSULTATIONS:  1. ID consult with Dr. Leavy CellaBlocker. 2. GI consult with Dr. Bluford Kaufmannh.   DISCHARGE MEDICATIONS:  1. Levaquin 500 mg daily for 10 days.  2. Flagyl 500 mg every eight hours for 10 days. 3. Aspirin 81 mg daily.  4. Dilaudid 4 mg q.4 hours p.r.n., only 20 tablets will be given for prescription and he will follow up with his primary doctor.  5. Imdur 30 mg daily. 6. Nitroglycerin 0.4 mg sublingual p.r.n. for chest pain. 7. Tegretol 100 mg 2 tablets daily.  8. Neurontin 600 mg p.o. t.i.d.  9. Metformin 500 mg p.o. b.i.d. Patient can start metformin from tomorrow.  10. Pravastatin 10 mg at bedtime.  11. Seroquel 200 mg at bedtime.  12. Coreg 12.5 mg 1/2 tablet two times a day, that is 6.25 mg p.o. b.i.d.  13. Albuterol 1 puff every six hours.  14. Docusate 100 mg as needed twice daily. 15. Omeprazole 20 mg p.o. daily.   HOSPITAL COURSE: This is a 44 year old male came in because of abdominal pain, dizziness and falls. Patient had history of cholecystectomy 10 days ago before he was admitted here. Patient had some chills and weakness and nausea and had a fall with dizziness and syncope at home. Patient did not have any seizure. Look at the history and physical for full details. Patient is admitted for sepsis. Patient's blood pressure was 92/54 on admission, heart rate 82, temperature, not recorded. White count was 27 on admission and BUN was normal. Creatinine  2.5. Patient admitted for syncope likely secondary to fever and also possible sepsis. Patient admitted to hospitalist service and fluids were started and also IV antibiotics were started with Zosyn and vancomycin. He was admitted for sepsis-like syndrome along with acute renal failure due to dehydration. Metformin was stopped and also his blood pressure medications were held because of hypotension and IV antibiotics were started. Patient's EKG showed normal sinus rhythm. Lipase 41. Troponin less than 0.02. BUN 19, creatinine 2.56 on admission. LFTs were also up: ALT 98, AST 200 and alkaline phosphatase 185. Patient had a CAT scan of the abdomen which showed no evidence of obstructive or inflammatory abnormalities. Patient also had a HIDA scan which is within normal limits. Patient's blood cultures have been negative and seen by Dr. Leavy CellaBlocker who suggested continuing the antibiotics and follow the cultures and GI consult. Patient's blood cultures have been negative. Fever curve is coming down and CBC is down from 20 to 12 today and patient blood pressure improved and also renal failure improved and creatinine back to baseline. Patient's chest x-ray is negative for any infection. Patient's HIDA scan did not show any biliary leak. Patient feeling well, started on diet, tolerating regular diet. No vomiting. Patient's white count, as I mentioned, 12.9. Afebrile. Blood pressure is normal. Started back on Coreg. IV fluids were decreased and renal function improved. Patient's creatinine 1.31 yesterday. The plan is to discharge him today with Levaquin and Flagyl for 10 days according to ID  recommendation and follow up with his primary doctor. Patient requested pain medicine for his back pain until he gets the appointment so I am writing for 20 tablets of Dilaudid and patient can continue on the Coreg and nitroglycerin and statins for his coronary artery disease and for diabetes. He did not get metformin because of renal  insufficiency while he was here but he can resume the metformin tomorrow as his kidney function improved.   Patient's condition is stable.   TIME SPENT ON DISCHARGE PREPARATION: More than 30 minutes. ____________________________ Katha Hamming, MD sk:cms D: 05/22/2012 08:30:07 ET T: 05/24/2012 11:18:28 ET  JOB#: [id number] Katha Hamming MD ELECTRONICALLY SIGNED 06/06/2012 14:03

## 2015-01-01 NOTE — Consult Note (Signed)
Pt seen and examined. See Dawn Harrison's notes. Looks surprisingly well for a patient with high WBC and questionable cholangitis.. Need to remove [facility] operative note to see if CBD evaluated. HiDA ordered to r/o bile leak. Will follow. Thanks.  Electronic Signatures: Lutricia Feilh, Vyla Pint (MD)  (Signed on 05-Sep-13 21:24)  Authored  Last Updated: 05-Sep-13 21:24 by Lutricia Feilh, Lachell Rochette (MD)

## 2015-01-01 NOTE — Consult Note (Signed)
Brief Consult Note: Diagnosis: Bipolar disorder NOS.   Patient was seen by consultant.   Consult note dictated.   Recommend further assessment or treatment.   Orders entered.   Discussed with Attending MD.   Comments: [name] has a h/o bipolar. He came to ER after he pulled his toenails with symptoms suggestive of hypomania in the context of medication noncompliance. He was restarted on medications. Tolerated them well. He is no longer suicidal or homicidal.   PLAN: 1. The patient no longer meets criteria for IVC. I will terminate proceedings. Please discharge as appropriate.   2. He is to continue all medications as prescribed by his providers at the [facility]. No Rx necessary.    3. He will follow up with his psychiatrist at the [facility].   4. His wife will pick him up.  Electronic Signatures: Kristine LineaPucilowska, Avrielle Fry (MD)  (Signed 26-Dec-13 11:54)  Authored: Brief Consult Note   Last Updated: 26-Dec-13 11:54 by Kristine LineaPucilowska, Iverson Sees (MD)

## 2015-01-01 NOTE — H&P (Signed)
PATIENT NAME:  [name], [name] MR#:  [id number] DATE OF BIRTH:  04/15/1971  DATE OF ADMISSION:  04/27/2012  PRIMARY CARE PHYSICIAN: He has no local doctor, he sees a physician at [facility].   CHIEF COMPLAINT: Right leg cellulitis.   HISTORY OF PRESENT ILLNESS: [name] is 44 year old Caucasian male with history of bipolar disorder and posttraumatic stress disorder. The patient developed rash located at the dorsum of the right foot about three days ago. This had progressed to develop cellulitis associated with serous oozing of fluid with swelling from the right foot. The rash and cellulitis had extended up the right leg to below the knee earlier. When symptoms worsened, he decided to come to the emergency department for evaluation. He reports some chills, but no fever. It is worth to mention that the patient had pulled out all the nails of his right foot and similarly on the left foot. He does not give me a reason why he did that, but indicates that he has this habit.   REVIEW OF SYSTEMS: CONSTITUTIONAL: Denies any fever, but he has little chills. No fatigue. EYES: No blurring of vision. No double vision. ENT: No hearing impairment. No sore throat. No dysphagia. CARDIOVASCULAR: No chest pain. No shortness of breath. No syncope. RESPIRATORY: No cough. No chest pain. No shortness of breath. GASTROINTESTINAL: No abdominal pain. No vomiting. No diarrhea. GENITOURINARY: No dysuria. No frequency of urination. MUSCULOSKELETAL: No joint pain or swelling. No muscular pain or swelling. INTEGUMENTARY: He had rash that developed cellulitis at the dorsum of the right foot extending up to the right leg. He has no ulcers, but he has self-inflicted superficial abrasions on different parts of his body. NEUROLOGY: No focal weakness. No seizure activity. No headache. PSYCHIATRY: Has bipolar manic depressive illness, also anxiety. He has also history of posttraumatic stress disorder. ENDOCRINE: No polyuria or polydipsia, no heat  or cold intolerance.   PAST MEDICAL HISTORY:  1. Diabetes mellitus type 2. 2. Bipolar manic depressive illness. 3. Posttraumatic stress disorder with depression.  4. History of chronic abuse of cocaine. 5. History of (TTP) thrombotic thrombocytopenic purpura. He had his first episode of TTP in April 2012 and required plasma exchange therapy at [facility].  6. Tobacco abuse.  7. Hypertension. 8. Peptic ulcer disease with history of gastrointestinal bleed or upper gastrointestinal bleed. 9. Hyperlipidemia.  10. Gastroesophageal reflux disease.  11. History of coronary artery disease. 12. Chronic knee joint pain. 13. History of suicide attempts in the past and multiple psychiatric admissions and hospitalizations.  14. Chronic pain syndrome.   PAST SURGICAL HISTORY:  1. Left wrist surgery. 2. Bilateral knee arthroscopic surgery.  FAMILY HISTORY: The patient is adopted and he has no information about his family.   SOCIAL HISTORY: He is married and living with his wife. 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 He also worked for several years for the [employer]. He served in the [place].  SOCIAL HABITS: Chronic smoker of one pack per day since age of 52. No alcoholism. He denies current use of cocaine; the last time was in April of this year. He has not used marijuana for quite some time, for about several years so far.   ADMISSION MEDICATIONS:  1. Vitamin D3 2000 units a day. 2. Seroquel 200 mg once a day. 3. Pravastatin 20 mg a day. 4. Nitroglycerin 0.4 mg p.r.n.  5. Metformin 500 mg twice a day. 6. Imdur 30 mg  twice a day.  7. Gabapentin 300 mg two capsules three times daily. 8. Coreg 6.25 mg twice a day. 9. Carbamazepine 200 mg twice a day.  10. Aspirin 81 mg a day. 11. Albuterol p.r.n.   ALLERGIES: Erythromycin and Demerol. In his old records, penicillin was reported causing hives and rash, however, the patient denies. He  indicates and confirms that he never had allergy to penicillin.   PHYSICAL EXAMINATION:   VITAL SIGNS: Blood pressure 114/65, respiratory rate 20, pulse 97, temperature 99.3, and oxygen saturation 98%.   GENERAL APPEARANCE: Young male lying in bed in no acute distress.   HEAD: No pallor. No icterus. No cyanosis.   EARS, NOSE, AND THROAT: Hearing was normal. Nasal mucosa, lips, AND tongue were normal.   EYES: Normal iris and conjunctivae.  Pupils are about 5 mm, equal and reactive to light.   NECK: Supple. Trachea at midline. No thyromegaly. No cervical lymphadenopathy. No masses.   HEART: Normal S1 and S2. No S3 or S4. No murmur. No gallop. No carotid bruits.   RESPIRATORY: Normal breathing pattern without use of accessory muscles. No rales. No wheezing.   ABDOMEN: Soft without tenderness. No hepatosplenomegaly. No masses. No hernias.   SKIN: Redness over the dorsum of the right foot with serous oozing associated with soft tissue swelling. The erythema is extending along the right leg up to the knee area.   MUSCULOSKELETAL: No joint swelling. No clubbing.   NEUROLOGIC: Cranial nerves II through XII are intact. No focal motor deficit.   PSYCHIATRY: The patient is alert and oriented x3. The patient does not keep eye contact. Mood and affect were flat.   LABS/RADIOLOGIC STUDIES: Doppler ultrasound of the right leg was negative for deep vein thrombosis. However, there is right distal greater saphenous vein thrombophlebitis.   Serum glucose 151, BUN 10, creatinine 1.1, sodium 137, potassium 3.5, albumin 3.2, bilirubin 0.3, alkaline phosphatase 141, AST 23, and ALT 17. CBC showed white count 10,000, hemoglobin 11, hematocrit 33, and platelet count 227.   ASSESSMENT:  1. Cellulitis of the right leg self-inflicted after pulling all toenails.  2. Superficial thrombophlebitis without evidence of deep vein thrombosis.  3. Bipolar disorder and posttraumatic stress disorder  4. Diabetes  mellitus, type 2.  5. History of systemic hypertension. 6. Coronary artery disease. 7. Cocaine abuse in the past and marijuana. 8. Tobacco abuse.  9. Gastroesophageal reflux disease. 10. Hyperlipidemia.  11. History of thrombotic thrombocytopenic purpura.  12. History of recurrent skin infection with MRSA.   PLAN: We will admit the patient to the medical floor. We will treat him with vancomycin since there is a prior history of MRSA infections. Deep vein thrombosis prophylaxis with Lovenox 40 mg subcutaneous once a day. Continue home medications as listed above. Obtain psychiatric consultation to evaluate his weird habit of pulling out his nails. The patient needs to quit smoking. I placed him on a nicotine patch.   TIME SPENT EVALUATING THIS PATIENT: More than 50 minutes including reviewing his medical records and his psychiatric illnesses.  ____________________________ Carney CornersAmir M. Rudene Rearwish, MD amd:slb D: 04/27/2012 00:51:08 ET     T: 04/27/2012 07:30:59 ET       JOB#: [id number] cc: Carney CornersAmir M. Rudene Rearwish, MD, <Dictator> Zollie ScaleAMIR M Brantly Kalman MD ELECTRONICALLY SIGNED 04/27/2012 22:23

## 2015-01-01 NOTE — Consult Note (Signed)
PATIENT NAME:  [name], [name] MR#:  [id number] DATE OF BIRTH:  [birth year]  DATE OF CONSULTATION:  04/27/2012  REFERRING PHYSICIAN:  Dr. Marlaine Hind  CONSULTING PHYSICIAN:  Doralee Albino. Maryruth Bun, MD  REASON FOR CONSULTATION: Patient is repeatedly picking at his toenails.   IDENTIFYING INFORMATION: [name] is a 44 year old married Caucasian male currently living with his wife in the [place] area. He is unemployed and on disability. He is 100% service connected due to PTSD related to prior [employer] experience.   HISTORY OF PRESENT ILLNESS: [name] is a 44 year old married Caucasian male with a history of bipolar disorder and PTSD related to prior military trauma who was admitted to the medicine service with right lower extremity cellulitis. Psychiatry was consulted as the patient had apparently been picking at his toenails repeatedly possibly leading to the cellulitis of his right lower extremity. During the interview the patient was fully alert and oriented to time, place, and situation. He denied any major mood symptoms including worsening depressive symptoms, suicidal thoughts or psychotic symptoms. The patient had just recently been discharged from [facility] inpatient psychiatry in April 2013 on a combination of Tegretol, Seroquel, Haldol and trazodone. Since that time he has completed a residential substance abuse treatment program at the [facility] from 06/11 to 07/16 and has been following with his outpatient psychiatrist at the [facility]. He is no longer on the Haldol does take Tegretol, Seroquel and trazodone. In addition, he also as Risperdal p.r.n. to help with anxiety and psychosis if needed. The patient himself says that he picks at his toenails because they get really sore inside of his shoes. His wife has been bugging him to go to a podiatrist but he has not made an appointment. He says that even though he picks at them he knows that makes the pain worse but he has  a hard time dealing with the pain. He picks at them about twice a week for five minutes each time. The patient says he has been doing this for over 5 or 6 years. The patient denies that he is trying to hurt himself when he picks at this toenails and denies any suicidal intent. He says that his mood has been fairly good since he left the [facility] and he has been clean from all illicit drugs since 07/16. He denies any current psychotic symptoms including auditory or visual hallucinations and says his PTSD is under control with his current medications.   PAST PSYCHIATRIC HISTORY: Patient has been hospitalized at the [facility] and [place] multiple times. He believes he has been hospitalized about once a year since 1996. He sees Dr. Velora Mediate at the [facility]. Patient was given diagnosis of bipolar disorder in the past as well as PTSD related to prior [place] [employer] experience. He does have a history of three suicide attempts in the past as well. He has been tried on multiple antidepressants in the past including Depakote, lithium, Zyprexa, Haldol, Prozac and Risperdal. He is currently on a combination of Tegretol and Seroquel with a p.r.n. Risperdal if needed. In addition, he also as trazodone 100 mg at bedtime.   SUBSTANCE ABUSE HISTORY: The patient does have a history of cocaine dependence over the past 10 years, but reports being clean from cocaine since he entered the [facility] [place] substance abuse treatment program in early June. He does report a history of IV drug use in the past. He has tried marijuana in the past but denies using  on a regular basis. He denies any history of any alcohol, opiate, or stimulant use. He does smoke 1 pack of cigarettes per day and has been smoking since the age of 38.   FAMILY PSYCHIATRIC HISTORY: Patient reports being adopted.   PAST MEDICAL HISTORY:  1. Hypertension. 2. Diabetes. 3. Right lower extremity cellulitis. 4. TTP.  5. Hyperlipidemia.  6. History of skin  graft surgery on both his legs in the past. 7. History of left hand surgery three years ago.  8. He denies any prior history of any TBI or seizures.   OUTPATIENT MEDICATIONS: Outpatient medications prior to admission include:  1. Tegretol 200 mg p.o. b.i.d.  2. Seroquel 200 mg p.o. at bedtime.  3. Pravastatin 20 mg p.o. daily. 4. Nitroglycerin 0.4 mg p.r.n.  5. Vitamin D3 2000 units a day.  6. Metformin 500 mg p.o. b.i.d.  7. Imdur 30 mg p.o. b.i.d.  8. Gabapentin 300 mg 2 capsules 3 times a day. 9. Coreg 6.25 mg p.o. b.i.d.  10. Aspirin 81 mg p.o. daily. 11. Albuterol p.r.n.   ALLERGIES: Erythromycin and Demerol.   SOCIAL HISTORY: Patient reports being born and raised in the [place] area. He was raised by his adoptive parents. He did not know his biological parents. He denies any history of any physical or sexual abuse. 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 He was in the [employer] for six years and had an honorable discharge. He is currently 100% service connected with the [employer]. The patient is married to his second wife for the past 13 years and denies any significant marital conflict. He does not have any children. Patient currently lives in the [place] area with his wife.   LEGAL HISTORY: Patient spent 30 days in jail for writing bad checks in the past.   MENTAL STATUS EXAM: [name] is a 44 year old Caucasian male who is lying comfortably in his hospital bed in a hospital gown. He had a goatee and gray hair. He was fully alert and oriented to time, place, and situation. Speech was regular rate and rhythm, fluent and coherent. Mood was described as being "okay" and affect was full and congruent. He was able to smile and laugh appropriately. Thought processes were linear, logical, and goal-directed. He denied any current suicidal or homicidal thoughts. He denied any current auditory or visual hallucinations. He denied any paranoid thoughts or  delusions. Attention and concentration were fairly good. Judgment and insight were good. Recall was three out of three initially and three out of three after five minutes. Cognition was grossly intact. Intellect was average. The patient did not have any difficulty with understanding proverbs. Abstraction was good.   SUICIDE RISK ASSESSMENT: At this time [name] remains at a very low risk of harm to self and others as he has been able to be clean of drugs for the past one month and denies any major mood symptoms. Urine tox, however, is still pending. The patient denies any risk of harm to self and others.   REVIEW OF SYSTEMS: CONSTITUTIONAL: He denies any weakness, fatigue or weight changes. He denies any fever, chills, or night sweats. HEAD: He denies headaches or dizziness. EYES: He denies any diplopia or blurred vision. ENT: He denies any neck pain or throat pain. He denies any difficulty swallowing. RESPIRATORY: He denies any shortness of breath or cough. CARDIOVASCULAR: He denies any chest pain, orthopnea. GASTROINTESTINAL: He denies any nausea, vomiting, or abdominal pain.  He denies any change in bowel movements. GENITOURINARY: He denies incontinence or problems with frequency of urine. ENDOCRINE: He denies any heat or cold intolerance. LYMPHATIC: He denies any anemia or easy bruising. MUSCULOSKELETAL: He does complain of chronic back pain as well as bilateral arthritic knee pain. He also complains of right foot pain secondary to cellulitis infection. NEUROLOGIC: No tingling or weakness. Gait is fairly steady on a normal basis but currently patient has right foot infection. PSYCHIATRIC: Please see history of present illness.   PHYSICAL EXAMINATION:  VITAL SIGNS: Blood pressure 114/73, heart rate 71, respirations 18, temperature 98.1. Please see initial physical exam as completed by admitting physician, Dr. Marlaine HindAmir Darwish.   LABORATORY, DIAGNOSTIC, AND RADIOLOGICAL DATA: BMP within normal limits.  Glucose 151, alkaline phosphatase 141, AST 23, ALT 17, WBC 10.2, hemoglobin 11.4, platelet count 227. Ultrasound of his right lower extremity shows right distal greater saphenous vein thrombophlebitis but no evidence of deep venous thrombosis. Right foot x-ray showed no definitive fracture or dislocation.   AXIS I:  1. Bipolar disorder not otherwise specified. 2. PTSD, chronic. 3. Cocaine abuse.  4. Nicotine dependence.   AXIS II: Deferred.   AXIS III:  1. Right lower extremity cellulitis.  2. Hypertension.  3. Diabetes.  4. TTP.  5. Hyperlipidemia.   AXIS IV: Moderate-chronic mental illness, history of noncompliance in the past, comorbid substance use.   AXIS V: GAF at present equals 55 to 60.   ASSESSMENT AND TREATMENT RECOMMENDATIONS: [name] is a 44 year old married Caucasian male with a history of PTSD and bipolar disorder as well as cocaine dependence now with one month of sobriety per the patient who was admitted to the medicine service with right lower extremity cellulitis. Psychiatry was consulted as the patient has been picking at his toenails. The patient says that this has been an ongoing problem for years and he has not been able to go to a podiatrist. He does express some insight into the fact that his pain worsens when he picks at it but says he does pick at it about twice a week for about five minutes to help relieve the pain. He does know that it later causes additional pain, however. He is denying any other obsessive or compulsive behaviors and denies any severe anxiety prior to picking at his toenails. He denies any current major mood symptoms including depressive symptoms or manic symptoms. He denies any current psychotic symptoms. He denies any intent to harm himself or others and says he is not trying to kill himself or hurt himself when he picks at his toes. Patient was repeatedly told that this may increase infection. He appeared to verbalize understanding. Will not  change current psychotropic medication regimen that the patient is on other than adding the p.r.n. hydroxyzine 50 mg every eight hours if needed to help with possibly some anxiety to prevent him from picking at his toenails. He is very [facility] and will follow up there at the time of discharge. At this time he is not in imminent danger to himself or others necessitating inpatient psychiatric treatment.  ____________________________ Doralee AlbinoAarti K. Maryruth BunKapur, MD akk:cms D: 04/27/2012 13:37:19 ET T: 04/27/2012 13:58:57 ET JOB#: [id number]  cc: Aarti K. Maryruth BunKapur, MD, <Dictator> Darliss RidgelAARTI K KAPUR MD ELECTRONICALLY SIGNED 04/28/2012 3:31

## 2015-01-01 NOTE — Discharge Summary (Signed)
PATIENT NAME:  [name], [name] MR#:  [id number] DATE OF BIRTH:  [birth year]  DATE OF ADMISSION:  04/27/2012 DATE OF DISCHARGE:  04/28/2012  DIAGNOSES: 1. Lower extremity cellulitis, self-inflicted, after the patient pulled his toenails. 2. Superficial thrombophlebitis of right distal greater saphenous vein. 3. Bipolar disorder.  4. Posttraumatic stress disorder. 5. Diabetes. 6. Hypertension. 7. Coronary artery disease. 8. Smoking. 9. History of thrombotic thrombocytopenia purpura, status post plasma exchange. 10. History of peptic ulcer disease with gastrointestinal bleed. 11. Gastroesophageal reflux disease. 12. Chronic pain syndrome.   DISPOSITION: The patient is being discharged home.   DIET: Low-sodium, 1800 calorie ADA diet.   ACTIVITY: As tolerated. Follow up with PCP in 1 to 2 weeks after discharge.   DISCHARGE MEDICATIONS:  1. Seroquel 200 mg at bedtime.  2. Carbamazepine 200 mg b.i.d.  3. Metformin 500 mg b.i.d.  4. Pravachol 20 mg half tablet once a day at bedtime.  5. Neurontin 300 mg 2 capsules t.i.d.   6. Vitamin D3, 2000 international units 1 capsule daily.  7. Albuterol 90 mcg inhaled 1 puff q.6 hours p.r.n.  8. Aspirin 81 mg daily.  9. Coreg 6.25 mg b.i.d.  10. Imdur 30 mg b.i.d.  11. Nitroglycerin p.r.n.  12. Hydroxyzine 50 mg q.6 hours p.r.n.  13. Keflex 500 mg q.8 hours for 7 days.   CONSULTATION: Psychiatry consultation with Dr. Maryruth BunKapur.  RESULTS: Right lower extremity Doppler: No evidence of deep vein thrombosis, right distal greater saphenous vein thrombophlebitis. Right foot x-ray: No abnormality. Microbiology: Blood cultures no growth so far. CBC normal other than hemoglobin of 11.4. Glucose 151. Rest of CMP normal. ALP 141.  Urine drug screen positive for cocaine and opiates.   HOSPITAL COURSE: The patient is a 44 year old male with past medical history of posttraumatic stress disorder, diabetes, depression, bipolar disorder with history of suicidal  attempts and multiple psychiatric admissions, polysubstance abuse who presented with bilateral lower extremity swelling, pain and oozing. Patient recently pulled out his toenails. He was admitted with a diagnosis of cellulitis. He was afebrile with normal white count. Initially he was treated with IV antibiotics and has been switched to oral antibiotics by the time of discharge.  Lower extremity Doppler showed no evidence of deep vein thrombosis. The patient did have right distal greater saphenous vein superficial thrombophlebitis. The patient has extensive psychiatric history and has had suicidal attempts and multiple psychiatric admissions in the past. He was in habit of pulling his toenails. Therefore, a psychiatry consultation was obtained.  Patient was found to be stable on his current psychiatric medications, and no changes were made. The patient is being discharged home in a stable condition.   TIME SPENT: 45 minutes.    ____________________________ Darrick MeigsSangeeta Sheryll Dymek, MD sp:vtd D: 04/28/2012 15:41:37 ET T: 04/29/2012 11:41:05 ET JOB#: [id number]  cc: Darrick MeigsSangeeta Alistar Mcenery, MD, <Dictator> Darrick MeigsSANGEETA Crist Kruszka MD ELECTRONICALLY SIGNED 04/29/2012 15:01

## 2015-01-01 NOTE — Consult Note (Signed)
Pt now in room. Feels better. LEss pain. Some epig/RUQ tenderness. Follow Dr. Sharrell KuBlocker's recommendations. See my notes from earlier today. Dr. Mechele CollinElliott will see patient over the weekend. Thanks.  Electronic Signatures: Lutricia Feilh, Gretta Samons (MD)  (Signed on 06-Sep-13 14:45)  Authored  Last Updated: 06-Sep-13 14:45 by Lutricia Feilh, Javeion Cannedy (MD)

## 2015-01-04 NOTE — H&P (Signed)
PATIENT NAME:  Coralee NorthCRADDOCK, Travoris MR#:  [id number] DATE OF BIRTH:  [birth year]  DATE OF ADMISSION:  01/24/2013  PRIMARY CARE PHYSICIAN: Nonlocal.   REFERRING PHYSICIAN: Dr. Bayard Malesandolph Brown.   CHIEF COMPLAINT: Right face, right upper extremity and right lower extremity weakness.   HISTORY OF PRESENT ILLNESS: The patient is a 44 year old Caucasian male with history of cocaine abuse and multiple other medical problems is presenting to the ER with a chief complaint of right facial and right upper/lower extremity weakness since 10:00 p.m. last night. The patient admits taking cocaine yesterday. Complaining of headache, blurry vision and some swallowing difficulty. He is also reporting that is having difficulty in putting the words together and focusing. The patient comes into the ER. CAT scan of the head was done which showed no acute findings. Hospitalist team is called to admit the patient to rule out stroke. A one-time dose of aspirin was given in the ER.  PAST MEDICAL HISTORY: History of post traumatic stress disorder, chronic low back pain, COPD, peptic ulcer disease with a prior history of GI bleed, a history of MRSA, history of thrombolytic thrombocytopenia status post plasma exchange, osteoarthritis, bipolar disorder, GERD, type 2 diabetes mellitus, coronary artery disease status post cardiac arrest, the patient had a stress test two years ago and thinks it was normal, hypertension, hyperlipidemia, a history of substance abuse including cocaine, history of TIA.  PAST SURGICAL HISTORY: Recent cholecystectomy, left hand surgery.   ALLERGIES: To MORPHINE, DEMEROL, ERYTHROMYCIN.  PSYCHOSOCIAL HISTORY: Lives with wife. Still smokes one pack a day. History of substance abuse and last intake of cocaine was yesterday. Urine drug screen is pending. Denies any alcohol. The patient is on Disability.   FAMILY HISTORY: Hypertension runs in his family.  REVIEW OF SYSTEMS: CONSTITUTIONAL: Denies any fever,  fatigue, weakness, weight loss or weight gain.  EYES: Complaining of blurry vision. Denies any glaucoma, cataracts.  EARS, NOSE, THROAT: No epistaxis, discharge, postnasal drip or sinus pain. Complaining of kind of difficulty in swallowing.  RESPIRATORY: Has history of COPD. Denies any hemoptysis, dyspnea.  CARDIOVASCULAR: No chest pain, palpitations or syncope.  GASTROINTESTINAL: No nausea, vomiting, diarrhea. Has history of peptic ulcer disease with history of GI bleed in the past.  GENITOURINARY: No dysuria or hematuria.  ENDOCRINE: No polyuria, nocturia or thyroid problems. HEMATOLOGIC/LYMPHATIC: Has a strong history of thrombocytopenia status post plasma exchange in the past. INTEGUMENTARY: No acne, rash, lesions.  MUSCULOSKELETAL: No gout. No swelling. Chronic low back pain is present. NEUROLOGIC: Has previous history of TIAs. Denies any history of vertigo, dementia or migraines.  PSYCHIATRIC: Has history of posttraumatic stress disorder and bipolar disorder. No history of ADD or OCD.   PHYSICAL EXAMINATION:  VITAL SIGNS: Temperature 98.5, pulse 89, respirations 18, blood pressure 98/53, pulse ox is 98%.  GENERAL APPEARANCE: Not in any acute distress. Moderately built and moderately nourished.  HEENT: Normocephalic, atraumatic. Pupils are equally reactive to light and accommodation. No scleral icterus. No conjunctival injection. Extraocular movements are intact. No sinus tenderness. No postnasal drip. No deviation of the angle of the mouth. No pharyngeal exudates.  NECK: Supple. No JVD. No thyromegaly. No lymphadenopathy.  LUNGS: Clear to auscultation bilaterally. No accessory muscle use and no anterior chest wall tenderness on palpation.  CARDIAC: S1, S2 normal. Regular rate and rhythm. No murmurs.  GASTROINTESTINAL: Soft. Bowel sounds are positive in all four quadrants. Nontender, nondistended. No masses felt. No hepatosplenomegaly.  NEUROLOGIC: Awake, alert, oriented x 3. Cranial  nerves II through XII were  grossly intact. Motor: Right upper extremity is 2-3/5. Right lower extremity motor is 2-3/5. Decreased touch sensation in both right upper and lower extremities. Decreased touch sensation in the right side of the face. Left upper extremity and left lower extremity motor 5/5. Sensory is intact in left upper and lower extremity. Reflexes are 2+.  SKIN: Warm to touch. Normal turgor. No rashes. No lesions.  EXTREMITIES: No edema. No cyanosis. No clubbing.  LABORATORY DATA AND IMAGING STUDIES: CAT scan of the head: No acute findings. The 12-lead EKG: Normal sinus rhythm, normal PR and QRS intervals. No acute ST-T wave changes. Glucose 104, BUN 9, creatinine 1.35, sodium 138, potassium 4.1, chloride 104, CO2 of 24, GFR greater than 60, anion gap 10, serum osmolality 275, calcium 8.7, serum alcohol 10. WBC 8.5, hemoglobin 10.1, hematocrit 29.3, platelets 329. Total protein 7.3, albumin 3.1, alkaline phosphatase 228. AST 25, ALT 31.   ASSESSMENT: A 44 year old male who comes into the Emergency Room with a chief complaint of right facial weakness associated with right upper extremity and right lower extremity weakness since last night at 10:00 p.m., will be admitted with the following assessment and plan. He admits to taking cocaine. CT head is negative.   PLAN:  1. Right facial and right-sided weakness, questionable TIA versus CVA. differentials malingering. Will give him aspirin, statin and will get neuro check. Will get stroke workup with MRI of the brain, carotid Dopplers and 2-D echocardiogram. Neurology consult is placed.  2. Acute renal insufficiency. Will provide IV fluids and hold metformin. Avoid nephrotoxins. Monitor renal function closely. 3. Cocaine abuse. The patient is counseled and will be benefited with outpatient psych followup. Urine drug screen is pending.  4. Chronic low back pain. Resume his home medication.  5. Hypertension and hyperlipidemia. Resume home  medications and statin.  6. History of diabetes mellitus. Currently, the patient is on sliding scale and hold the metformin in view of renal insufficiency.  7. COPD. Stable. 8. GI prophylaxis and DVT prophylaxis will be provided.  9. He is FULL CODE.  Total time spent on admission is 50 minutes.   The diagnosis and plan of care was discussed with the patient. He is aware of the plan.    ____________________________ Ramonita Lab, MD ag:es D: 01/24/2013 07:16:43 ET T: 01/24/2013 08:59:20 ET JOB#: [id number]  cc: Ramonita Lab, MD, <Dictator> Ramonita Lab MD ELECTRONICALLY SIGNED 02/01/2013 1:09

## 2015-01-04 NOTE — Discharge Summary (Signed)
PATIENT NAME:  [name], [name] MR#:  [id number] DATE OF BIRTH:  Feb 16, 1971  DATE OF ADMISSION:  09/05/2013 DATE OF DISCHARGE:  09/06/2013  PRIMARY CARE PHYSICIAN:  Steele SizerMark A. Crissman, MD  CONSULTATION:  Marcina MillardAlexander Paraschos, MD, cardiology.    DISCHARGE DIAGNOSES:  Chest pain, angina, coronary artery disease, hypertension, diabetes, tobacco abuse.   CONDITION: Stable.   CODE STATUS: Full code.   HOME MEDICATIONS: Please refer to the [facility] physician discharge instruction medication list.   DIET: Low sodium, low fat, low cholesterol diet.   ACTIVITY: As tolerated.   FOLLOWUP CARE: Follow with PCP within 1 to 2 weeks. Follow up with Dr. Darrold JunkerParaschos within 1 to 2 weeks.   REASON FOR ADMISSION: Chest pain.   HISTORY OF PRESENT ILLNESS:  The patient is a 44 year old Caucasian male with a history of MI due to cocaine who presented to the ED with chest pain which was 10 out of 10, associated with nausea, diaphoresis and not relieved with nitroglycerin. For detailed history and physical examination, please refer to the admission note dictated by Dr. Mordecai MaesSanchez.   LABORATORY, DIAGNOSTIC AND RADIOLOGICAL DATA ON ADMISSION: Creatinine 0.99, BUN 7, glucose 81, electrolytes normal. EKG no ST depression or elevation, normal sinus rhythm. Chest x-ray no acute pulmonary disease. Urinalysis normal.   HOSPITAL COURSE:   1. Chest pain, possible stable angina with history of CAD after admission. The patient was suspected to have ACS; however, troponin level has been negative for 3 sets. The patient was initially treated with a heparin drip and aspirin and statin. Since the patient's troponin is negative, the patient underwent a stress test which is normal. Dr. Darrold JunkerParaschos evaluated the patient and suggested the patient is stable and can be discharged to home and follow up with him as outpatient. The patient has still has mild chest pain today after stress test but his vital signs are stable. Physical examination is  unremarkable.  2.  For hypertension, blood pressure is controlled with Coreg, Imdur.  3.  Diabetes has been treated with sliding scale.  4.  Tobacco abuse.  He was counseled for smoking cessation. Nicotine patch was given.   The patient is clinically stable and will be discharged home today. I discussed the patient's discharge plan with the patient, case manager, nurse and Dr. Darrold JunkerParaschos.   TIME SPENT: About 38 minutes.   ____________________________ Shaune PollackQing Deleon Passe, MD qc:cs D: 09/06/2013 16:12:02 ET T: 09/06/2013 19:02:11 ET JOB#: [id number]  cc: Shaune PollackQing Delray Reza, MD, <Dictator> Shaune PollackQING Arvid Marengo MD ELECTRONICALLY SIGNED 09/07/2013 17:35

## 2015-01-04 NOTE — Discharge Summary (Signed)
PATIENT NAME:  [name], [name] MR#:  [id number] DATE OF BIRTH:  1971/03/30  DATE OF ADMISSION:  07/07/2013 DATE OF DISCHARGE:  07/07/2013  ADMITTING PHYSICIAN: Cletis Athens. Hower, MD  DISCHARGING PHYSICIAN: Enid Baas, MD  PRIMARY CARE PHYSICIAN: At [facility].  PAIN MANAGEMENT CLINIC: Also at [facility].   CONSULTATIONS IN HOSPITAL: None.   DISCHARGE DIAGNOSES: 1.  Chest pain, likely muscular in nature.  2.  Narcotic-seeking behavior.  3.  History of significant cocaine use resulting in cardiac arrest in the past. 4.  Coronary artery disease.  5.  Hypertension.  6.  Posttraumatic stress disorder.  7.  Depression with multiple psychiatric hospitalizations.  8.  Polysubstance abuse.  9.  Chronic pain syndrome.   DISCHARGE HOME MEDICATIONS:  1.  Nucynta  ER 200 mg p.o. b.i.d.  2.  Tramadol 50 mg p.o. q.4 to 6 hours p.r.n. for pain.  3.  Ferrous fumarate 325 mg p.o. daily.  4.  Colace 100 mg p.o. b.i.d. as needed.  5.  Benadryl 25 mg 4 times a day as needed for itching.  6.  Oxycodone 30 mg p.o. q.4 hours p.r.n. for pain.  7.  Isosorbide mononitrate 60 mg p.o. b.i.d.  8.  Nitroglycerin sublingual 0.4 mg p.r.n. for chest pain.  9.  Carbamazepine 100 mg p.o. daily.  10.  Pravastatin 20 mg p.o. daily.  11.  Quetiapine 400 mg p.o. at bedtime.  12.  Coreg 6.25 mg p.o. b.i.d.  13.  Protonix 40 mg p.o. daily.   DISCHARGE DIET: Low-sodium diet.   DISCHARGE ACTIVITY: As tolerated.    FOLLOWUP INSTRUCTIONS: PCP followup in 1 to 2 weeks.   LABORATORY AND IMAGING STUDIES PRIOR TO DISCHARGE: WBC 5.7, hemoglobin 11.2, hematocrit 32.7, platelet count 223.   Sodium 139, potassium 4.4, chloride 107, bicarbonate 27, BUN 10, creatinine 1.3, glucose 111 and calcium of 8.6.   ALT 12, AST 18, alkaline phosphatase 127, total bilirubin 0.1 and albumin of 6.8.   Troponins x 3 remained negative.   Echo Doppler showing LV ejection fraction 50% to 55%, low-normal global left ventricular systolic  function, mild mitral valve regurgitation, increased left ventricular internal cavity size.   Urine tox screen positive for marijuana.   Chest x-ray on admission showing clear lung fields. Mild infiltrate in the left lung base cannot be excluded.   BRIEF HOSPITAL COURSE: [name] is a 44 year old Caucasian male with past medical history significant for polysubstance abuse, depression, posttraumatic stress disorder, multiple psych hospitalization and narcotic-seeking behavior, presented to the hospital secondary to chest pain.   Chest pain, likely muscular in nature or pain medication seeking. He was admitted to telemetry. His cardiac enzymes x 3 were monitored, which were negative. The patient had an echocardiogram, which did not show any wall motion abnormality, so he is being discharged. Of note, the patient was getting oxycodone in the hospital p.r.n. and the nurse has seen him spitting out the medication as soon as the nurse tried to leave the room, and he was rolling his pain medication in his sock and also in his clothes. Again, no narcotics were prescribed at the time of discharge and the patient was advised to follow up with his pain management clinic at [facility], which he states he has an appointment this coming Wednesday, which is July 12, 2013.  All his other home medications were continued without any changes.   DISCHARGE CONDITION: Stable.   DISCHARGE DISPOSITION: Home.   TIME SPENT ON DISCHARGE: 45 minutes.   ____________________________ Donnita Falls  Nemiah CommanderKalisetti, MD rk:jm D: 07/07/2013 15:15:27 ET T: 07/07/2013 16:50:31 ET JOB#: [id number]  cc: Enid Baasadhika Davidson Palmieri, MD, <Dictator> Enid BaasADHIKA Leo Weyandt MD ELECTRONICALLY SIGNED 07/25/2013 13:47

## 2015-01-04 NOTE — Consult Note (Signed)
PATIENT NAME:  [name], PAVON MR#:  [id number] DATE OF BIRTH:  [birth year]  DATE OF CONSULTATION:  01/24/2013  CONSULTING PHYSICIAN:  Pauletta Browns, MD  REASON FOR CONSULTATION:  Right-sided weakness.    HISTORY OF PRESENTING ILLNESS: This is a 44 year old Caucasian male with past medical history of hypertension, diabetes, hyperlipidemia, coronary artery disease, history of cardiac arrest, history of stroke about 2 years ago with residual right-sided weakness, presents with complaining of sudden onset of right-sided numbness and right-sided weakness with acute onset starting at 10:00 p.m. last night. The patient states that he was watching TV at 9:59. He was completely normal, and at 10:00 p.m. he had sudden onset of sensory deficit, stating that he had decreased sensation on the right side of the face, right arm and right leg followed by weakness in the right upper and right lower extremity, along with aphasia, which has improved significantly. The patient states he had difficulty getting his words out yesterday evening. The patient did not come to the hospital for 4 to 5 hours stating that he was hoping his symptoms would resolve, and that is the reason he was out of the tPA window.  The patient is also a chronic cocaine user, last time cocaine use was this Saturday. The patient had a stroke about 2 years ago with residual right upper and right lower extremity weakness, but his weakness yesterday was much more profound compared to the weakness he had prior.   PAST MEDICAL HISTORY: He has a history of posttraumatic stress disorder, chronic low back pain, COPD, peptic ulcer disease, history of MRSA, osteoarthritis, bipolar, GERD, type 2 diabetes, coronary artery disease, history of cardiac arrest, hypertension, hyperlipidemia, drug abuse, specifically cocaine, last use on Saturday, and history of stroke 2 years ago with residual right upper and right lower extremity weakness.   PAST SURGICAL HISTORY:  Includes cholecystectomy and left hand surgery.   ALLERGIES: INCLUDE MORPHINE, DEMEROL, ERYTHROMYCIN.   SOCIAL HISTORY: The patient lives with his wife. Smokes 1 pack per day. Polysubstance abuse, last took cocaine Saturday night. Social alcohol drinker.   FAMILY HISTORY: Chronic hypertension, diabetes.   REVIEW OF SYSTEMS:  CONSTITUTIONAL:  Denies fever, fatigue, weakness, weight loss.  HEENT:  Eyes: No signs of blurry vision. No sore throat. No epistaxis, discharge or postnasal drip.  RESPIRATORY: No history of shortness of breath.  CARDIOVASCULAR: No chest pain, palpitation.  GASTROINTESTINAL: No nausea. No vomiting.   GENITOURINARY: No dysuria or hematuria.  ENDOCRINE: No heat or cold intolerance. No polyuria, nocturia or thyroid problems. HEMATOLOGIC: History of thrombocytopenia.  MUSCULOSKELETAL: Chronic low back pain.  NEUROLOGIC:  Probably he has chronic stroke with residual right-sided weakness.  PSYCHIATRIC: History of bipolar disorder.   IMAGING AND LABORATORY DATA: The patient had a CAT scan of the head with small right temporal lobe encephalomalacia. No acute intracranial abnormalities found.  The patient's blood work includes glucose serum 104, BUN 9, creatinine 1.35, sodium was 138. White blood cell count is 8.5, hemoglobin is 10.1, hematocrit is 29.3.   PHYSICAL EXAMINATION: VITAL SIGNS: Temperature 98.1, pulse 75, respirations 18, blood pressure 127/85, pulse ox is 98% on room air.  HEENT: Normocephalic, atraumatic. CHEST:  Lungs are clear to auscultation.  CARDIAC: S1, S2 no murmurs.  EXTREMITIES:  No signs of edema.  GASTROINTESTINAL:  Abdomen soft, nontender, nondistended.  NEUROLOGIC: The patient's speech appears to be fluent. No signs of aphasia or dysarthria. The patient is alert, awake and oriented to place, time and location, the reason why  he is in the hospital. Visual fields appear to be intact. Cranial nerves: 3 mm to 2 mm, reactive bilaterally. Facial  sensation is decreased on the right side. No signs of facial droop. Tongue is midline. Uvula elevates symmetrically. Shoulder shrug intact. Motor strength: The patient has a right pronator drift; right upper extremity is 4/5, right lower extremity is 4/5, compared to the left upper and left lower extremity that are 5/5. Decreased sensation to light touch and temperature on right side of the face, right upper, right lower extremity. Coordination: Finger-to-nose intact. Reflexes 1+, diminished throughout. Gait not assessed.   IMPRESSION: This is a 44 year old male with significant past medical history and vascular abnormality, hypertension, hyperlipidemia, diabetes, smoking history and cocaine user on aspirin 81 at home and statin, presenting with sudden onset of right upper and right lower extremity weakness, along with sensory changes in his right side of the face, right upper and right lower extremities. The patient had chronic stroke about 2 years ago and has residual weakness of right upper and right lower extremities, but the current weakness is much more profound.   PLAN: Recommend a stroke workup; MRI of the brain, echocardiogram, carotid Doppler. Increase his aspirin to 325. Continue his home dose statin. Obtain hemoglobin A1c, lipid panel, outpatient psych followup for cocaine use, and the patient states [facility].   Thank you. It was a pleasure seeing this patient. Please call with any questions.   ____________________________ Pauletta BrownsYuriy Taiz Bickle, MD yz:dmm D: 01/24/2013 11:32:48 ET T: 01/24/2013 11:47:07 ET JOB#: [id number]  cc: Pauletta BrownsYuriy Meckenzie Balsley, MD, <Dictator> Pauletta BrownsYURIY Helana Macbride MD ELECTRONICALLY SIGNED 02/10/2013 14:37

## 2015-01-04 NOTE — Consult Note (Signed)
PATIENT NAME:  [name], [name] MR#:  [id number] DATE OF BIRTH:  19-Apr-1971  DATE OF CONSULTATION:  09/06/2013  REFERRING PHYSICIAN:  Dr. Mordecai Maes CONSULTING PHYSICIAN:  Marcina Millard, MD  PRIMARY CARE PHYSICIAN: Dr. Dossie Arbour.  CHIEF COMPLAINT: Chest pain.   REASON FOR CONSULTATION: Consultation requested for evaluation of chest pain.   HISTORY OF PRESENT ILLNESS: The patient is a 44 year old gentleman with a prior history of cocaine abuse, who is admitted with chief complaint of chest pain. The patient has had previous history of myocardial infarction secondary to cocaine abuse. According to the patient,  cardiac catheterization did not reveal evidence for high-grade coronary artery disease. The patient was in his usual state of health until day of admission when he developed chest pain following supper. The patient describes it as 10 out of 10. He presented to [facility] Emergency Room where EKG did not reveal any acute ischemic ST-T wave changes. The patient was admitted to telemetry where he ruled out for myocardial infarction by CPK, isoenzymes and troponin. The patient is scheduled to undergo Lexiscan sestamibi study today.   PAST MEDICAL HISTORY:  1.  Cocaine abuse.  2.  History of myocardial infarction, secondary to cocaine abuse.  3.  Hypertension.  4.  Hyperlipidemia.  5.  Peptic ulcer disease.  6.  Diabetes.  7.  COPD.  8.  History of TIA. 9.  TTP.   MEDICATIONS ON ADMISSION: Pravastatin 20 mg daily, Imdur 60 mg daily, carvedilol 12.5 mg b.i.d., tramadol 50 mg q.4 to 6 hours p.r.n., Protonix 40 mg daily, promethazine 25 mg q.6 hours, oxycodone 30 mg q.4 hours, Nucynta 200 mg b.i.d., nitroglycerin p.r.n., ferrous sulfate 325 mg daily, docusate 100 mg b.i.d., diphenhydramine p.r.n. and carbamazepine 100 mg daily,   SOCIAL HISTORY: The patient currently denies tobacco or EtOH abuse.   FAMILY HISTORY: No immediate family history of coronary artery disease. The patient is  adopted.  REVIEW OF SYSTEMS: CONSTITUTIONAL: No fever or chills. EYES: No blurry vision.  EARS: No hearing loss. RESPIRATORY: No shortness of breath. CARDIOVASCULAR: The patient had chest pain as described above. GASTROINTESTINAL: No nausea, vomiting, or diarrhea. GENITOURINARY: No dysuria or hematuria. ENDOCRINE: No polyuria or polydipsia. MUSCULOSKELETAL: No arthralgias or myalgias. NEUROLOGICAL: No focal muscle weakness or numbness. PSYCHOLOGICAL: No depression or anxiety.   PHYSICAL EXAMINATION:  VITAL SIGNS: Blood pressure 119/80, pulse 86, respirations 20, temperature 98, pulse oximetry 98%.  HEENT: Pupils equal and reactive to light and accommodation.  NECK: Supple without thyromegaly.  LUNGS: Clear.  HEART: Normal JVP. Normal PMI. Regular rate and rhythm. Normal S1, S2. No appreciable gallop, murmur, or rub.  ABDOMEN: Soft and nontender without hepatosplenomegaly.  EXTREMITIES: No cyanosis, clubbing, or edema. Pulses were intact bilaterally.  MUSCULOSKELETAL: Normal muscle tone.  NEUROLOGIC: The patient is alert and oriented x 3. Motor and sensory both grossly intact.   IMPRESSION: A 44 year old gentleman with a history of cocaine abuse, thrombocytopenic purpura with prior history of myocardial infarction without apparent underlying coronary artery disease. The patient presents with chest pain and has ruled out for myocardial infarction by CPK, isoenzymes and troponin. EKG is nondiagnostic.   RECOMMENDATIONS:  1.  I agree with overall current therapy.  2.  Would continue heparin drip for now.  3.  I agree with proceeding with Lexiscan sestamibi study.  4.  Further recommendations pending Lexiscan sestamibi results.   ____________________________ Marcina Millard, MD ap:aw D: 09/06/2013 12:39:26 ET T: 09/06/2013 13:01:33 ET JOB#: [id number]  cc: Marcina Millard, MD, <Dictator> Andersyn Fragoso  MD ELECTRONICALLY SIGNED 09/13/2013 8:39

## 2015-01-04 NOTE — H&P (Signed)
PATIENT NAME:  [name], [name] MR#:  [id number] DATE OF BIRTH:  Mar 15, 1971  DATE OF ADMISSION:  09/05/2013  PRIMARY CARE PHYSICIAN: Dr. Jeananne Rama.   PRIMARY CARDIOLOGIST: Recently transferred care from [facility] to [facility]. He has not met his cardiologist yet.   CHIEF COMPLAINT: Chest pain.   HISTORY OF PRESENT ILLNESS: This is a very nice 44 year old gentleman who has history of previous MIs due to cocaine use and of cardiovascular arrest with prolonged ICU stays twice.  The patient is here today with a history of chest pain that started at 7:15 the evening after supper. The patient was at home. He was resting, lying down on bed and he felt a squeezing pain, a level of the left side of the chest radiating into his left arm. The pain intensity was 10 out of 10,  associated with nausea diaphoresis and the pain did not relieve after taking 3 of his nitroglycerins. The patient called EMS. EMS brought him into the Emergency Department. Here, there are no acute changes on his EKG.  His first set of troponin is negative, but the patient continued to have significant excruciating pain for what he was put on a nitroglycerin drip. That has relieved his pain from an intensity of 10 out of 10 to 5 out of 10. The patient is admitted for evaluation of acute coronary syndrome.    REVIEW OF SYSTEMS:  CONSTITUTIONAL: No fever, fatigue, weight loss or weight gain. Positive night sweats.  EYES: No blurry vision, double vision, redness or inflammation.  EARS, NOSE, THROAT: No tinnitus, hearing loss or snoring.  RESPIRATORY: Occasional cough due to COPD, no recent flares. No wheezing. No hemoptysis. No painful respirations.  CARDIOVASCULAR: As mentioned above, chest pain. No orthopnea, no edema, no syncope.  GASTROINTESTINAL: No nausea, vomiting, abdominal pain, constipation, diarrhea.  GENITOURINARY: No dysuria or hematuria.  ENDOCRINE: No polyuria, polydipsia or polyphagia.  HEMATOLOGIC AND LYMPHATIC:  No  anemia, easy bruising or bleeding. The patient has history of TTP and he has been very well controlled. In the past, he has had Heparin without any problems and his platelet count is above 250, steady for several years. The patient is control with hematologic at [facility].   MUSCULOSKELETAL: Positive chronic hip pain and knee pain and back pain. No gout.  NEUROLOGIC: No numbness, tingling or CVA.  The patient had 2 TIAs. PSYCHIATRIC: No insomnia or depression. Positive PTSD, apparently.   PAST MEDICAL HISTORY: 1.  Hypertension.  2.  Hyperlipidemia.  3.  Status post 2 MIs secondary to cocaine use.  4.  History of GI bleeding.  5.  Hyperlipidemia.  6.  Peptic ulcer disease.  7.  PTSD.  8.  Cardiac arrest x 2.  9.  Type 2 diabetes.  10.  COPD.   11.  TIA 2 times.  12.  TTP.  13.  Compression fracture on lower back.  14.  Osteoarthritis of hips and knees.   FAMILY HISTORY: The patient is adopted.   ALLERGIES:  DEMEROL, ERYTHROMYCIN AND MORPHINE. DEMEROL PUT HIM IN THE ICU, MORPHINE GAVE HIM ITCHING BUT HE IS OKAY TO TAKE IT WITH BENADRYL, EYRTHOMYCIN NAUSEA AND VOMITING.   MEDICATIONS:  1.  Tramadol 50 mg every 4 to 6 hours. 2.  Catapine 400 mg once a day. 3.  Protonix 40 mg once a day. 4.  Promethazine 25 mg every 6 hours. 5.  Pravastatin 20 mg once a day. 6. Oxycodone 30 mg every 4 hours.  7.  Nucynta 200 mg  2 times a day. 8.  Nitroglycerin 0.4 mg every five minutes as needed for chest pain.  9.  Imdur 60 mg twice daily. 10.  Ferrous sulfate 325 mg once a day. 11.  Docusate 100 mg twice daily.  12.  Diphenhydramine as needed for itching. 13.  Coreg 12.5 mg twice daily. 14.  Carbamazepine 100 mg once a day.   PHYSICAL EXAMINATION: VITAL SIGNS: Blood pressure 113/69, pulse 84, respirations 20, temperature 98.5, oxygen saturation 94% on room air.  GENERAL: The patient is alert, oriented x 3, in no acute distress. No respiratory distress. Hemodynamically stable.  HEENT: Pupils are  equal and reactive. Extraocular movements are intact. Mucosa are moist. Anicteric sclerae. Pink conjunctivae. No oral lesions. No oropharyngeal exudates.  NECK: Supple. No JVD. No thyromegaly. No adenopathy. No carotid bruits.  CARDIOVASCULAR: Regular rate and rhythm. No murmurs, rubs or gallops are appreciated at this moment. No displacement of PMI. No reproducible tenderness to palpation of chest wall, including pressure into his sternum. No displacement of PMI.  LUNGS: Clear without any wheezing or crepitus. No use of accessory muscles.  ABDOMEN: Soft, nontender, nondistended. No hepatosplenomegaly. No masses. Bowel sounds are positive.  EXTREMITIES: No edema, cyanosis or clubbing.  VASCULAR: Pulses +2. Capillary refill less than 3.  SKIN: No rashes or petechiae.  LYMPHATIC: Negative for lymphadenopathy in neck or supraclavicular areas.  MUSCULOSKELETAL: No significant joint effusion or joint swelling.  NEUROLOGIC: Cranial nerves II through XII intact. The patient has a normal range of motion of extremities. Strength is equal in all 4 extremities. No focal findings.  PSYCHIATRIC: No significant anxiety or depression.   RESUTS:   EKG: No ST depression or elevation, normal sinus rhythm. Sodium is 134, BUN 7, creatinine 0.99, glucose 81; other electrolytes within normal limits. Total protein 8.5, albumin 3.5, alkaline phosphatase 175, troponin is 0.02. UDS is negative. White count 7.1, hemoglobin 13, platelet count 234, INR is 1. Urinalysis normal.   CHEST X-RAY: No acute pulmonary disease, left basilar atelectasis.   ASSESSMENT AND PLAN:  1.  A very nice 44 year old gentleman with history of previous 2 myocardial infarctions due to cocaine use, cardiac arrest as well, type 2 diabetes, chronic obstructive pulmonary disease, transient ischemic attack.   2.  Admitted with chest pain resistant to oral nitroglycerin, give him some nitroglycerin drip, has some improvement.  3.  Acute coronary  syndrome. The patient has significant history with 2 cardiac arrest due to myocardial infarction secondary to cocaine. I am not quite of the quality of his coronary arteries, although the patient has significant risk factors including tobacco,  hypercholesterolemia, diabetes and hypertension for what it is very likely that he has coronary artery disease. His previous MIs were secondary to basilar spasms, as far as I can tell.  4. The patient is admitted. He is going to be on a heparin drip. I am not going to give him a bolus since he has history of gastrointestinal bleeding and thrombocytopenia purpura.  The  patient is going to be CCU  He is going to be on nitroglycerin drip. We are going to add morphine and oxygen. Continue beta blocker and aspirin.  5.  The patient is stable at this moment. We are going to get cardiology to see him in the morning.  6.  The patient has a risk factor for severe cardiovascular complications and myocardial infarction, since he has been hospitalized in the past with multiple cardiac arrests. The patient is at risk of known cardiac  arrest.  7.  As far as his hypertension, continue treatment with Coreg. Continue Imdur for his coronary artery disease. The patient has history of unstable angina which happens whenever he walks more than four blocks. His chest pain today started with resting.  8.  Continue proton pump inhibitor for gastrointestinal prophylaxis.  9.  Chronic pain syndrome, Nucynta and OxyContin.  10.  Diabetes patient, on insulin sliding scale. He is usually diet-controlled.  11.  History of previous drug use. At this moment, he is not using any drugs.  12.  Tobacco abuse. Cessation for tobacco counseling has been given for over four minutes.   after reviewing his data1 he actually has not recieved any pain meds he was placed stright on NTG drip but he is able to tol morphine,. we game some and were able to take him off NTG drip. now he will go to Telemetry  instead ccu.    45 minutes. The patient is a full code    ____________________________ [place] Sink, MD rsg:NTS D: 09/05/2013 23:39:34 ET T: 09/06/2013 00:00:55 ET JOB#: [id number]  cc: Sheffield Sink, MD, <Dictator> Giada Schoppe America Brown MD ELECTRONICALLY SIGNED 09/06/2013 1:29

## 2015-01-04 NOTE — Discharge Summary (Signed)
PATIENT NAME:  [name], [name] MR#:  [id number] DATE OF BIRTH:  09-15-1970  DATE OF ADMISSION:  01/24/2013 DATE OF DISCHARGE:  01/25/2013  ADMITTING DIAGNOSIS: Right face, right upper extremity and right lower extremity weakness, as well as blurred vision.   DISCHARGE DIAGNOSES: 1.  Right face, right upper extremity and right lower extremity weakness, of unclear etiology. The patient's MRI negative for cerebrovascular accident, status post evaluation by neurology.  2. Right-sided leg pain, possibly mechanical in nature. He will need outpatient workup and evaluation. The patient able to ambulate.  3.  History of posttraumatic stress disorder.  4.  Chronic low back pain.  5.  Chronic obstructive pulmonary disease.  6.  Peptic ulcer disease, with prior history of gastrointestinal bleed.  7.  History of Methicillin-resistant Staphylococcus aureus.  8.  History of thrombotic thrombocytopenic purpura, with history of plasma exchange.  9.  Osteoarthritis.  10.  Bipolar disorder.  11.  Gastroesophageal reflux disease.  12. Diabetes, type 2.  13. Coronary artery disease, with history of cardiac arrest.  14. Hypertension.  15. Hyperlipidemia.  16.  History of polysubstance, include including cocaine.  17.  History of transient ischemic attack.  18.  Status post cholecystectomy.  19.  Status post left hand surgery.   PERTINENT LABS AND EVALUATIONS: Admitting glucose 104, BUN 9, creatinine 1.35, sodium 138, potassium 4.1, chloride 104, CO2 was 24.   His alcohol level was 0.010.   LFTs: Albumin 3.1, alk phos 228.   His WBC was 8.5, hemoglobin 10.1, platelet count was 329.   Echocardiogram of the heart showed LVEF, 45% to 50%, mild dilated left atrium, mild mitral valve regurg, mildly elevated pulmonary artery systolic pressure, mild tricuspid regurg.   TSH 1.69.   CT scan of the head showed no acute intracranial processes.   MRI of the brain showed no evidence of focal or acute  abnormalities noted.   Carotid Dopplers showed no significant carotid artery stenosis.   CONSULTANTS: Dr.  Irish Elders.  HOSPITAL COURSE: The patient is a 44 year old white male with multiple medical problems as stated above, who apparently had a CVA 2 years prior with residual right-sided weakness, presented with a sudden onset of right-sided numbness, right-sided weakness. The patient was seen in the ED and he was also seen by neurology. The patient was admitted for a possible CVA. He underwent an MRI of the brain, and that was negative for any acute CVA. His carotid Dopplers were negative as well as his echo. Did not show any thrombus. The patient still complained of some right lower extremity pain, but he stated that the numbness was resolved. It is unclear whether he truly had a TIA. At this time he is arranged to have some physical therapy for his right-sided weakness. He is stable for discharge.   DISCHARGE MEDICATIONS: Nitroglycerin 0.4 sublingual p.r.n., isosorbide mononitrate  30 mg daily, carvedilol 12.5, one-half tablet p.o. b.i.d., metformin 1000, 1 tablet p.o. b.i.d., Nucynta ER 200 mg 1 tablet p.o. b.i.d., tramadol 50 q.4 to 6 as needed, carbamazepine 100,  1 tablet p.o. daily, iron sulfate 325 mg p.o. daily, venlafaxine 75 p.o. daily, quetiapine 400,  1 tablet p.o. at bedtime, trazodone 100 at bedtime, pravastatin 10 mg at bedtime, omeprazole  40 daily, docusate 100 1 tablet p.o. b.i.d., gabapentin 300, 2 capsules 3 times a day, diphenhydramine 25 q.6 p.r.n., Norco 325/10 mg 1 tablet p.o. 3 times a day as needed for pain.   The patient is to continue aspirin as taken  previously.   HOME HEALTH: Yes. OT and nurse aid are referred.  DIET: Low-sodium, carbohydrate-controlled diet.   ACTIVITY: As tolerated.   FOLLOWUP: With primary M.D. in 1 to 2 weeks.   ACTIVITY: As tolerated with a walker.   Note: Thirty-two minutes spent on the discharge.       ____________________________ Lafonda Mosses. Posey Pronto, MD shp:dm D: 01/26/2013 14:17:43 ET T: 01/26/2013 15:01:46 ET JOB#: [id number]  cc: Camar Guyton H. Posey Pronto, MD, <Dictator> Alric Seton MD ELECTRONICALLY SIGNED 01/31/2013 12:40

## 2015-01-04 NOTE — H&P (Signed)
PATIENT NAME:  [name], [name] MR#:  [id number] DATE OF BIRTH:  1971-07-04  DATE OF ADMISSION:  07/07/2013  REFERRING PHYSICIAN:  Dr. Fanny Bien.   PRIMARY CARE PHYSICIAN:  Nonlocal.  He goes to [facility]  for hematology PCP as well as cardiology.   CHIEF COMPLAINT:  Chest pain.  HISTORY OF PRESENT ILLNESS:  This is a 44 year old gentleman with a past medical history TTP, has a history of GI bleeding from peptic ulcer disease, cardiac arrest x 2, hypertension, type 2 diabetes who is presenting with chest pain.  He had acute onset of left-sided chest pain at rest radiating to his neck and left arm described as chest tightness, 9 out of 10 in intensity.  No worsening or relieving factors.  He had associated nausea, however he denies any shortness of breath, palpitations or diaphoresis.  Symptoms occurred approximately 1 to 2 hours before presenting to the Emergency Department.  He took nitroglycerin x 4 sublingual tablets as well as aspirin without relief.  His symptoms decreased in the Emergency Department and then returned requiring nitroglycerin once more.  Initial EKG and cardiac enzymes are within normal limits.    REVIEW OF SYSTEMS: CONSTITUTIONAL:  Denies any fevers, fatigue, weakness.  EYES:  Denies blurred vision, double vision, eye pain.  EARS, NOSE, THROAT:  Denies tinnitus, ear pain, hearing loss.  RESPIRATORY:  Denies cough, wheeze, shortness of breath.  CARDIOVASCULAR:  Positive for chest pain as above.  Denies orthopnea, edema, palpitations.  GASTROINTESTINAL:  Nausea as above.  Denies vomiting, diarrhea, abdominal pain.  GENITOURINARY:  Denies dysuria, hematuria.  ENDOCRINE:  Denies nocturia or polyuria.   HEMATOLOGY AND LYMPHATIC:  Denies easy bruising or bleeding.  SKIN:  Denies any rash or lesions.  MUSCULOSKELETAL:  Denies current pain in his neck, back, shoulder, knee or hip.  NEUROLOGIC:  Denies any paralysis or paresthesias.  PSYCHIATRIC:  Denies any anxiety or depressive symptoms.   Otherwise, full review of systems performed by me is negative.   PAST MEDICAL HISTORY:  Hypertension, hyperlipidemia, GI bleed, peptic ulcer disease, posttraumatic stress disorder, cardiac arrest x 2, type 2 diabetes, COPD, TIA x 2 as well as TTP.   FAMILY HISTORY:  He is adopted and does not know any of his family history.   SOCIAL HISTORY:  Current every day tobacco use, a history of cocaine usage, states has not used for greater than one year.   ALLERGIES:  DEMEROL, ERYTHROMYCIN AND MORPHINE.   MEDICATIONS:  Carbamazepine 100 mg by mouth daily, Coreg 12.5 mg 1/2 tab by mouth twice daily, Benadryl 25 mg by mouth 4 times daily as needed for itching, Colace 100 mg by mouth twice daily as needed for constipation, ferrous fumarate 325 mg by mouth daily, Imdur 60 mg by mouth twice daily, nitroglycerin 0.4 mg sublingual tablets as needed every five minutes for chest pain, Nucynta extended release 200 mg by mouth twice daily, oxycodone 30 mg by mouth q. 4 hours as needed for pain, pravastatin 20 mg by mouth daily, Protonix 40 mg by mouth daily, quetiapine 400 mg by mouth at bedtime, tramadol 50 mg by mouth q. 4 to 6 hours as needed for pain.   PHYSICAL EXAMINATION: VITAL SIGNS:  Temperature 98.1, heart rate 76, respirations 20, blood pressure 120/83, saturating 99% on room air.  Weight 81.6 kg, BMI 23.8.  GENERAL:  Well-nourished, well-developed, Caucasian gentleman who is in no acute distress.  HEAD:  Normocephalic, atraumatic.  EYES:  Pupils equal, round, react to light as well as  accommodation.  Extraocular muscles intact.  No scleral icterus.  MOUTH:  Moist mucosal membranes.  Poor dentition.  No abscesses noted.  EARS, NOSE, THROAT:  Clear without exudates.  No external lesions noted.  NECK:  Supple.  No thyromegaly.  No nodules.  No JVD.  PULMONARY:  Clear to auscultation bilaterally without wheezes, rubs or rhonchi.  No use of accessory muscles.  Good respiratory effort.  CHEST:  Nontender  to palpation.  CARDIOVASCULAR:  S1, S2, regular rate and rhythm.  There is a 2 out of 6 systolic ejection murmur best heard at the left sternal border.  No edema noted.  Pedal pulses 2+ bilaterally.  ABDOMEN:  Soft, nontender, nondistended.  No masses.  No hepatosplenomegaly.  Positive bowel sounds.  MUSCULOSKELETAL:  No swelling, clubbing or edema.  Range of motion full in all extremities.  NEUROLOGICAL:  Cranial nerves II through XII intact.  No gross neurological deficits.  Sensation and reflexes intact.  SKIN:  No ulcerations, lesions, rashes or cyanosis.  Skin warm and dry.  Turgor is intact.  PSYCHIATRIC:  Mood and affect within normal limits.  Awake, alert, and oriented x 3.  Insight and judgment intact.   LABORATORY DATA:  EKG normal sinus rhythm, heart rate 76.  No ST or T wave abnormalities.  Sodium 139, potassium 4.4, chloride 107, bicarb 27, BUN 10, creatinine 1.33, glucose 111, troponin I less than 0.02.  WBC 5.7, hemoglobin 11.2, platelets 223.   ASSESSMENT AND PLAN:  A 44 year old gentleman with history of thrombotic thrombocytopenic purpura as well as a history of gastrointestinal bleed, cardiac arrest x 2, hypertension, diabetes, presenting with chest pain.  1.  Chest pain.  Initial enzymes and EKG within normal limits.  Trend cardiac enzymes.  Admit to telemetry.  He has already received aspirin and nitroglycerin.  2.  Coronary artery disease status post cardiac arrest.  Continue aspirin, Coreg, Imdur, pravastatin. 3.  Chronic pain, lumbago.  Continue home dosing of tapentadol as well as oxycodone.  4.  Thrombotic thrombocytopenic purpura, platelets are within normal limits.  No active disease at this time.  5.  DVT prophylaxis, SCD only.  Would avoid heparin in this patient given his history.  6.  The patient is FULL CODE.   TIME SPENT:  45 minutes.     ____________________________ Cletis Athensavid K. Hower, MD dkh:ea D: 07/07/2013 02:27:27 ET T: 07/07/2013 03:39:26  ET JOB#: [id number]  cc: Cletis Athensavid K. Hower, MD, <Dictator> DAVID Synetta ShadowK HOWER MD ELECTRONICALLY SIGNED 07/07/2013 20:06

## 2015-01-05 NOTE — Consult Note (Signed)
PATIENT NAME:  Ernest NorthCRADDOCK, Ashon MR#:  [id number] DATE OF BIRTH:  02/21/1971  DATE OF CONSULTATION:  02/19/2014  REQUESTING PHYSICIAN:  Dr. Sharyn CreamerMark Quale CONSULTING PHYSICIAN:  Gianpaolo Mindel B. Hilmer Aliberti, MD  REASON FOR CONSULTATION: To evaluate a suicidal patient.   IDENTIFYING DATA: This patient is a 44 year old male with history of bipolar depression and substance abuse.   CHIEF COMPLAINT: "I don't know why I feel so bad."   HISTORY OF PRESENT ILLNESS:  This patient has a long history of mental illness. He has been hospitalized in our hospital multiple times. He has VA privileges but prefers to come to a regular hospital when in need. He, however, gets all his medical and psychiatric care from the [facility]. He reports that he has been compliant with the medications prescribed by his [facility] psychiatrist but lately feels stressed out and depressed. He has passing suicidal ideation. He reportedly overdosed prior to coming here on EpiPen. The patient is well known to us. He appears to be more depressed when relapses on drugs, currently he is using cocaine, or when he runs out of his pain medication. He has been frequenting our Emergency Room for several weeks now.  Characteristically, he is always negative for narcotic pain killers that are prescribed at the [facility]. He reports poor sleep, decreased appetite, anhedonia, feeling of guilt, hopelessness, worthlessness, poor energy, poor memory and concentration, social isolation, crying spells, and thoughts of suicide that culminated in presumed suicide attempt. He denies psychotic symptoms. He is using cocaine. He denies heavy drinking,   PAST PSYCHIATRIC HISTORY: Multiple hospitalizations. There were several suicide attempts by overdose. He has periods of noncompliance with treatment. He has a history of substance abuse treatment but no longer can be accepted to [facility].  FAMILY PSYCHIATRIC HISTORY:  He was adopted.   PAST MEDICAL HISTORY: Diabetes,  hypertension, chronic pain, GERD.   ALLERGIES: DEMEROL, ERITHROMYCIN, MORPHINE, BEE STINGS.   MEDICATIONS ON ADMISSION: Wellbutrin XL 150 daily, Tegretol 100 mg daily, Coreg 12.5 mg twice daily, Zyrtec 10 mg at bedtime, Colace 100 mg twice daily, iron 324 mg daily, Levemir 10 units at bedtime, pantoprazole 40 mg in the morning, Pravachol 20 mg at bedtime, quetiapine 400 mg at bedtime, Nucynta 200 mg twice daily, oxycodone 30 mg 3 times daily, isosorbide mononitrate 60 mg twice daily.   SOCIAL HISTORY: He is his wife. He has a comfortable income.  Gets upset and sad when he spends his money on drugs. He has VA privileges.   REVIEW OF SYSTEMS:  CONSTITUTIONAL: No fevers or chills. No weight changes.  EYES: No double or blurred vision.  EARS, NOSE, AND THROAT:  No hearing loss.  RESPIRATORY: No shortness of breath or cough.  CARDIOVASCULAR: No chest pain or orthopnea.  GASTROINTESTINAL: No abdominal pain, nausea, vomiting, or diarrhea.  GENITOURINARY: No incontinence or frequency.  ENDOCRINE: No heat or cold intolerance.  LYMPHATIC: No anemia or easy bruising.  INTEGUMENT: No acne or rash.  MUSCULOSKELETAL: No muscle or joint pain.  NEUROLOGIC: No tingling or weakness.  PSYCHIATRIC: See history of present illness for details.   PHYSICAL EXAMINATION:  VITAL SIGNS: Blood pressure 113/64, pulse 84, respirations 18, temperature 98.  GENERAL: This is a slender well-developed male in no acute distress. The rest of the physical examination is deferred to his primary attending.   LABORATORY DATA: Chemistries are within normal limits. Blood glucose 100. Blood alcohol level is 0.147. LFTs within normal limits except for alkaline phosphatase of 151. Cardiac enzymes negative.  Urine toxicology screen is negative for substances. CBC within normal limits. Serum acetaminophen and salicylates are low.   RADIOLOGICAL DATA:  EKG: Normal sinus rhythm, T wave abnormality. Consider inferior ischemia. Abnormal  EKG.   MENTAL STATUS EXAMINATION: The patient is alert and oriented to person, place, time, and situation. He is pleasant, polite, and cooperative. He recognizes me from previous admission. He maintains good eye contact. His speech is of normal rhythm, rate, and volume. Mood is depressed with full affect. Thought process is logical and goal oriented. Thought content: He denies suicidal or homicidal ideation. There are no delusions of paranoia. There are no auditory or visual hallucinations. His cognition is grossly intact. He registers 3/3 and recalls 3/3 objects after 5 minutes.  His long-term and short-term memory are intact. He is of average intelligence and fund of knowledge. His insight and judgment are fair.   DIAGNOSES:  AXIS I: Bipolar disorder, depressed; posttraumatic stress disorder; chronic cocaine dependence; alcohol dependence.  AXIS II: Deferred.  AXIS III: Diabetes, gastroesophageal reflux disease, hypertension, chronic pain.  AXIS IV: Mental illness, substance abuse, treatment compliance.  AXIS V: Global assessment of functioning on the evaluation, 55.   PLAN:  1. The patient does not meet criteria for involuntary inpatient psychiatric commitment. Please discharge as appropriate.  2. He is to continue all his medications as prescribed at the [place].  3. He will follow up with his VA doctor and Dr. Arbie Cookey for CBT treatment at [facility].  4. The patient declines residential substance abuse treatment at [facility].  5. No pain medication was offered during this brief hospitalization and the patient tells me that his pain is better and no longer uses painkillers on a regular basis.    ____________________________ Ellin Goodie Jennet Maduro, MD jbp:dd/am D: 02/19/2014 17:31:17 ET T: 02/19/2014 20:11:44 ET JOB#: [id number]  cc: Ernest Hillock B. Jennet Maduro, MD, <Dictator> Shari Prows MD ELECTRONICALLY SIGNED 02/21/2014 6:58

## 2015-01-05 NOTE — H&P (Signed)
PATIENT NAME:  [name], [name] MR#:  [id number] DATE OF BIRTH:  1971-04-29  DATE OF ADMISSION:  11/30/2013 DATE OF EVALUATION: 12/01/2013  REFERRING PHYSICIAN: Emergency Room M.D.   ATTENDING PHYSICIAN: Kristine LineaJolanta Juanitta Earnhardt, M.D.   IDENTIFYING DATA: [name] is a 44 year old male with history of bipolar disorder and  cocaine addiction.   CHIEF COMPLAINT: "I'm tired of this shit."   HISTORY OF PRESENT ILLNESS: [name] has a long history of bipolar disorder. He was diagnosed in 2001. Almost every year in the spring, he has been hospitalized except for the past several years. His last hospitalization was in 2013. He has been doing well in the care of his [facility] psychiatrist. Lately, however, he feels stressed out, started skipping medications, and using more "crack" than usual. This created a conflict at home with his wife but also feelings of guilt, worthlessness, hopelessness.   He is most likely in a mixed episode because he has profound depressive symptoms with decreased appetite, anhedonia, social isolation, some crying spells, decreased memory and concentration, but also hyperactivity, out of character, irresponsive behavior, irritability, argumentativeness with his wife. He has been recently unstable to the point that the wife did not want to leave him home alone.   He has attempted suicide in the past and the wife was worried. The patient does express some suicide thoughts but does not have intention or a plan. He is mad that he spent money again on "crack" and does not know how to stop. He denies psychotic symptoms. He denies alcohol or benzodiazepine use. Cocaine is his drug of choice. He reports that ordinarily he does cocaine twice a week. Lately he spent much more money on drugs.   PAST PSYCHIATRIC HISTORY: As above, multiple psychiatric hospitalizations, 3 suicide attempts by overdose. He has been mostly compliant with [facility] the [facility]. He does have a history of substance abuse  treatment and has been in [facility] twice. He tried to go again but he was not accepted. It is unclear whether he reached the limit or could try to be accepted there again.   FAMILY PSYCHIATRIC HISTORY: The patient was adopted.   PAST MEDICAL HISTORY: Diabetes, hypertension, chronic pain, GERD.   ALLERGIES: DEMEROL, E-MYCIN, MORPHINE.   MEDICATIONS ON ADMISSION: Lantus 8 units at bedtime, NovoLog sliding-scale insulin, aspirin 325 mg daily, Nucynta ER 200 mg twice daily, oxycodone 30 mg four times daily as needed for breakthrough pain, isosorbide mononitrate 60 mg twice daily, Tegretol 10 mg in the morning, pravastatin 40 mg at bedtime, Seroquel 400 mg at bedtime, carvedilol 12.5 mg two times daily, Keflex 500 mg every 6 hours, Bactrim twice daily for 7 days, Protonix 40 mg daily.   SOCIAL HISTORY: The patient is married for the second time. He is disabled from bipolar and  Financial plannermilitary service. 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 He has no children. He reports a good enough income to support himself, his wife, and his habit.   REVIEW OF SYSTEMS:  CONSTITUTIONAL: No fevers or chills. Positive for fatigue.  EYES: No double or blurred vision.  ENT: No hearing loss.  RESPIRATORY: No shortness of breath or cough.  CARDIOVASCULAR: No chest pain or orthopnea.  GASTROINTESTINAL: No abdominal pain, nausea, vomiting, or diarrhea.  GENITOURINARY: No incontinence or frequency.  ENDOCRINE: No heat or cold intolerance.  LYMPHATIC: No anemia or easy bruising.  INTEGUMENTARY: No acne or rash. MUSCULOSKELETAL: Positive for pain.  NEUROLOGIC:  No tingling or weakness.  PSYCHIATRIC: See history of present illness for details.   PHYSICAL EXAMINATION:  VITAL SIGNS: Blood pressure 107/71, pulse 80, respirations 18, temperature 98.1.  GENERAL: A slender, unkempt male in no acute distress.  HEENT: The pupils are equal, round, and reactive to  light. Sclerae anicteric.  NECK: Supple. No thyromegaly.  LUNGS: Clear to auscultation. No dullness to percussion.  HEART: Regular rhythm and rate. No murmurs, rubs, or gallops.  ABDOMEN: Soft, nontender, nondistended. Positive bowel sounds.  MUSCULOSKELETAL: Normal muscle strength in all extremities.  SKIN: No rashes or bruises.  LYMPHATIC: No cervical adenopathy.  NEUROLOGIC: Cranial nerves II through XII are intact.   LABORATORY DATA: Chemistries are within normal limits except for creatinine of 1.54, sodium 132. Blood alcohol level is zero. LFTs within normal limits except alkaline phosphatase of 194. Troponin less than 0.02. Urine tox screen is positive for cocaine and opiates. CBC within normal limits. Urinalysis is not suggestive of urinary tract infection. Serum acetaminophen and salicylates are low.   EKG: Normal sinus rhythm. Normal EKG.   MENTAL STATUS EXAMINATION ON ADMISSION: The patient is alert and oriented to person, place, time and situation. He is pleasant, polite and cooperative. He recognizes me from previous admission. He maintains good eye contact. His speech is of normal rhythm, rate and volume. Mood is mad and sad with labile affect. Thought process is logical but slightly disorganized and contains racing thoughts. He expresses some disappointment and thoughts of hurting himself due to a relapse on substances and a difficult relationship with his wife. He does not have any homicidal thoughts. He denies delusions or paranoia or auditory or visual hallucinations but seems a little grandiose. His cognition is grossly intact. He registers three out of three and recalls three out of three objects after five minutes. He can spell "world" forwards and backwards. He knows the current president. He is intelligent with good fund of knowledge. His insight and judgment suffer.   SUICIDE RISK ASSESSMENT ON ADMISSION: This is a patient with a long history of bipolar disorder, mood  instability, psychotic symptoms, substance abuse, and multiple suicide attempts in the past, who was admitted in a mixed bipolar episode. He is at increased risk of suicide.   DIAGNOSES:  AXIS I: Schizoaffective disorder, bipolar type; posttraumatic stress disorder, chronic; cocaine dependence.  AXIS II: Deferred.  AXIS III: Diabetes, gastroesophageal reflux disease, hypertension, chronic pain.  AXIS IV: Mental illness, substance abuse, treatment compliance, marital.  AXIS V: Global assessment of functioning on admission, 25.   PLAN: The patient was admitted to [facility] behavioral medicine unit for safety, stabilization and medication management. He was initially placed on suicide precautions and was closely monitored for any unsafe behaviors. He underwent full psychiatric and risk assessment. He received pharmacotherapy, individual and group psychotherapy, substance abuse counseling, and support from therapeutic milieu.  1.  Suicidal ideation. The patient is able to contract for safety.  2.  Mood. We will continue Tegretol. He is on a very low dose - will check the level - and Seroquel, probably increase it for mood stabilization and psychotic symptoms.  3.  Cocaine dependence. The patient is not interested in substance abuse treatment here or [facility]. He would like to go to [facility] [place]. We may look into it; however, we already know that there is a process of accepting candidates.  4.  Medical. We will continue all medicines as prescribed in the community.  5.  Diabetes. We will restart Lantus  and NovoLog sliding scale.  6.  Chronic pain. We will continue medications as in the community. No prescriptions will be given.   DISPOSITION: He will return to home.     ____________________________ Ellin Goodie. Jennet Maduro, MD jbp:np D: 12/01/2013 21:14:21 ET T: 12/01/2013 22:20:10 ET JOB#: [id number]  cc: Laporcha Marchesi B. Jennet Maduro, MD, <Dictator> Shari Prows MD ELECTRONICALLY  SIGNED 12/06/2013 7:34

## 2015-01-05 NOTE — Consult Note (Signed)
Brief Consult Note: Diagnosis: Bipolar disorder NOS, Cocaine dependence.   Patient was seen by consultant.   Consult note dictated.   Recommend further assessment or treatment.   Orders entered.   Discussed with Attending MD.   Comments: [name] has a h/o bipolar. He came to ER after cocaine binge complaining of suicidal ideation. He is nmo longer suicidal or homicidal.   PLAN: 1. The patient no longer meets criteria for IVC. I will terminate proceedings. Please discharge as appropriate.   2. He is to continue all medications as prescribed by his providers at the [facility]. No Rx necessary.    3. [facility].  4. His wife will pick him up.  Electronic Signatures: Kristine LineaPucilowska, Josealfredo Adkins (MD)  (Signed 08-Jun-15 13:59)  Authored: Brief Consult Note   Last Updated: 08-Jun-15 13:59 by Kristine LineaPucilowska, Normajean Nash (MD)

## 2015-01-05 NOTE — Consult Note (Signed)
PATIENT NAME:  [name], [name] MR#:  [id number] DATE OF BIRTH:  November 13, 1970  DATE OF CONSULTATION:  06/11/2014  REFERRING PHYSICIAN:   CONSULTING PHYSICIAN:  Rhona RaiderMatthew Baxter. Tyshae Stair, DPM  REASON FOR CONSULTATION: Crepitus, infection, and gas in tissues to the right foot.   HISTORY OF PRESENT ILLNESS: The patient pulled a nail out of his right big toe about 3 weeks ago. He had some infection get started after that, [facility] they saw some crepitus and air in the tissues, so they did an I and D of that region approximately 2 weeks ago. That area has healed up, but he developed more gas in the tissues and he states he did not have enough gas [facility], so he came here and x-rays today showed gas in the tissues with palpatory crepitus of the tissues as well.   PAST MEDICAL HISTORY: TIA, myocardial infarct, cellulitis right foot and leg, substance abuse, PTSD, thrombocytopenic purpura, MRSA over the past 6 months, bleeding ulcer, hypercholesterolemia, hypertension, chronic knee pain, pancreatitis, GI bleed, post traumatic stress disorder, GERD, right foot surgery, left hand surgery, and bipolar disorder.   HOME MEDICATIONS: Include Seroquel 200 mg daily, Protonix 40 mg daily, pravastatin 20 mg daily, oxycodone 30 mg 4 times a day, Nucynta 100 mg 2 times a day, Lantus 100 units subcutaneous once a day, isosorbide mononitrate, ferrous fumarate supplement, docusate sodium, cetirizine 10 mg once a day, carvedilol 12.5 mg twice a day, carbamazepine 100 mg 3 times a day, and aspirin 325 mg daily.    LOWER EXTREMITY EXAMINATION: Shows not a lot of cellulitis, but palpable crepitus is noted across the top of the foot in about 3 or 4 different areas.    CLINICAL IMPRESSION: Uncertain as to exactly what is going on, but the crepitus warrants incision and drainage of the region which I will accomplish today. I will leave these areas open and pack them and see how well he does with  that and probably [facility].   ALLERGIES: E-MYCIN, DEMEROL, BEE STINGS, ANTIBIOTICS, ERYTHROMYCIN, AND MORPHINE.      ____________________________ Rhona RaiderMatthew Baxter. Jacari Kirsten, DPM mgt:bu D: 06/11/2014 18:42:33 ET T: 06/11/2014 19:43:15 ET JOB#: [id number]  cc: Rhona RaiderMatthew Baxter. Breylen Agyeman, DPM, <Dictator> Epimenio SarinMATTHEW Baxter Georgeana Oertel MD ELECTRONICALLY SIGNED 07/03/2014 10:08

## 2015-01-05 NOTE — H&P (Signed)
PATIENT NAME:  [name], [name] MR#:  [id number] DATE OF BIRTH:  04-09-1971  DATE OF ADMISSION:  06/11/2014  PRIMARY CARE PHYSICIAN: Steele SizerMark A. Crissman, MD    CHIEF COMPLAINT: Pain in the foot.   HISTORY OF PRESENT ILLNESS: This is a 44 year old male with a history of previous MIs due to cocaine use and cardiovascular arrest, bipolar affective disorder, who presents with the above complaint. Approximately 3 weeks ago, the patient says that he had surgery for an ingrown toenail in his right big toe. Over the past week, he has had increasing pain, redness, swelling, and tenderness of that right toe. He also had fever, chills, and joint pain. He was brought into the ER for further evaluation. In the ER, he had an x-ray, which showed gas and concern for necrotizing fasciitis. Dr. Orland Jarredroxler was called by the ER physician, Dr. Fanny BienQuale.  The plan is for the patient to go to the Operating Room this evening.   PAST MEDICAL HISTORY:  1.  TIAs.   2.  TTP.  3.  Hypertension.  4.  Diabetes.  5.  Cardiac arrest x2.  6.  History of MIs due to cocaine use.  7.  Cocaine abuse.  8.  Schizoaffective disorder, bipolar type.   9.  PTSD.  10. GERD.    MEDICATIONS:  1.  Aspirin 325 mg daily.  2.  Carbamazepine 100 mg 3 tablets t.i.d. 3.  Coreg 12.5 b.i.d.  4.  Cetirizine 10 mg at bedtime.  5.  Docusate 100 mg b.i.d. p.r.n.  6.  Ferrous fumarate 325 mg daily.  7.  Imdur 60 mg b.i.d.  8.  Lantus 10 units at bedtime.  9.  Nucynta 100 mg 3 tablets b.i.d.  10.  Oxycodone 30 mg 4 times a day p.r.n.  11.  Pravastatin 20 mg at bedtime.  12.  Protonix 40 mg daily.  13.  Seroquel 200 mg at bedtime.   PAST SURGICAL HISTORY:  1.  Cholecystectomy.  2.  Bilateral arthroscopy.   3.  Skin graft.   SOCIAL HISTORY:  The patient smokes 1 pack a day. Occasional alcohol. No IV drug use. He was using cocaine in the past.   FAMILY HISTORY: The patient is adopted.   ALLERGIES:  1.  DEMEROL ANAPHYLAXIS. 2.  ERYTHROMYCIN  ANAPHYLAXIS. 3.  ERYTHROMYCIN NAUSEA, VOMITING, AND DIARRHEA. 4.  MORPHINE, RASH.  5.  BEE STINGS ANAPHYLAXIS.  6.  ANT BITES ANAPHYLAXIS.   REVIEW OF SYSTEMS:  CONSTITUTIONAL: Positive fever, chills, fatigue, weakness.  EYES: No blurred or double vision, glaucoma, cataracts.   ENT:  No ear pain, hearing loss, seasonal allergies.  RESPIRATORY: No cough, wheezing, or COPD.   CARDIOVASCULAR:  No chest pain, orthopnea, edema, arrhythmia, dyspnea on exertion, or syncope.  GASTROINTESTINAL: Some nausea.  No vomiting, diarrhea, abdominal pain, melena, or ulcers.  GENITOURINARY:  No dysuria or hematuria.   ENDOCRINE: No polyuria or polydipsia.   LYMPHATICS:  No bleeding or swollen glands.  SKIN:  Positive tenderness of the right big toe with some mild erythema.  MUSCULOSKELETAL: No arthritis.  NEUROLOGY: No CVAs. Positive history of TIAs.   PSYCHIATRIC: Positive history of schizoaffective disorder, bipolar type, and PTSD.   PHYSICAL EXAMINATION: VITAL SIGNS: Temperature 98, pulse is 107, respirations 20, blood pressure 135/73, 98% on room air.  GENERAL: The patient is alert, oriented, not in acute distress.   HEENT: Head is atraumatic. Pupils are round and reactive. Sclera are anicteric. Mucous membranes are moist. Oropharynx is clear.  NECK:  Supple. No JVD, carotid bruit or enlarged thyroid.  CARDIOVASCULAR: Regular rate and rhythm. No murmur, gallops, or rubs. PMI is not displaced.  LUNGS: Clear to auscultation without crackles, rales, rhonchi, or wheezing. Normal to percussion.  ABDOMEN: Bowel sounds are positive, nontender, nondistended. No hepatosplenomegaly.  EXTREMITIES: The patient's right foot is swollen, very tender, positive crepitus, and there is very mild erythema around the big toe.  NEURO: CN 2-13 intact SKIN as above n other rashed BACK no CVAT LABORATORY DATA: White blood cells 6.2, hemoglobin 11, hematocrit 33, platelets are 314,000. Sodium 140, potassium 3.9, chloride  106, bicarbonate 28, BUN 13, creatinine 1.3, glucose of 85, calcium 7.7, bilirubin 0.1, alkaline phosphatase 143, ALT is 23, AST is 21, total protein 6.5, albumin 2.9.   IMAGING: Foot x-ray shows extensive soft tissue gas in the midfoot and toes, more gas than would be expected for focal surgery, and that imaging suggests infection and necrotizing fasciitis not excluded.   ASSESSMENT AND PLAN: A 44 year old male with recent surgery at [facility] 3 weeks ago for an ingrown toenail, who presents with right foot pain, noted to have  probable necrotizing fasciitis on x-ray.  1.  Necrotizing fasciitis. The patient has crepitus on exam plus gas on the x-ray, which is concerning for necrotizing fasciitis.  Dr. Orland Jarred has been called by the ER physician and the plan is for the patient to go to the Operating Room today. The patient is moderate risk for a moderate risk procedure and may go to the Operating Room without any other further cardiac work-up at this time.  The patient is placed on broad-spectrum antibiotics including Zosyn and vancomycin. Blood cultures were ordered in the Emergency Room. We will continue with pain control and antiemetics as tolerated.  2.  History of coronary artery disease and myocardial infarction status post cocaine use. The patient will continue on Coreg and will continue aspirin once okay with Dr. Orland Jarred.  We will also continue Imdur and pravastatin.   3. History schizoaffective disorder, bipolar type. We will continue with his outpatient medications including Seroquel and carbamazepine.   4.  Hypertension. We will continue Coreg and Imdur, monitor blood pressure carefully.  5.  History of diabetes. We will continue Lantus 10 units in the morning, sliding scale insulin, ADA diet.  6.  Gastroesophageal reflux disease. We will continue Protonix.  7.  The patient is a full code status.   TIME SPENT: Approximately 50 minutes.      ____________________________ Janyth Contes. Juliene Pina, MD spm:lr D: 06/11/2014 18:11:42 ET T: 06/11/2014 18:29:01 ET JOB#: [id number]  cc: Sherrilyn Nairn P. Juliene Pina, MD, <Dictator> Steele Sizer, MD Janyth Contes Fidel Caggiano MD ELECTRONICALLY SIGNED 06/11/2014 19:49

## 2015-01-05 NOTE — Consult Note (Signed)
PATIENT NAME:  [name], [name] MR#:  [id number] DATE OF BIRTH:  [birth year]  DATE OF CONSULTATION:  11/30/2013  REFERRING PHYSICIAN:  Chiquita Loth, MD CONSULTING PHYSICIAN:  Ardeen Fillers. Garnetta Buddy, MD  REASON FOR CONSULTATION: Bipolar, acting up, depression with vague suicidal ideations.   HISTORY OF PRESENT ILLNESS: The patient is a 44 year old male with history of bipolar and borderline personality who presented to the ED as he reported that he has been having significant medical problems and he was having suicidal ideations since yesterday evening. He reported that he started thinking about himself and he started getting tired. He started wishing that he should not be here anymore. He stated that he did not attempt to kill himself yesterday. He spoke to his wife and she advised him to come to the hospital. He reported that he was also having "me attitude" with his borderline personality. He reported that he has skipped a few of his medications here and there. However, he is mostly compliant with his medications. However, he reported that he started having thoughts to hurt himself with a plan to overdose on his nitroglycerine pills. He stated that he was having thoughts that he wished he could not be here anymore. Since the thoughts became stronger, he decided to seek help. The patient stated that he is not sure if he will be able to contract for safety at this time. He stated that he has significant medical issues including chest pains as well as history of some bleeding issues and goes to [facility] for hematology and thinks that he might not be doing well medically at this time. The patient stated that he wants help so he can get better with his medications. He stated that he is compliant with his medications, but he feels that the Seroquel is not helping him at this time. He follows at the [facility] with Dr. Carolan Clines as his psychiatrist. However, the last time he saw him was approximately 6 months ago. He wants his  medications to be adjusted at this time.   PAST PSYCHIATRIC HISTORY: The patient reported that he has history of suicide attempts at least 4 times in the past. Most of the time it was related to the overdose on the pills. He reported that he follows at the [facility] and his psychiatrist is Dr. Carolan Clines. He reported that he has been tried on lithium, lamotrigine and Depakote, but he did not do well on those medications. Currently, he feels that he is depressed mentally but physically he is feeling manic. He is unable to contract for safety.   MEDICAL HISTORY: Hypertension, hyperlipidemia, GI bleed, peptic ulcer disease, posttraumatic stress disorder, cardiac arrest x2, type 2 diabetes mellitus, COPD, TIA x2, TTP.   FAMILY HISTORY: The patient reported that he is adopted and does not know of any mental illness in his family.   SOCIAL HISTORY: The patient is currently married for the past 14 years. He does not have any children. He is currently disabled due to PTSD as he was posted in [place] for 2 years. He reported that he has nightmares and flashbacks related to the same. He is taking medications related to the same.   ALLERGIES: DEMEROL, ERYTHROMYCIN, MORPHINE.   CURRENT MEDICATIONS: Quetiapine 400 mg p.o. daily, Protonix 40 mg p.o. daily, promethazine 25 mg every 6 hours p.r.n., pravastatin 20 mg p.o. daily, oxycodone 30 mg 4 times daily, Nucynta ER 200 mg b.i.d., Precedex 500 mg 2 times daily, isosorbide mononitrate 1 tablet twice daily,  ferrous sulfate 325 mg once daily, docusate sodium 100 mg 2 times daily, diphenhydramine 25 mg 4 times daily, Carvedilol 12.5 mg b.i.d., carbamazepine 100 mg once daily, Bactrim DS 800 mg twice daily, aspirin 325 mg once daily.  ANCILLARY DATA: Temperature 96.5, pulse 81, respirations 18, blood pressure 111/65.  LABORATORY DATA: Glucose 81, BUN 14, creatinine 1.54, sodium 132, potassium 3.8, chloride 98, bicarbonate 30, anion gap 4, osmolality  254, calcium 8.6. Blood alcohol less than 3. Protein 8.4, albumin 3.7, bilirubin 0.3, alkaline phosphatase 194, AST 19, ALT 17. Troponin less than 0.02. UDS positive for cocaine and opioids. WBC 8.8, RBC 4.06, hemoglobin 13.1, MCV 101.   MENTAL STATUS EXAMINATION: The patient is a moderately built male who appeared his stated age. He maintained fair eye contact. His speech was low in tone and volume. Mood was depressed and anxious. Affect was congruent. Thought process was tangential. Thought content was nondelusional. He currently denied having any perceptual disturbances. His language was intact. Fund of knowledge appears within normal range. He was unable to contract for safety as he is currently having suicidal ideations with a plan to overdose on pills. He denied having any homicidal ideations. He demonstrated fair insight and judgment by coming to the hospital and seeking help.   DIAGNOSTIC IMPRESSION: AXIS I: Bipolar I disorder, most recent episode mixed, severe, without psychotic features.  AXIS II: Borderline personality traits.  AXIS III: Please review the medical history.   TREATMENT PLAN: 1.  The patient is currently on involuntary commitment and will be admitted to the inpatient behavioral health unit for stabilization and safety.  2.  I will restart him on the medications.  3.  He will be evaluated by the treatment team and his medications will be adjusted. Will obtain collateral information from his psychiatrist at the [facility].  Thank you for allowing me to participate in the care of this patient.   ____________________________ Ardeen FillersUzma S. Garnetta BuddyFaheem, MD usf:sb D: 11/30/2013 13:03:40 ET T: 11/30/2013 13:22:02 ET JOB#: [id number]  cc: Ardeen FillersUzma S. Garnetta BuddyFaheem, MD, <Dictator> Rhunette CroftUZMA S Briony Parveen MD ELECTRONICALLY SIGNED 12/05/2013 13:59

## 2015-01-05 NOTE — Op Note (Signed)
PATIENT NAME:  [name], [name] MR#:  [id number] DATE OF BIRTH:  1970-12-08  DATE OF PROCEDURE:  06/11/2014  PREOPERATIVE DIAGNOSIS: Crepitus in the soft tissues of the right foot with some drainage from the right great toenail area. Crepitus is on the right foot.   POSTOPERATIVE DIAGNOSIS:  Crepitus in the soft tissues of the right foot with some drainage from the right great toenail area. Crepitus is on the right foot.   PROCEDURE:  1.  Incision and drainage of crepitus areas to the soft tissue regions on the dorsum and medial aspect of the right foot.  2.  Incision and drainage of the nail bed area, right great toe.   SURGEON: Epimenio SarinMatthew G Nisa Decaire, DPM   ASSISTANT: None.   HISTORY OF PRESENT ILLNESS: The patient states that he yanked his toenail off several weeks ago. He developed a secondary infection with crepitus that was [place] [facility]. He was followed for that, given antibiotics Levaquin orally, which he took and when he came off it he states he started to get pain and irritation in the foot again. He did not go back to [facility] because he did not have the gas to get over there in his car. He came to the Emergency Room today. X-ray showed what appeared to be gas in the tissues consistent with the areas of crepitus.   ANESTHESIA: General.  BLOOD LOSS: Negligible.   HEMOSTASIS: Ankle tourniquet 350 mmHg pressure.   OPERATIVE REPORT: The patient was brought to the OR and placed on the OR table in the supine position. At this point after general anesthesia was achieved, a local block was achieved at the deep peroneal nerve, the posterior tibial nerve, and the sural nerve, which he tolerated well. At this point, the area was prepped and draped in the usual sterile manner. Attention was directed to the crepitus areas on the dorsum of the right foot, the dorsomedial aspect and also the medial aspect of the foot. These had earlier been marked. It was checked again and crepitus  was palpated. A 3 cm linear incision was made over the dorsal aspect of the foot over one area of crepitus. The incision was deepened with sharp and blunt dissection. Bleeders were clamped and bovied as required. No evidence of infection or purulence was noted in the region. The incision was carried down to the periosteal tissue. There was some clear-type fluid draining from the region consistent with likely lymph fluid. Once again, the area was probed and checked and actually no evidence of infection or gas or necrosis was noted in the region. The tissues all look healthy.  At this time, attention was directed to the medial aspect of the left foot where another area was indicated on x-ray as well as palpation of crepitus in that region. A 2.5 cm incision was made over this region, deepened with sharp and blunt dissection. Bleeders were clamped and bovied as required. Once again, no evidence of crepitus or necrosis was noted.  No evidence of fluid I should say was noted in the region, other than just a clear-type fluid, no pus, no evidence of necrosis or infected tissue was seen. Both these areas were copiously irrigated at this point, then closed with 4-0 nylon horizontal mattress sutures.   At this time, attention was directed to the toenail region where compression did yield some mild purulence from the area. A periosteal elevator was used to scrape tissue buildup off the region and there was  a small short sinus tract going laterally and plantarly away from the nail bed region. This was copiously irrigated and cleaned with the elevator to remove all debris. The area was cultured here. There was also a culture done on the dorsum of the foot earlier. These were sent for evaluation and culture and sensitivity. Once the toenail area and the nail bed was cleaned up, the short sinus area, which was approximately 1 cm at the most of was then packed with a gentamicin saline-soaked gauze, and a sterile compressive  dressing was placed across the wounds consisting of Xeroform gauze, 4 x 4's, Kling and Kerlix. The tourniquet was released and prompt and complete vascularity was seen to return to all digits of the right foot. The patient left the OR for the recovery room with vital signs stable and neurovascular status intact.    ____________________________ Rhona Raider. Anglia Blakley, DPM mgt:LT D: 06/11/2014 20:37:51 ET T: 06/11/2014 21:52:09 ET JOB#: [id number]  cc: Rhona Raider Jeanita Carneiro, DPM, <Dictator> Epimenio Sarin MD ELECTRONICALLY SIGNED 07/03/2014 10:08

## 2015-01-05 NOTE — Consult Note (Signed)
Brief Consult Note: Diagnosis: celluitis with crepitus and gas in tissues right foot.   Patient was seen by consultant.   Recommend to proceed with surgery or procedure.   Discussed with Attending MD.   Comments: Pt is diabetic and has a reccurrence of soft tissue crepitus and gas in tissues.  Seemed fairly extensive to right foot on xray.  Med hx pos. for diabetes and cardiovascular disease.  Electronic Signatures: Epimenio Sarinroxler, Kenzi Bardwell G (MD)  (Signed 28-Sep-15 18:38)  Authored: Brief Consult Note   Last Updated: 28-Sep-15 18:38 by Epimenio Sarinroxler, Lashondra Vaquerano G (MD)
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
## 2015-01-05 NOTE — H&P (Signed)
PATIENT NAME:  [name], [name] MR#:  [id number] DATE OF BIRTH:  11-24-1970  DATE OF ADMISSION:  06/11/2014  ADDENDUM   TOBACCO DEPENDENCE:  The patient smokes 1 pack a day.  He is not interested in quitting at this time. He does want a nicotine patch. The patient was counseled for 3-1/2 minutes.     ____________________________ Janyth ContesSital P. Juliene PinaMody, MD spm:lr D: 06/11/2014 18:14:51 ET T: 06/11/2014 19:15:44 ET JOB#: [id number]  cc: Eliya Geiman P. Juliene PinaMody, MD, <Dictator> Janyth ContesSITAL P Calais Svehla MD ELECTRONICALLY SIGNED 06/11/2014 19:49

## 2015-01-05 NOTE — Consult Note (Signed)
PATIENT NAME:  [name], [name] MR#:  [id number] DATE OF BIRTH:  Jan 29, 1971  DATE OF CONSULTATION:  12/20/2013  CONSULTING PHYSICIAN:  Audery Amel, MD  IDENTIFYING INFORMATION AND REASON FOR CONSULTATION: A 44 year old man with history of bipolar disorder and substance abuse presented to the hospital stating he does not deserve to be here. Consultation for appropriate psychiatric disposition.   HISTORY OF PRESENT ILLNESS: Information obtained from the patient and the chart. The patient came to the Emergency Room last night stating at that time that he did not deserve to live and that he was feeling very angry and sad about things that he has done. Today, he has calmed down quite a bit. Says he was having a bad day yesterday. He has been feeling very stressed out about major problems in his life. When he was using cocaine, he cheated on his wife and did some other irresponsible things and now his wife is threatening to leave him. He was feeling overwhelmed about it and it appears that he was drinking probably yesterday.  On evaluation today, the patient tells me that he is feeling better. He says his mood stays depressed and anxious but that now he has calmed down. He denies any suicidal ideation. Denies any psychotic symptoms. Says that he feels like he has a better grip on things and is capable of taking care of himself and following up with his outpatient psychiatrist. He is not having any psychotic symptoms right now. He says he has been off of cocaine for 3 weeks, but has been in the meantime using marijuana and yesterday was drinking and presented here with a blood alcohol level of 168. He has been compliant with his medication for his diagnosis of bipolar disorder and PTSD. Sleep remains somewhat poor. He also has chronic pain and other medical problems.   PAST PSYCHIATRIC HISTORY: The patient has a history of mood instability, has had suicide attempts in the past, has had psychiatric  hospitalizations in the past, history of polysubstance dependence with cocaine being the biggest problem, especially most recently. He is followed at the [facility] in [place] for his mental health and psychiatric problems. Currently his psychiatric medicine is Seroquel 400 mg at night, Tegretol 100 mg at night, Wellbutrin XL 150 mg per day, which he says has been helpful for him.   PAST MEDICAL HISTORY: The patient has chronic pain from multiple orthopedic injuries, a history of myocardial infarction, a history of stroke, coronary artery disease, gastric reflux disease, dyslipidemia.   CURRENT MEDICATIONS: Nucynta 200 mg twice a day, Protonix 40 mg once a day, quetiapine 400 mg at night, aspirin 325 mg a day, insulin daily for diabetes, carbamazepine 100 mg in the morning, isosorbide 60 mg twice a day, cetirizine 10 mg once a day, bupropion XL 150 mg once a day, docusate 100 mg twice a day, oxycodone 30 mg 4 times a day as needed, pravastatin 20 mg once a day, carvedilol 12.5 mg twice a day and iron supplements.   ALLERGIES: DEMEROL, ERYTHROMYCIN AND MORPHINE.   SOCIAL HISTORY: The patient is on disability, lives with his wife. He and his wife seem to possibly be in the process of splitting up.   REVIEW OF SYSTEMS:  Currently says that he is still feeling depressed, but is not hopeless and denies suicidal or homicidal ideation and denies any hallucinations. Feels capable of taking care of himself at home. Has chronic pain, especially in his legs, also in his back. Feeling sweaty and  sick to his stomach right now. No other acute physical symptoms. The rest of review of systems is negative.   MENTAL STATUS EXAMINATION: Reasonably well-groomed man who looks his stated age, cooperative with the interview. Eye contact good. Psychomotor activity calm. Speech normal rate, tone and volume. Affect mildly anxious. Mood stated as okay. Thoughts are lucid without loosening of associations. No evidence of delusions.  Denies auditory or visual hallucinations. Denies suicidal or homicidal ideation. Judgment and insight improved. Normal fund of knowledge. Short and long-term memory grossly intact. Alert and oriented x 4.   LABORATORY RESULTS: Chemistry panel: Low sodium 135. Calcium low 8.2, alcohol at 12:00 last night 168. Drug screen positive for MDMA, cannabis. Urine analysis unremarkable.   ASSESSMENT: A 44 year old man with a history of bipolar disorder, posttraumatic stress disorder and substance abuse presented intoxicated making vaguely suicidal statements, had not tried to harm himself. Today, he has sobered up. Not showing any signs of alcohol withdrawal. Affect is improved. His thoughts are clear and he denies suicidal ideation. He has appropriate treatment outside the hospital. He is not on involuntary commitment and does not meet commitment criteria.   TREATMENT PLAN: Supportive and educational therapy and review of plan. The patient can be released from the Emergency Room and will follow up [facility] [place]. No new medicines prescribed. He has not had his narcotics all day and I have checked to make sure that he is legitimately prescribed some, so we will give him 1 dose of hydrocodone before he leaves here.   DIAGNOSIS, PRINCIPAL AND PRIMARY:  AXIS I:   Bipolar disorder, not otherwise specified.   SECONDARY DIAGNOSES: AXIS I:   Posttraumatic stress disorder.               Cocaine dependence.                Alcohol intoxication. AXIS II: Deferred.  AXIS III: Chronic pain, diabetes, high blood pressure, dyslipidemia.  AXIS IV: Severe from major social stress.  AXIS V:  Functioning at time of evaluation is 55.    ____________________________ Audery AmelJohn T. Lavarius Doughten, MD jtc:ce D: 12/20/2013 18:01:28 ET T: 12/20/2013 18:23:05 ET JOB#: [id number]  cc: Audery AmelJohn T. Ottis Sarnowski, MD, <Dictator> Audery AmelJOHN T Brieann Osinski MD ELECTRONICALLY SIGNED 12/20/2013 23:46
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
## 2015-01-05 NOTE — Discharge Summary (Signed)
PATIENT NAME:  [name], Ernest G MR#:  [id number] DATE OF BIRTH:  [birth year]  DATE OF ADMISSION:  06/11/2014 DATE OF DISCHARGE:  06/13/2014  PRIMARY CARE PHYSICIAN: Steele SizerMark A. Crissman, MD  DISCHARGE DIAGNOSES:  1.  Right foot infection and crepitus in the soft tissue of the right foot with some drainage from the right great toenail area.  2.  Diabetes.  3.  Coronary artery disease.   CONDITION: Stable.   CODE STATUS: Full code.   HOME MEDICATIONS: Please refer to the medication reconciliation list.   DIET: Low-sodium, low-fat, low-cholesterol, ADA diet.   ACTIVITY: As tolerated.   FOLLOWUP CARE: Follow with PCP and Dr. Orland Jarredroxler within 1-2 weeks.   REASON FOR ADMISSION: Pain in the foot.  HOSPITAL COURSE: The patient is a 44 year old Caucasian male with a history of coronary artery disease, hypertension, and diabetes, who presented to the ED with right foot pain for 1 week, with increasing pain, redness, swelling and tenderness on the right toe. The patient also has a fever, chills, and joint pain. X-ray showed gas and concern for necrotizing fasciitis. Dr. Orland Jarredroxler did a right foot surgery on the admission evening.   For detailed history and physical examination, please refer to the admission note dictated by Dr. Juliene PinaMody.  LABORATORY DATA: On admission date showed WBC 6.2, hemoglobin 11, BUN 13, creatinine 1.3, and electrolytes were normal.   ASSESSMENT AND PLAN:  1.  Right foot infection. During the operation, the patient was found to have crepitus in the soft tissue of the right foot with drainage from the right great toenail area, and also crepitus is on the right foot. Dr. Orland Jarredroxler did an I and D on the admission date. After the admission, the patient has been treated with vancomycin and Zosyn. Blood culture so far is negative. Wound culture was sent. The patient has been treated with Dilaudid, and Percocet for pain control, but the patient still complains of right foot pain. Today,  right foot pain is better. The patient is clinically stable. Dr. Orland Jarredroxler suggested that the patient may be discharged to home today.  2.  For coronary artery disease, the patient has been treated with aspirin, Coreg, Imdur, and pravastatin.  3.  Diabetes. The patient has been treated with Levemir and sliding scale. Blood sugar is controlled.   The patient is clinically stable. He will be discharged to home today. I discussed the  patient's discharge plan with the patient, nurse, and case manager.   TIME SPENT: About 36 minutes.    ____________________________ Shaune PollackQing Reisha Wos, MD qc:MT D: 06/13/2014 10:37:30 ET T: 06/13/2014 13:49:25 ET JOB#: [id number]  cc: Shaune PollackQing Korah Hufstedler, MD, <Dictator> Shaune PollackQING Lenzie Sandler MD ELECTRONICALLY SIGNED 06/14/2014 12:33
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
## 2015-01-06 NOTE — Consult Note (Signed)
Brief Consult Note: Diagnosis: Right medial thigh boil, surrounding cellulitis.   Patient was seen by consultant.   Consult note dictated.   Recommend further assessment or treatment.   Orders entered.   Discussed with Attending MD.   Comments: *Right medial thigh furuncle/abscess with mild surrounding cellulitis - pt has h/o recurrent MRSA soft tissue skin infections.  he is currently failing Bactrim (has been on it for > 1 wk and states the lesion has expanded).  should've responded to bactrim if truly MRSA infection.  will stop bactrim, start clindamycin, if he gets [facility]'d [facility] recommend prompt f/u with either a medical or surgical physician within 1 wk however at this time do not feel like surgical drainage (I&D) is currently indicated.  advised pt to apply warm compress on the lesion as well.  will see pt again in am and if no improvement would consider obtaining surgical eval for consideration of I&D. another option would be augmentin (though it wouldn't cover MRSA) however pt has PCN listed as allergy though he tells me he's not allergic to it.  pt is hemodynamically stable and non-toxic appearing.  for now will d/c bactrim and prescribe a 7 day course of clindamycin and monitor clinical response.  *htn - bp well-controlled, cont coreg and imdur  *hyperlip - cont statin  *DM  - cont metformin  *CAD - stable, cont ASA, BB, nitrate, statin  *cocaine abuse - pt counceled to quit, denies IVDU  *tob abuse - pt strongly counceled on importance of smoking cessation (approx 3 min. spent in doing so), continue nicotrol inhaler as per pt's desire   *BPD - being managed by Dr. Jennet MaduroPucilowska  thanks for the consult and for involving me in Mr. Milas GainCraddock's care, pt currently medically stable and there are no indications for transfer to medical service at this time.  Electronic Signatures: Camillo FlamingLateef, Kandise Riehle (MD)  (Signed 11-Apr-13 14:02)  Authored: Brief Consult  Note   Last Updated: 11-Apr-13 14:02 by Camillo FlamingLateef, Katlin Bortner (MD)

## 2015-01-06 NOTE — H&P (Signed)
PATIENT NAME:  [name], [name] MR#:  [id number] DATE OF BIRTH:  05-Sep-1971  DATE OF ADMISSION:  12/16/2011  ADMITTING PHYSICIAN: Enid Baas, MD  PRIMARY CARE PHYSICIAN: Located in [place].  CHIEF COMPLAINT: Right-sided numbness and weakness.   HISTORY OF PRESENT ILLNESS: [name] is a 44 year old male with multiple medical problems including depression, bipolar disorder, posttraumatic stress disorder, chronic pain syndrome, hypertension, history of MI secondary to microthrombi from TTP, idiopathic thrombotic thrombocytopenic purpura requiting plasma exchange therapy in April 2012 at [facility], polysubstance abuse, and ongoing smoking who presents to the hospital complaining of new onset right-sided numbness and also weakness that started this a.m. The patient states that he was fine up until last night when he woke up with numbness of the entire right side of his body and had slightly decreased strength on the right side. He denies any visual changes, speech or swallowing problems, or gait abnormalities. He had a prior diagnosed TIA and was admitted to  [facility] for similar right-sided weakness at that time and recovered function. He is only taking aspirin currently for stroke prophylaxis.   PAST MEDICAL HISTORY:  1. Hypertension.  2. History of peptic ulcer disease and upper gastrointestinal bleed.  3. Gastroesophageal reflux disease.  4. Idiopathic thrombotic thrombocytopenic purpura. First episode in April 2012 requiring plasma exchange therapy, and he was admitted to [facility] at that time. No further recurrence afterwards.  5. History of MRSA skin and soft tissue infections.  6. Hyperlipidemia.  7. History of transient ischemic attack with no residual neurological complaints at this time.  8. Chronic knee pain bilaterally.  9. Post traumatic stress disorder. 10. Depression.  11. Bipolar disorder.  12. Chronic obstructive pulmonary disease.  PAST SURGICAL HISTORY:  1. Left wrist  surgery.  2. Bilateral knee arthroscopic surgery.  ALLERGIES TO MEDICATIONS: Demerol, erythromycin, and penicillin.  CURRENT HOME MEDICATIONS:  1. Aspirin 81 mg p.o. daily.  2. Carbamazepine 200 mg p.o. bod. 3. Coreg 6.25 mg p.o. twice a day. 4. Dilaudid 4 mg p.o. every 6 hours.  5. Gabapentin 600 mg p.o. three times daily. 6. Haldol 2.5 mg p.o. three times daily. 7. Imdur 30 mg p.o. twice a day. 8. Metformin 500 mg p.o. twice a day. 9. Morphine sulfate long-acting 60 mg p.o. twice a day. 10. Nitroglycerin 0.4 mg sublingual every five minutes p.r.n. for chest pain.  11. Pravachol 10 mg p.o. daily.  12. ProAir inhaler every six hours as needed.  13. Seroquel 200 mg p.o. at bedtime.  14. Trazodone 100 mg p.o. at bedtime.  15. Vitamin D3 2000 international unit tablets p.o. daily.   SOCIAL HISTORY: He lives at home with his wife. He continues to smoke about 1 pack per day. Prior history of alcohol use, none currently. Used to smoke cocaine in the past but says he has been clean for the last eight months, except for a few episodes of relapse around Christmas time.   FAMILY HISTORY: He is adopted and does not know anything about his biological parents.   REVIEW OF SYSTEMS: CONSTITUTIONAL: No fever, fatigue, or weakness. EYES: No blurred vision, double vision, pain, redness, inflammation, or glaucoma. ENT: No tinnitus. He has 20% hearing loss in the right ear. No epistaxis, discharge, or snoring. RESPIRATORY: No cough, wheeze, or hemoptysis. CARDIOVASCULAR: Positive for musculoskeletal chest pain. No orthopnea, edema, arrhythmia, dyspnea exertion, palpitations, or syncope. GI: Positive for nausea. No vomiting, diarrhea, abdominal pain, hematemesis, or melena. GENITOURINARY: No dysuria, hematuria, renal calculus, frequency, or incontinence.  ENDOCRINE: No polyuria, nocturia, thyroid problems, heat or cold intolerance. HEMATOLOGY: History of thrombocytopenia. No anemia or easy bruising. SKIN:  Multiple soft tissue chronic lesions seen on both lower extremities with some redness. MUSCULOSKELETAL: Chronic knee pain and also back pain. No gout. NEUROLOGIC: History of transient ischemic attack and also right-sided weakness and numbness. No CVA or seizures. PSYCHOLOGICAL: History of depression and bipolar. Not actively depressed currently.   PHYSICAL EXAMINATION:   VITAL SIGNS: Temperature 98.3 degrees Fahrenheit, pulse 92, respirations 18, blood pressure 121/73, and pulse oximetry 94% on room air.   GENERAL: Well-built, well-nourished male sitting in bed, not in any acute distress.   HEENT: Normocephalic, atraumatic. Pupils equal, round, and reacting to light. Anicteric sclerae. Extraocular movements intact. Oropharynx clear without erythema, mass or exudates.   NECK: Supple. No thyromegaly, JVD, or carotid bruits. No lymphadenopathy.   LUNGS: Moving air bilaterally. Decreased bibasilar breath sounds. No use of accessory muscles for breathing. No wheeze or crackles.   CARDIOVASCULAR: S1 and S2 regular rate and rhythm. No murmurs, rubs, or gallops.   ABDOMEN: Soft, nontender, and nondistended. No hepatosplenomegaly. Normal bowel sounds.   EXTREMITIES: No pedal edema. No clubbing or cyanosis. 2+ dorsalis pedis pulses palpable bilaterally.   SKIN: He does have soft tissue, firm nodules with some mild erythema on top of them, on both lower extremities, about two to three on each side. Not fluctuating or fluid-filled, on my examination. The patient had a self-puncture a couple of times trying to drain them and can see the healing spots on top of them.   NEUROLOGIC: Cranial nerves intact. He does have 4/5 strength in the right upper and also lower extremities, but his reflexes are normal. Negative Babinski.  PSYCH: He is awake, alert, and oriented x3.   LABS/STUDIES: WBC 6.8, hemoglobin 11.5, hematocrit 34.3, and platelet count 344.   Sodium 137, potassium 4.0, chloride 102, bicarbonate  29, BUN 11, creatinine 0.8, glucose 125, and calcium 8.3.   ALT 10, AST 14, alkaline phosphatase 122, total bilirubin 0.2, and albumin 3.1. Troponin negative. INR 1.0. CK and CK-MB within normal limits.   Urine tox screen positive for opiates and also barbiturates.   Chest x-ray: No acute cardiopulmonary disease, clear lung fields. No effusion or evidence of pneumothorax, congestive heart failure, or pneumonia.   CT of the head without contrast: No intracranial hemorrhage or mass affect. Normal size of the ventricles. Cerebellum and brain stem are normal in density. Normal CT scan.   EKG: Normal sinus rhythm with heart rate of 92.   ASSESSMENT AND PLAN: This is a 44 year old male with multiple medical problems including bipolar, depression, posttraumatic stress disorder, idiopathic TTP [facility] at [facility] in April 2012, and history of myocardial infarction and transient ischemic attack who presents with right-sided numbness.  1. Right-sided numbness with 4/5 strength on exam and sensory deficits. Could be either transient ischemic attack versus cervical radiculopathy. CT of the head is negative. We will admit to telemetry under observation with his high risk. We will get a MRI of the brain, carotid Dopplers, and also echocardiogram. We will continue aspirin for now. We will avoid Plavix with his history of TTP because Plavix can trigger that again. He denies using any cocaine recently and urine tox screen is negative at this time, because cocaine can also cause these symptoms. We will get physical therapy consultation, check lipid profile, and continue Pravachol for now.  2. Coronary artery disease, appears stable for now. He has chronic muscular chest  pain and he is on nitroglycerin p.r.n. Continue to check troponin x3 and continue medical management with aspirin, Coreg, Imdur, and statin.  3. History of idiopathic TTP. It appears stable for now. Follow-up platelet counts while on aspirin.  4. Chronic  pain syndrome. Continue home medications of morphine long-acting and Dilaudid p.o. every six hours. He is also on Gabapentin. No further narcotics with his history of drug use.  5. Depression and bipolar disorder and also posttraumatic stress disorder. He had recent admission to the Behavioral Medicine unit for major depression with suicidal ideation and was discharged in January 2013. Currently he denies any active suicidal thoughts. He is on trazodone and carbamazepine, which we will continue.  6. Multiple skin abscesses. We will give one dose of vancomycin and continue outpatient Bactrim that was started by his primary care physician recently. 7. Gastroesophageal reflux disease and peptic ulcer disease. He is on Protonix now.  8. Tobacco use disorder. He was strongly counseled against smoking for three minutes and he is placed on a Nicotrol inhaler in the hospital.   CODE STATUS: FULL CODE.   TIME SPENT ON ADMISSION: 50 minutes.  ____________________________ Enid Baas, MD rk:slb D: 12/16/2011 11:02:47 ET T: 12/16/2011 12:17:37 ET JOB#: [id number]  cc: Enid Baas, MD, <Dictator> Enid Baas MD ELECTRONICALLY SIGNED 12/18/2011 13:48

## 2015-01-06 NOTE — Discharge Summary (Signed)
PATIENT NAME:  [name], [name] MR#:  [id number] DATE OF BIRTH:  [birth year]  DATE OF ADMISSION:  09/16/2011 DATE OF DISCHARGE:  09/21/2011  HISTORY OF PRESENT ILLNESS: [name] was admitted to the inpatient Behavioral Medicine Unit on the 2nd of January 2013 due to a plan to shoot himself with a gun and cocaine relapse. He was demonstrating depressed mood, low energy, poor concentration, insomnia, thoughts of hopelessness as well as suicidal thoughts.   HOSPITAL COURSE: [name] was admitted to the inpatient Behavioral Medicine Unit and underwent milieu and group psychotherapy.   There was likelihood that he had been noncompliant with his psychotropic medications and he was restarted on his Tegretol 200 mg twice a day, Lamictal 100 mg b.i.d. Seroquel 200 mg at bedtime, (Tegretol as his main mood stabilizer, Seroquel for anti-psychosis and augmenting Tegretol, Lamictal for augmenting Tegretol).   HOSPITAL COURSE: Over the hospital course [name] improved with his mood. He developed hope as well as interests and his energy returned to normal. He was not demonstrating any adverse side effects.   CONDITION ON DISCHARGE: By January 7th [name] has normal energy as well as moody. His interests are normal. He has intact hope. He is not having any thoughts of harming himself or others. He has no delusions or hallucinations. His sleep is normal. He is not experiencing any adverse medication effects.   MENTAL STATUS EXAM UPON DISCHARGE: [name] is alert. His eye contact is good. His concentration is normal. Affect is broad and appropriate. Mood is within normal limits. He is oriented to all spheres. Memory is intact to immediate, recent, and remote. Thought process logical, coherent, goal directed. No looseness of associations or tangents. Thought content: No thoughts of harming himself or others. No delusions or hallucinations. Insight is intact. Judgment is intact. Affect broad  and appropriate. Mood within normal limits.   ASSESSMENT:  AXIS I:  1. Bipolar disorder, not otherwise specified. 2. Cocaine abuse.   AXIS II: Deferred.   AXIS III: History of TTP. Please see the past medical history.   AXIS IV: Primary support group, general medical.   AXIS V: 55.   DISPOSITION: [name] is not at risk to harm himself or others. He agrees to call emergency services immediately for any thoughts of harming himself, thoughts of harming others, or distress.   [name] agrees to not drive if drowsy.   DISCHARGE MEDICATIONS:  1. Aspirin 81 mg daily.  2. Carbamazepine 200 mg b.i.d.  3. Coreg 6.25 mg b.i.d.  4. Vitamin D3 2000 units daily.  5. Colace 100 mg daily p.r.n. constipation. 6. Dilaudid 4 mg q.6 hours p.r.n. breakthrough pain, dispense #12. 7. Imdur 30 mg daily.  8. Lamotrigine 100 mg b.i.d.  9. Morphine ER 30 mg 1 b.i.d., dispense #6.  10. Pantoprazole 40 mg twice a day.  11. Seroquel 200 mg at bedtime. 12. Glucophage 500 mg twice a day.   DIET: Regular.   ACTIVITY: Routine.   FOLLOW-UP:  1. Psychiatric follow-up at [facility] on January, 11, 2013 at 1:30 p.m.  2. Dr. Lamar [facility]Hertzberg at the [facility] on November 06, 2011 at 1:00 p.m.  3. [facility].    ____________________________ Adelene AmasJames S. Christen Wardrop, MD [id number]:rbg D: 09/21/2011 19:15:42 ET T: 09/22/2011 16:22:17 ET JOB#: [id number]  cc: Adelene AmasJames S. Aishani Kalis, MD, <Dictator> Lester CarolinaJAMES S Lorana Maffeo MD ELECTRONICALLY SIGNED 10/08/2011 11:51

## 2015-01-06 NOTE — H&P (Signed)
PATIENT NAME:  [name], [name] MR#:  [id number] DATE OF BIRTH:  1970-09-26  DATE OF ADMISSION:  09/16/2011  CHIEF COMPLAINT: "Suicidal."  HISTORY OF PRESENT ILLNESS: [name] has been stating that he has a plan to shoot himself with a gun. He has relapsed on cocaine recently. He did show insight and motivation in asking the police to bring him to the Emergency Room. He gives a history of over a week of depressed mood, low energy, poor concentration, insomnia and hopelessness, as well as suicidal thoughts, and a plan to shoot himself.   He has not been taking his medication.   [name] does have intact orientation and memory function. His thought process is coherent. He is not having any thoughts of harming others. He has no hallucinations.   PAST PSYCHIATRIC HISTORY: He has acquired diagnoses of substance abuse as well as posttraumatic stress disorder. He has undergone psychiatric hospitalizations at [facility] as well as the [place] in [place].   FAMILY PSYCHIATRIC HISTORY: None known.   SOCIAL HISTORY: Please see the above. [name] has relapsed on cocaine and has been using approximately three thousand dollars worth in the past week. He does have a history of alcohol abuse. He has never been incarcerated or arrested. He has a very supportive wife.   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   PAST MEDICAL HISTORY:  1. History of left hand surgery. 2. Gastroesophageal reflux disease.  3. Thrombotic thrombocytopenic purpura. 4. Methicillin Resistant Staphylococcus Aureus within the past six months. 5. History of bleeding ulcer. 6. Hypercholesterolemia. 7. Hypertension. 8. Chronic knee pain bilaterally. 9. History of pancreatitis. 10. History of gastrointestinal bleed.  11. 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   ALLERGIES: He is allergic to Demerol  and penicillin.   MEDICATIONS:  1. Colace 100 mg daily. 2. Dilaudid 4 milligrams q.6 hours p.r.n.  3. Pantoprazole 40 mg twice a day.  4. Lamotrigine 100 mg b.i.d.  5. Ascriptin enteric 81 mg once a day. 6. ProAir HFA one puff four times a day p.r.n.  7. Coreg 6.25 mg twice a day and/or 30 mg extended release once a day. 8. Clonazepam 1 mg q.a.m. and 2 mg at bedtime. 9. [name] states that he was on morphine extended-release 100 mg 3 times daily. He does acknowledge that he has been off of it for three weeks. He had severe pain.  10. Multivitamin daily.  11. Nitroglycerin 0.4, one tab sublingual every five minutes as needed. 12. Seroquel 200 mg at bedtime. 13. Pravastatin 200 mg daily. 14. Vitamin D3 2,000 units daily.   LABORATORY DATA: His QTc was 474 ms on 01/01 with an EKG. He had a PA and lateral chest x-ray which revealed clear lung fields, normal heart size, bilateral hyperinflation suggesting a history of asthma. Drug screen was positive for cocaine. Thyroid stimulating hormone was decreased at 0.19. WBC 7.9, hemoglobin 13.4, platelet count 345, SGPT 10, SGOT 14, BUN 6, creatinine 1.07 Tylenol unremarkable.   REVIEW OF SYSTEMS: Constitutional, head, eyes, ears, nose, throat, mouth, neurologic, psychiatric, cardiovascular, respiratory, gastrointestinal, genitourinary, skin, musculoskeletal, hematologic, lymphatic, endocrine, metabolic all unremarkable.   PHYSICAL EXAMINATION:  VITAL SIGNS: Temperature 97.1, pulse 89, respiratory rate 20, blood pressure 127/79.   GENERAL APPEARANCE: [name] is a well-developed, well-nourished young male sitting up on his hospital bed with no abnormal involuntary movements. He does not have  cachexia. He has normal muscle tone. He is well groomed with normal hygiene.   [name] is alert. His eye contact is good. He is oriented to all spheres. Concentration mildly decreased. Fund of knowledge, intelligence, and use of language are normal.  Speech involves normal rate and prosody without dysarthria. Thought process is logical, coherent, goal directed. No looseness of associations. No tangents. Thought content: Suicidal ideation is present; however, it is passive and he agrees with contract for no harm in the hospital. He has no delusions or hallucinations. Affect is constricted. Mood is depressed. Insight is partial. Judgment is intact for the need of psychiatric care.   HEENT: Oropharynx clear without erythema. Hearing intact bilaterally.   NECK: Supple. No masses.   RESPIRATORY: Clear valve sounds. No wheezing, rhonchi, or crackles.   CARDIOVASCULAR: Regular rate and rhythm. No murmurs, rubs, or gallops.   ABDOMEN: Bowel sounds positive, nontender. No masses.   GENITOURINARY: Deferred.   LYMPHATICS: No palpable nodes.   EXTREMITIES: No cyanosis, clubbing, or edema.   SKIN: Normal turgor. No rashes.   NEUROLOGIC: Cranial nerves II through XII intact. General sensory intact throughout to light touch. Motor 5/5 strength throughout. Coordination intact bilaterally with finger-to-nose testing. Deep tendon reflexes normal strength and symmetry throughout. No Babinski.   ASSESSMENT:  AXIS I:  1. Bipolar disorder, not otherwise specified.  2. Posttraumatic stress disorder by record.  3. Cocaine dependence.   AXIS II: Deferred.   AXIS III: History of thrombotic thrombocytopenic purpura. Please see the past medical history.   AXIS IV: General medical.   AXIS V: 30.   [name] is at risk for suicide outside of the supportive environment of a hospital.   PLAN: Therefore, will admit [name] to the inpatient Behavioral Medicine unit for further evaluation and treatment. He will be continued on his regular general medication regimen. Will continue his general medication as described above. The undersigned explained to [name] that it is acknowledged that he has sources of chronic pain with severe bilateral knee  injury. However, the morphine sulfate restarted to not exceed 20 mg b.i.d. He agrees to this. Given the extended release preparations, will start the morphine sulfate at 50 milligrams ER b.i.d. with Dilaudid 4 mg one q.6 hours p.r.n. breakthrough pain. Will continue his Seroquel 200 mg at bedtime. As augmentation and mood stabilization, will continue his primary mood stabilizer, lamotrigine 100 mg b.i.d.   Given his noncompliance, will observe for elimination of symptoms now that his regimen has been restarted. However, dosages and medications can be adjusted accordingly.   He will undergo milieu and group psychotherapy.   ____________________________ Adelene AmasJames S. Manolito Jurewicz, MD jsw:ap D: 09/16/2011 20:21:37 ET T: 09/17/2011 07:41:21 ET JOB#: [id number]  cc: Adelene AmasJames S. Jeslyn Amsler, MD, <Dictator> Lester CarolinaJAMES S Clovis Mankins MD ELECTRONICALLY SIGNED 09/20/2011 21:56

## 2015-01-06 NOTE — Discharge Summary (Signed)
PATIENT NAME:  [name], [name] MR#:  [id number] DATE OF BIRTH:  1971-01-11  DATE OF ADMISSION:  12/16/2011 DATE OF DISCHARGE:  12/17/2011  ADMITTING PHYSICIAN: Dr. Enid Baxter.  DISCHARGING PHYSICIAN: Dr. Enid Baxter.    CONSULTATION IN THE HOSPITAL: None.   PRIMARY CARE PHYSICIAN:  [place].   DISCHARGE DIAGNOSES:  1. Right-sided numbness and weakness, likely functional. Magnetic resonance imaging of brain negative.  2. Hypertension.  3. Peptic ulcer disease and history of upper gastrointestinal bleed.  4. Gastroesophageal reflux disease.  5. History of idiopathic thrombocytopenic purpura in 12/2010 requiring plasma exchange therapy at [facility]. No further recurrence of episodes. Currently platelets are stable.  6. History of multiple Methicillin Resistant Staphylococcus Aureus skin and soft tissue infections.  7. Hyperlipidemia.  8. History of transient ischemic attack with no residual neurological complaint in July at [facility].  9. Chronic knee pain bilaterally.  10. Posttraumatic stress disorder.  11. Depression. 12. Bipolar disorder.  13. Chronic obstructive pulmonary disease.  14. History of coronary artery disease secondary to microthrombi causing ST segment elevation myocardial infarction during TTP episode at [facility].   DISCHARGE MEDICATIONS:  1. Aspirin 81 mg p.o. daily.  2. Carbamazepine 200 mg p.o. b.i.d.  3. Coreg 6.25 mg p.o. b.i.d.  4. Dilaudid 4 mg p.o. every six hours p.r.n.  5. Gabapentin 600 mg p.o. t.i.d.  6. Haldol 2.5 mg p.o. 3 times daily.  7. Imdur 30 mg p.o. b.i.d.  8. Metformin 500 mg p.o. b.i.d.  9. Morphine sulfate long-acting 60 mg p.o. b.i.d.  10. Nitroglycerin sublingual tablet 0.4 mg p.r.n. for chest pain.  11. Pravachol 10 mg p.o. daily.  12. ProAir inhaler every six hours as needed.  13. Seroquel 200 mg p.o. at bedtime.  14. Trazodone 100 mg p.o. at bedtime.  15. Vitamin D3 2000  international units p.o. daily.  16. Bactrim double strength 1 tablet p.o. b.i.d.   DISCHARGE DIET: Low sodium, ADA diet.   DISCHARGE ACTIVITY: As tolerated.    FOLLOWUP INSTRUCTIONS: Follow up with primary care physician in 1 to 2 weeks.   LABS AND IMAGING STUDIES: WBC 6.7, hemoglobin 10.4, hematocrit 31.4, platelet count 319. Sodium 140, potassium 4.0, chloride 105, bicarbonate 25, BUN 10, creatinine 0.94, glucose 129, calcium 8.3. Magnesium 1.7. HbA1c 6.4. LDL 48, HDL 37, total cholesterol 045, triglycerides 91. Cardiac enzymes have remained negative. Urine tox screen positive for opiates and also barbiturates. Urinalysis negative for any infection. CT of the head without contrast showing normal noncontrast CT. Chest x-ray showing no acute cardiopulmonary disease. Ultrasound carotid Doppler showing no given nine and in good significant stenosis. Echo Doppler showing no atherosclerotic disease. No hemodynamically significant stenosis. Echo Doppler showing no source of transient ischemic attack or cerebrovascular accident found. Normal LV systolic function. Ejection fraction greater than 55%. Magnetic resonance imaging of the brain without contrast showing no evidence of any focal or acute abnormalities.   BRIEF HOSPITAL COURSE: [name] is a 44 year old male with past medical history significant for history of idiopathic TTP in April 2012 treated at [facility] with plasma exchange therapy, chronic pain syndrome, PTSD, bipolar and depression requiring psychiatric hospitalization for major depression and suicidal tendencies in 09/2011 who presented to the hospital complaining of sudden onset of right-sided numbness and weakness.  Right-sided numbness and weakness. Initially admitted for transient ischemic attack because of prior risk factors as mentioned above, however, was noticed to be subconsciously using his right upper and lower extremities  when not testing strength, For example, he was pulling his IV  pole with his right hand, eating, walking to the bathroom without any unsteady gait and worked well with physical therapy; but when he subsequently tried to test his strength, then he is exhibiting 4- 5 weakness, so possible conversion disorder, not sure what the secondary gain is. He had whole stroke work-up done which was negative, normal MRI, carotid Doppler's and echo. He is being discharged home.   He does have soft tissue infections, no fluctuating abscesses, especially seen on both lower extremities. He was started on Bactrim as an outpatient by PCP. We will continue that. He did get one dose of vancomycin in the hospital. All his other home medications are being continued. His course has been otherwise uneventful. None of his pain medications are being refilled here and he can follow up with his primary care physician.   DISCHARGE CONDITION: Stable.   DISCHARGE DISPOSITION: Home.   TIME SPENT ON DISCHARGE: 45 minutes.    ____________________________ Ernest Baasadhika Tijuana Scheidegger, MD rk:ap D: 12/18/2011 13:38:05 ET T: 12/19/2011 13:43:08 ET JOB#: [id number]  cc: Ernest Baasadhika Benjy Kana, MD, <Dictator> Ernest BaasADHIKA Ernest Rodger MD ELECTRONICALLY SIGNED 12/23/2011 13:43

## 2015-03-27 ENCOUNTER — Emergency Department: Payer: Medicare Other

## 2015-03-27 ENCOUNTER — Emergency Department
Admission: EM | Admit: 2015-03-27 | Discharge: 2015-03-28 | Disposition: A | Discharge status: Home / Self Care | Payer: Medicare Other | Attending: Emergency Medicine | Admitting: Emergency Medicine

## 2015-03-27 DIAGNOSIS — R109 Unspecified abdominal pain: Secondary | ICD-10-CM

## 2015-03-27 DIAGNOSIS — R079 Chest pain, unspecified: Secondary | ICD-10-CM | POA: Insufficient documentation

## 2015-03-27 DIAGNOSIS — I10 Essential (primary) hypertension: Secondary | ICD-10-CM | POA: Diagnosis not present

## 2015-03-27 DIAGNOSIS — Z720 Tobacco use: Secondary | ICD-10-CM | POA: Diagnosis not present

## 2015-03-27 DIAGNOSIS — E119 Type 2 diabetes mellitus without complications: Secondary | ICD-10-CM | POA: Insufficient documentation

## 2015-03-27 DIAGNOSIS — R1013 Epigastric pain: Secondary | ICD-10-CM | POA: Insufficient documentation

## 2015-03-27 HISTORY — DX: Chronic obstructive pulmonary disease, unspecified: J44.9

## 2015-03-27 HISTORY — DX: Essential (primary) hypertension: I10

## 2015-03-27 HISTORY — DX: Unspecified asthma, uncomplicated: J45.909

## 2015-03-27 HISTORY — DX: Type 2 diabetes mellitus without complications: E11.9

## 2015-03-27 HISTORY — DX: Disorder of kidney and ureter, unspecified: N28.9

## 2015-03-27 LAB — CBC
HEMATOCRIT: 36.3 % — AB (ref 40.0–52.0)
Hemoglobin: 12.4 g/dL — ABNORMAL LOW (ref 13.0–18.0)
MCH: 32.4 pg (ref 26.0–34.0)
MCHC: 34.1 g/dL (ref 32.0–36.0)
MCV: 94.9 fL (ref 80.0–100.0)
PLATELETS: 365 10*3/uL (ref 150–440)
RBC: 3.83 MIL/uL — AB (ref 4.40–5.90)
RDW: 14.8 % — ABNORMAL HIGH (ref 11.5–14.5)
WBC: 8.5 10*3/uL (ref 3.8–10.6)

## 2015-03-27 MED ORDER — ONDANSETRON 4 MG PO TBDP
4.0000 mg | ORAL_TABLET | Freq: Once | ORAL | Status: AC
  Administered 2015-03-27: 4 mg via ORAL
  Filled 2015-03-27: qty 1

## 2015-03-27 NOTE — ED Notes (Signed)
When pt heard that Dr Manson PasseyBrown and Dr Shaune PollackLord were attending EDPs tonight, pt stated, "I don't like Dr Manson PasseyBrown or Dr Mayford KnifeWilliams. They've run me out of here before for no good reason" due to his frequent visits. Pt states [facility].

## 2015-03-27 NOTE — ED Provider Notes (Signed)
[facility] Emergency Department Provider Note   ____________________________________________  Time seen: 11:30 PM I have reviewed the triage vital signs and the triage nursing note.  HISTORY  Chief Complaint Chest Pain   Historian Patient  HPI [name] is a 44 y.o. male who has a history of 2 MIs in the past, without any stent placement, and 2012, per the  patient, and a history of frequent chest pain/angina, who tonight around 8 PM developed central stabbing left-sided chest pain that radiated up into the left shoulder and left neck. He did take 3 nitroglycerin chest was at home with no relief. He takes 325 of aspirin every morning and he did take this this morning. This evening he states he took another 325 mg of aspirin upon onset of chest pain. States his cardiologist is at [facility]. He has some nausea. His chest pain is still moderate. He is reporting a history of TTP and trouble with obtaining peripheral IVs. He does have a history of IV drug use in the past. He states this is not active.    Past Medical History  Diagnosis Date  . Diabetes mellitus without complication   . Hypertension   . COPD (chronic obstructive pulmonary disease)   . Asthma   . Renal disorder     There are no active problems to display for this patient.   Past Surgical History  Procedure Laterality Date  . Cholecystectomy      No current outpatient prescriptions on file.  Allergies Demerol; Erythromycin; and Morphine and related  No family history on file.  Social History History  Substance Use Topics  . Smoking status: Current Every Day Smoker  . Smokeless tobacco: Not on file  . Alcohol Use: Yes    Review of Systems  Constitutional: Negative for fever. Eyes: Negative for visual changes. ENT: Negative for sore throat. Cardiovascular: Negative for palpitations. Respiratory: Negative for shortness of breath or cough. Gastrointestinal: Negative for  abdominal pain, vomiting and diarrhea. Genitourinary: Negative for dysuria. Musculoskeletal: Negative for back pain. Skin: Negative for rash. Neurological: Negative for headaches, focal weakness or numbness. 10 point Review of Systems otherwise negative ____________________________________________   PHYSICAL EXAM:  VITAL SIGNS: ED Triage Vitals  Enc Vitals Group     BP 03/27/15 2313 144/104 mmHg     Pulse Rate 03/27/15 2313 92     Resp 03/27/15 2313 16     Temp 03/27/15 2313 98.6 F (37 C)     Temp Source 03/27/15 2313 Oral     SpO2 03/27/15 2313 95 %     Weight 03/27/15 2313 175 lb (79.379 kg)     Height 03/27/15 2313  (1.854 m)     Head Cir --      Peak Flow --      Pain Score 03/27/15 2314 9     Pain Loc --      Pain Edu? --      Excl. in GC? --      Constitutional: Alert and oriented. In no distress. Eyes: Conjunctivae are normal. PERRL. Normal extraocular movements. ENT   Head: Normocephalic and atraumatic.   Nose: No congestion/rhinnorhea.   Mouth/Throat: Mucous membranes are moist.   Neck: No stridor. Cardiovascular/Chest: Normal rate, regular rhythm.  No murmurs, rubs, or gallops. Respiratory: Normal respiratory effort without tachypnea nor retractions. Breath sounds are clear and equal bilaterally. No wheezes/rales/rhonchi. Gastrointestinal: Soft. No distention, no guarding, no rebound. Nontender   Genitourinary/rectal:Deferred Musculoskeletal: Nontender with normal  range of motion in all extremities. No joint effusions.  No lower extremity tenderness nor edema. Neurologic:  Normal speech and language. No gross or focal neurologic deficits are appreciated. Skin:  Skin is warm, dry and intact. No rash noted. Psychiatric: Mood and affect are normal. Speech and behavior are normal. Patient exhibits appropriate insight and judgment.  ____________________________________________   EKG I, Governor Rooksebecca Satchel Heidinger, MD, the attending physician have personally  viewed and interpreted all ECGs.  88 bpm. Normal sinus rhythm. Rightward axis. Narrow QRS. Normal axis. Nonspecific T wave. ____________________________________________  LABS (pertinent positives/negatives)  White blood count 8.5, hemoglobin 12.4 Troponin less than 0.03 Complete metabolic panel without significant abnormality Repeat troponin less than 0.03  ____________________________________________  RADIOLOGY All Xrays were viewed by me. Imaging interpreted by Radiologist.  Chest x-ray portable: Negative  X-ray two-view abdomen: Negative __________________________________________  PROCEDURES  Procedure(s) performed: None Critical Care performed: None  ____________________________________________   ED COURSE / ASSESSMENT AND PLAN  CONSULTATIONS: None  Pertinent labs & imaging results that were available during my care of the patient were reviewed by me and considered in my medical decision making (see chart for details).   This patient patient is somewhat well-known to the ED for frequent presentation of chest pain, as well as psychiatric complaints. He does report a history of angina and prior MI as well as continued smoking as risk factors for ACS, however his EKG is nonspecific, without any concerning findings at present.  I discussed with the patient that were not pertinent treat with narcotic pain medication. He hasn't currently have a peripheral IV, and I think he can probably be managed at this point in time without the IV. Blood work was drawn. He is going to be given Zofran ODT.  Initial troponin negative. I will repeat at 3 hours.  Patient started complaining more about epigastric pain and a history of GI problems for acute [facility]. He asked for a GI cocktail which seemed to provide some improvement. Not significantly tender on palpation, I'm not concerned about obstruction, or abdominal emergency clinically.  Repeat troponin is  negative.  Patient has had these symptoms before, and I have asked him to follow up with his cardiologist and his gastroenterologist.  Patient / Family / Caregiver informed of clinical course, medical decision-making process, and agree with plan.   I discussed return precautions, follow-up instructions, and discharged instructions with patient and/or family.  ___________________________________________   FINAL CLINICAL IMPRESSION(S) / ED DIAGNOSES   Final diagnoses:  Chest pain, unspecified chest pain type  Epigastric pain    FOLLOW UP  [facility], and [facility]   Mahmood Boehringer, MD 03/28/15 [phone]

## 2015-03-27 NOTE — ED Notes (Signed)
Pt presents to ED with c/o chest pain that started around 8pm. Pt reports central chest crushing/stabbing chest pain that radiates into the left shoulder. EMS reports that the pt self administered 3 Nitro tablets and 2  ASA prior to their arrival. Pt reports some N/V; one episode of vomiting witnessed upon arrival. Pt has a PMH of angina.

## 2015-03-28 ENCOUNTER — Emergency Department: Payer: Medicare Other

## 2015-03-28 LAB — COMPREHENSIVE METABOLIC PANEL
ALT: 18 U/L (ref 17–63)
ANION GAP: 11 (ref 5–15)
AST: 27 U/L (ref 15–41)
Albumin: 3.7 g/dL (ref 3.5–5.0)
Alkaline Phosphatase: 181 U/L — ABNORMAL HIGH (ref 38–126)
BUN: 8 mg/dL (ref 6–20)
CALCIUM: 9.8 mg/dL (ref 8.9–10.3)
CHLORIDE: 103 mmol/L (ref 101–111)
CO2: 26 mmol/L (ref 22–32)
Creatinine, Ser: 0.96 mg/dL (ref 0.61–1.24)
GFR calc Af Amer: >60 mL/min (ref >60)
GFR calc non Af Amer: >60 mL/min (ref >60)
Glucose, Bld: 139 mg/dL — ABNORMAL HIGH (ref 65–99)
POTASSIUM: 3.6 mmol/L (ref 3.5–5.1)
Sodium: 140 mmol/L (ref 135–145)
Total Bilirubin: 0.3 mg/dL (ref 0.3–1.2)
Total Protein: 8.2 g/dL — ABNORMAL HIGH (ref 6.5–8.1)

## 2015-03-28 LAB — TROPONIN I: Troponin I: <0.03 ng/mL (ref <0.031)

## 2015-03-28 MED ORDER — GI COCKTAIL ~~LOC~~
30.0000 mL | Freq: Once | ORAL | Status: AC
  Administered 2015-03-28: 30 mL via ORAL
  Filled 2015-03-28: qty 30

## 2015-03-28 MED ORDER — KETOROLAC TROMETHAMINE 30 MG/ML IJ SOLN: 60.0000 mg | Freq: Once | INTRAMUSCULAR | Status: AC

## 2015-03-28 MED ORDER — PROMETHAZINE HCL 25 MG PO TABS
ORAL_TABLET | ORAL | Status: AC
  Administered 2015-03-28: 25 mg via ORAL
  Filled 2015-03-28: qty 1

## 2015-03-28 MED ORDER — PROMETHAZINE HCL 25 MG PO TABS
25.0000 mg | ORAL_TABLET | Freq: Once | ORAL | Status: AC
Start: 2015-03-28 — End: 2015-03-28
  Administered 2015-03-28: 25 mg via ORAL

## 2015-03-28 MED ORDER — KETOROLAC TROMETHAMINE 60 MG/2ML IM SOLN
INTRAMUSCULAR | Status: AC
  Administered 2015-03-28: 60 mg
  Filled 2015-03-28: qty 2

## 2015-03-28 NOTE — ED Notes (Signed)
Pt had another episode of emesis that covered his sheets and gown; approximately 200-37700mL noted in emesis bag. MD made aware.

## 2015-03-28 NOTE — Discharge Instructions (Signed)
No certain cause was found for your chest discomfort, however your exam and evaluation are reassuring today. Return to the emergency department for any new or worsening condition including chest pain, abdominal pain, nausea, vomiting, trouble breathing, shortness of breath, fever, numbness or weakness, altered mental status, or any other symptoms concerning to you.  You do need close follow-up with your cardiologist within 1-2 days. Call your cardiologist this morning to make an appointment.   Chest Pain (Nonspecific) It is often hard to give a specific diagnosis for the cause of chest pain. There is always a chance that your pain could be related to something serious, such as a heart attack or a blood clot in the lungs. You need to follow up with your health care provider for further evaluation. CAUSES   Heartburn.  Pneumonia or bronchitis.  Anxiety or stress.  Inflammation around your heart (pericarditis) or lung (pleuritis or pleurisy).  A blood clot in the lung.  A collapsed lung (pneumothorax). It can develop suddenly on its own (spontaneous pneumothorax) or from trauma to the chest.  Shingles infection (herpes zoster virus). The chest wall is composed of bones, muscles, and cartilage. Any of these can be the source of the pain.  The bones can be bruised by injury.  The muscles or cartilage can be strained by coughing or overwork.  The cartilage can be affected by inflammation and become sore (costochondritis). DIAGNOSIS  Lab tests or other studies may be needed to find the cause of your pain. Your health care provider may have you take a test called an ambulatory electrocardiogram (ECG). An ECG records your heartbeat patterns over a 24-hour period. You may also have other tests, such as:  Transthoracic echocardiogram (TTE). During echocardiography, sound waves are used to evaluate how blood flows through your heart.  Transesophageal echocardiogram (TEE).  Cardiac monitoring.  This allows your health care provider to monitor your heart rate and rhythm in real time.  Holter monitor. This is a portable device that records your heartbeat and can help diagnose heart arrhythmias. It allows your health care provider to track your heart activity for several days, if needed.  Stress tests by exercise or by giving medicine that makes the heart beat faster. TREATMENT   Treatment depends on what may be causing your chest pain. Treatment may include:  Acid blockers for heartburn.  Anti-inflammatory medicine.  Pain medicine for inflammatory conditions.  Antibiotics if an infection is present.  You may be advised to change lifestyle habits. This includes stopping smoking and avoiding alcohol, caffeine, and chocolate.  You may be advised to keep your head raised (elevated) when sleeping. This reduces the chance of acid going backward from your stomach into your esophagus. Most of the time, nonspecific chest pain will improve within 2-3 days with rest and mild pain medicine.  HOME CARE INSTRUCTIONS   If antibiotics were prescribed, take them as directed. Finish them even if you start to feel better.  For the next few days, avoid physical activities that bring on chest pain. Continue physical activities as directed.  Do not use any tobacco products, including cigarettes, chewing tobacco, or electronic cigarettes.  Avoid drinking alcohol.  Only take medicine as directed by your health care provider.  Follow your health care provider's suggestions for further testing if your chest pain does not go away.  Keep any follow-up appointments you made. If you do not go to an appointment, you could develop lasting (chronic) problems with pain. If there is any  problem keeping an appointment, call to reschedule. SEEK MEDICAL CARE IF:   Your chest pain does not go away, even after treatment.  You have a rash with blisters on your chest.  You have a fever. SEEK IMMEDIATE  MEDICAL CARE IF:   You have increased chest pain or pain that spreads to your arm, neck, jaw, back, or abdomen.  You have shortness of breath.  You have an increasing cough, or you cough up blood.  You have severe back or abdominal pain.  You feel nauseous or vomit.  You have severe weakness.  You faint.  You have chills. This is an emergency. Do not wait to see if the pain will go away. Get medical help at once. Call your local emergency services (911 in [place] Do not drive yourself to the hospital. MAKE SURE YOU:   Understand these instructions.  Will watch your condition.  Will get help right away if you are not doing well or get worse. Document Released: 06/10/2005 Document Revised: 09/05/2013 Document Reviewed: 04/05/2008 [facility] Patient Information 2015 [place], [place]. This information is not intended to replace advice given to you by your health care provider. Make sure you discuss any questions you have with your health care provider.

## 2015-04-11 IMAGING — US US CAROTID DUPLEX BILAT
1 series · 14 of 24 positions shown · non-contrast
Comparison: none

REASON FOR EXAM: CVA
COMMENTS:

[Series 1: us carotid duplex bilat · 0.07mm/px · 14 of 58 slices shown]
[im 1/58]
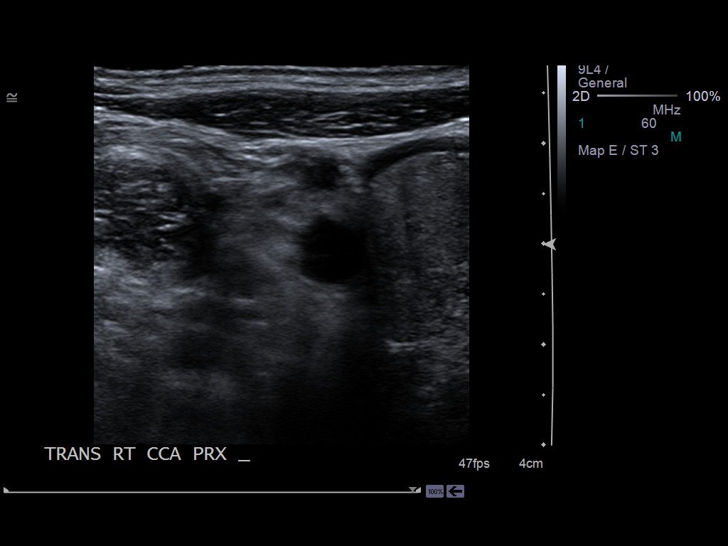
[im 5/58]
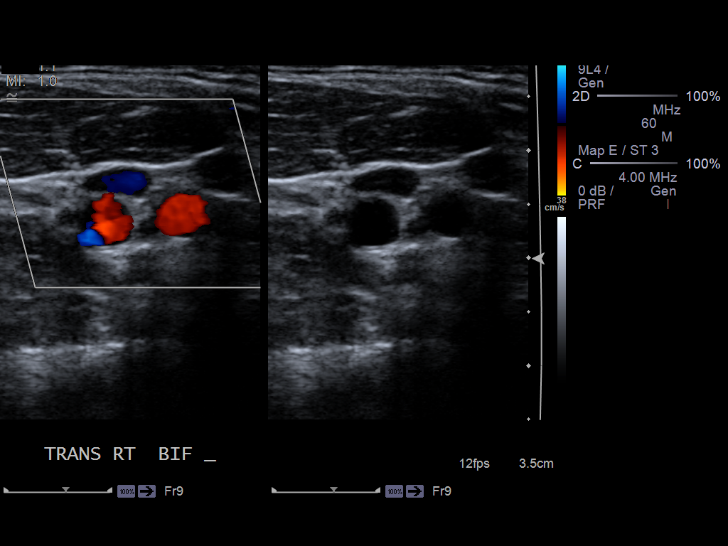
[im 10/58]
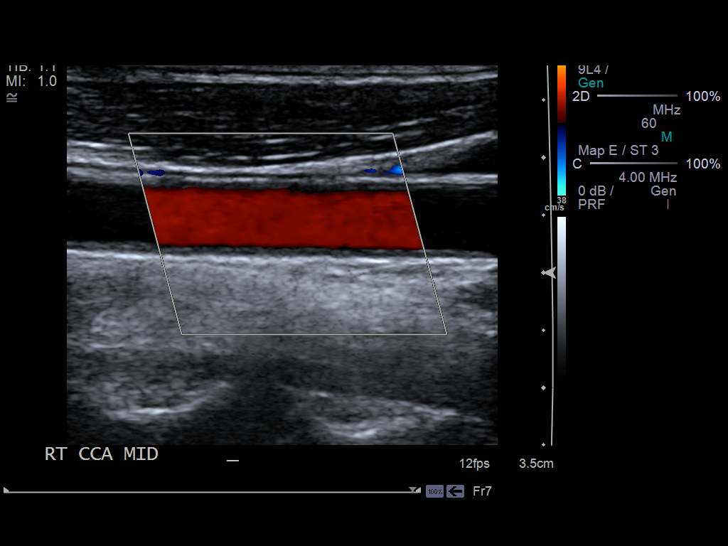
[im 15/58]
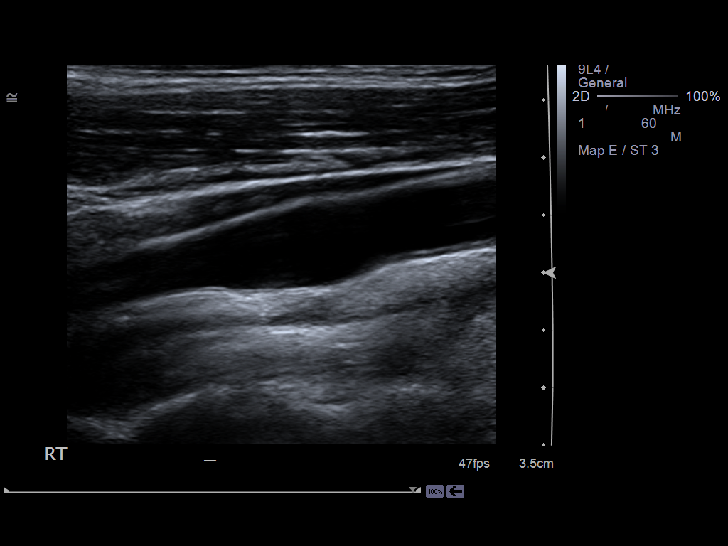
[im 18/58]
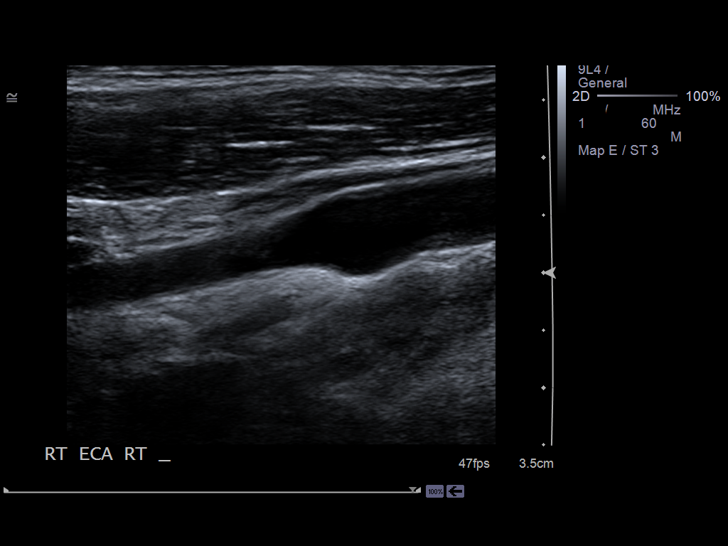
[im 23/58]
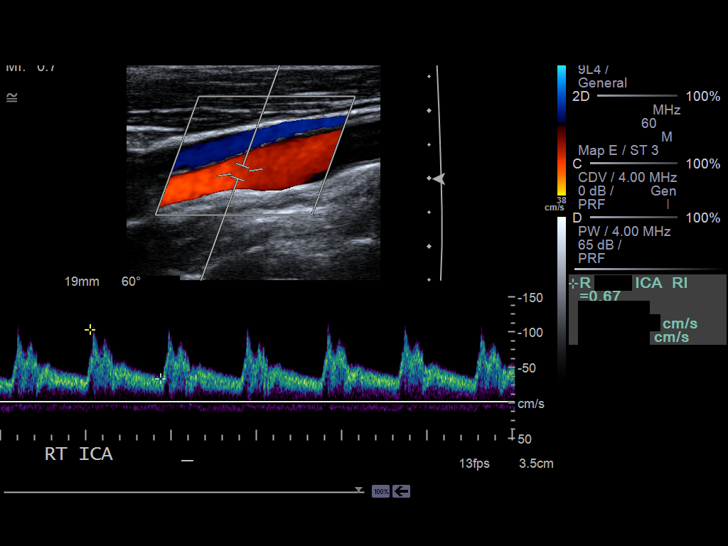
[im 28/58]
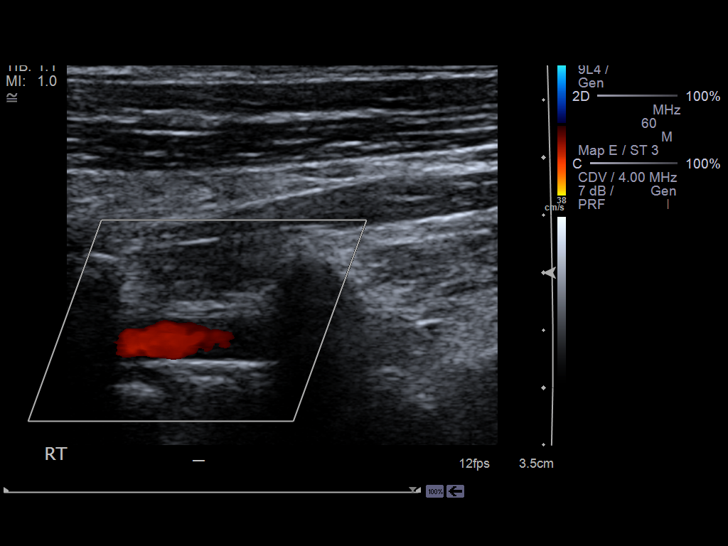
[im 30/58]
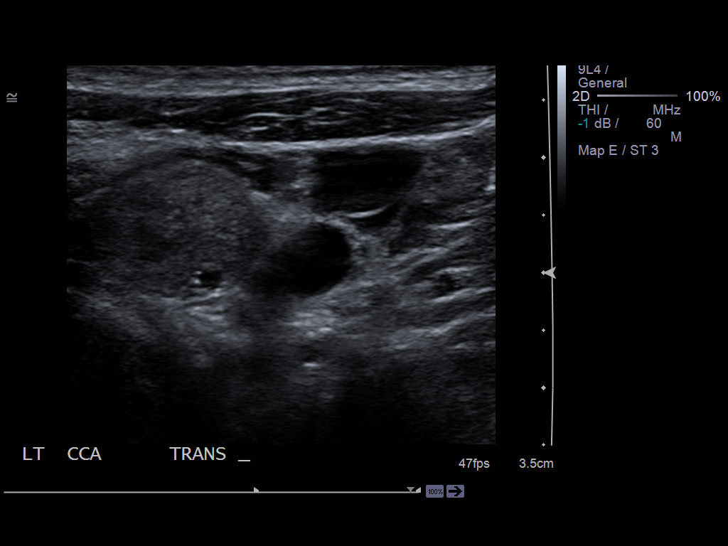
[im 35/58]
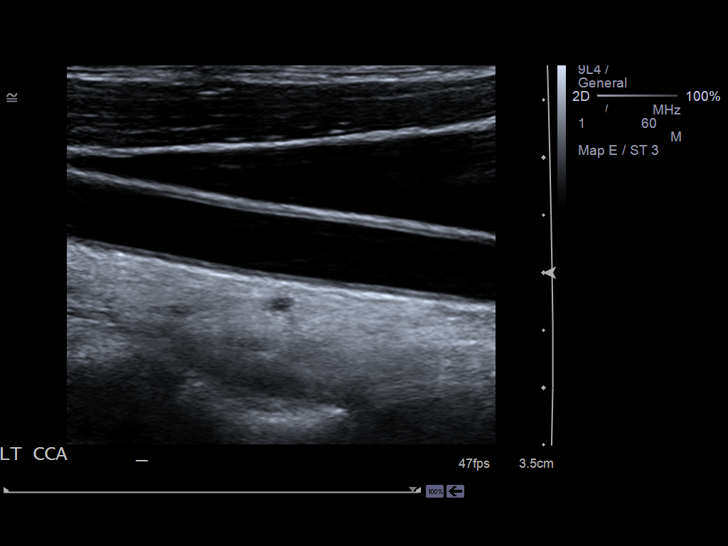
[im 40/58]
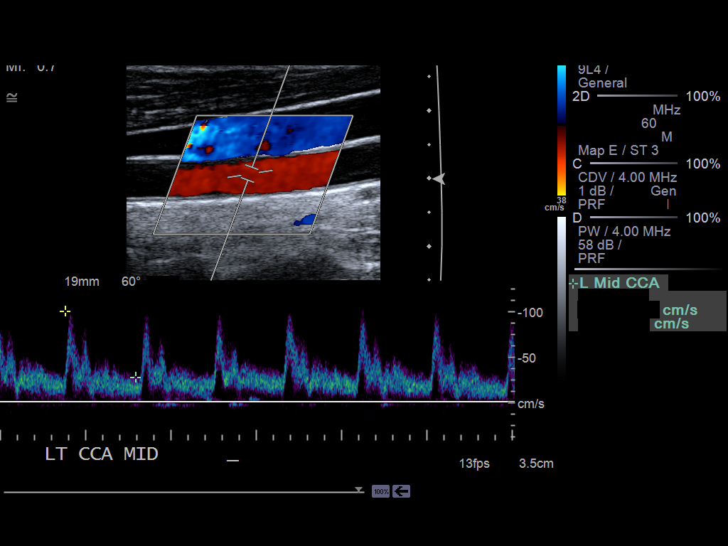
[im 45/58]
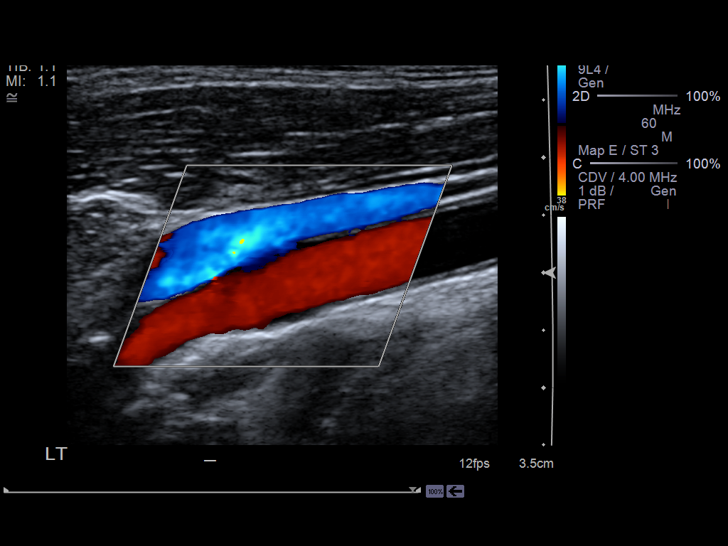
[im 48/58]
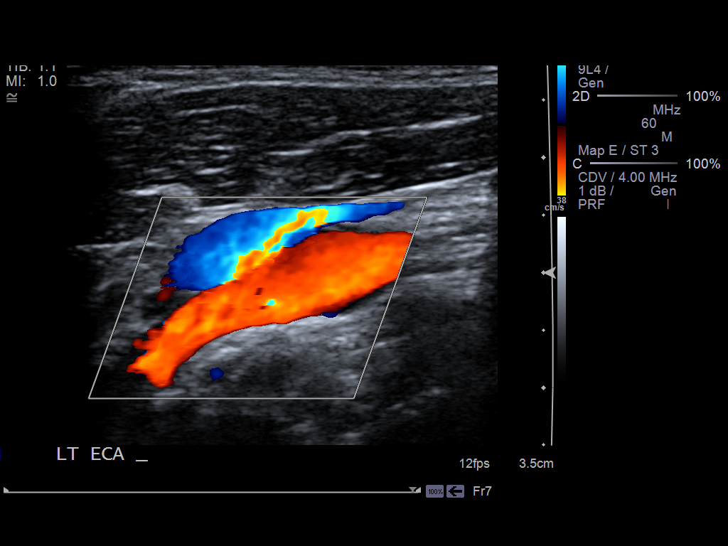
[im 53/58]
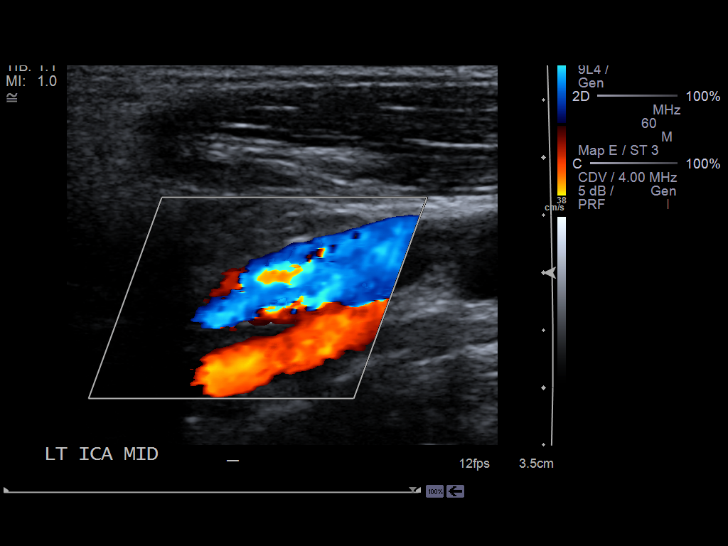
[im 58/58]
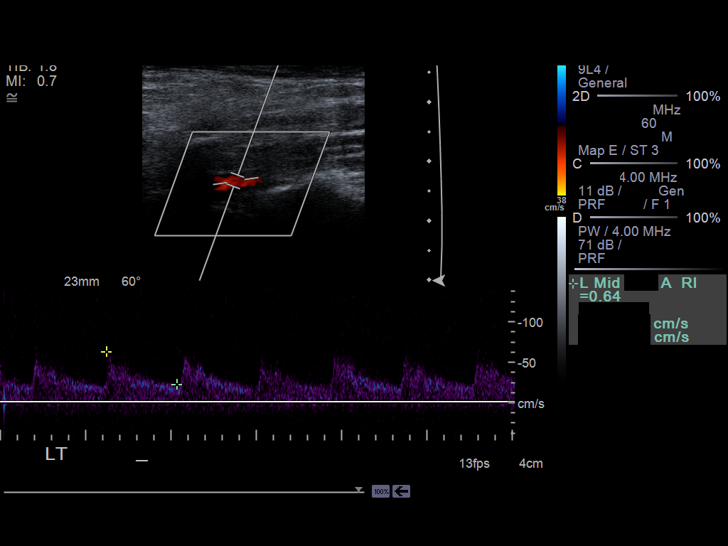

[14 of 24 positions shown; findings below may reference images not displayed]

PROCEDURE:     US  - US CAROTID DOPPLER BILATERAL  - January 24, 2013  [DATE]

RESULT:     Carotid Doppler interrogation sonographically shows no
significant atherosclerotic plaque. There is no ulceration or calcification.
There is visually no evidence of narrowing. The color Doppler and spectral
Doppler appearance is normal in both carotid systems. There is antegrade
flow in both vertebral arteries without flow reversal. The peak systolic
velocities are normal bilaterally. The internal to common carotid peak
systolic velocity ratio is 0.90 on the right and 0.86 on the left.
IMPRESSION: 1. No significant atherosclerotic disease. No evidence of hemodynamically
significant stenosis. This is unchanged compared previous exam dated [DATE]

[REDACTED]

## 2015-05-08 ENCOUNTER — Emergency Department: Payer: Medicare Other

## 2015-05-08 ENCOUNTER — Inpatient Hospital Stay
Admission: EM | Admit: 2015-05-08 | Discharge: 2015-05-08 | DRG: 639 | Disposition: A | Discharge status: Home / Self Care | Payer: Medicare Other | Attending: Internal Medicine | Admitting: Internal Medicine

## 2015-05-08 ENCOUNTER — Encounter: Payer: Self-pay | Admitting: Urgent Care

## 2015-05-08 DIAGNOSIS — L97529 Non-pressure chronic ulcer of other part of left foot with unspecified severity: Secondary | ICD-10-CM | POA: Diagnosis present

## 2015-05-08 DIAGNOSIS — Z7952 Long term (current) use of systemic steroids: Secondary | ICD-10-CM | POA: Diagnosis not present

## 2015-05-08 DIAGNOSIS — E11628 Type 2 diabetes mellitus with other skin complications: Secondary | ICD-10-CM | POA: Diagnosis present

## 2015-05-08 DIAGNOSIS — E1140 Type 2 diabetes mellitus with diabetic neuropathy, unspecified: Secondary | ICD-10-CM | POA: Diagnosis present

## 2015-05-08 DIAGNOSIS — I252 Old myocardial infarction: Secondary | ICD-10-CM

## 2015-05-08 DIAGNOSIS — Z881 Allergy status to other antibiotic agents status: Secondary | ICD-10-CM

## 2015-05-08 DIAGNOSIS — I10 Essential (primary) hypertension: Secondary | ICD-10-CM | POA: Diagnosis present

## 2015-05-08 DIAGNOSIS — J45909 Unspecified asthma, uncomplicated: Secondary | ICD-10-CM | POA: Diagnosis present

## 2015-05-08 DIAGNOSIS — Z886 Allergy status to analgesic agent status: Secondary | ICD-10-CM | POA: Diagnosis not present

## 2015-05-08 DIAGNOSIS — Z9049 Acquired absence of other specified parts of digestive tract: Secondary | ICD-10-CM | POA: Diagnosis present

## 2015-05-08 DIAGNOSIS — M79672 Pain in left foot: Secondary | ICD-10-CM | POA: Diagnosis present

## 2015-05-08 DIAGNOSIS — E11621 Type 2 diabetes mellitus with foot ulcer: Secondary | ICD-10-CM | POA: Diagnosis present

## 2015-05-08 DIAGNOSIS — I2510 Atherosclerotic heart disease of native coronary artery without angina pectoris: Secondary | ICD-10-CM | POA: Diagnosis present

## 2015-05-08 DIAGNOSIS — Z794 Long term (current) use of insulin: Secondary | ICD-10-CM | POA: Diagnosis not present

## 2015-05-08 DIAGNOSIS — E785 Hyperlipidemia, unspecified: Secondary | ICD-10-CM | POA: Diagnosis present

## 2015-05-08 DIAGNOSIS — L089 Local infection of the skin and subcutaneous tissue, unspecified: Secondary | ICD-10-CM

## 2015-05-08 DIAGNOSIS — E1151 Type 2 diabetes mellitus with diabetic peripheral angiopathy without gangrene: Secondary | ICD-10-CM | POA: Diagnosis present

## 2015-05-08 DIAGNOSIS — Z86718 Personal history of other venous thrombosis and embolism: Secondary | ICD-10-CM

## 2015-05-08 DIAGNOSIS — J449 Chronic obstructive pulmonary disease, unspecified: Secondary | ICD-10-CM | POA: Diagnosis present

## 2015-05-08 DIAGNOSIS — E119 Type 2 diabetes mellitus without complications: Secondary | ICD-10-CM

## 2015-05-08 DIAGNOSIS — Z720 Tobacco use: Secondary | ICD-10-CM

## 2015-05-08 HISTORY — DX: Thrombocytopenia, unspecified: D69.6

## 2015-05-08 HISTORY — DX: Acute myocardial infarction, unspecified: I21.9

## 2015-05-08 LAB — COMPREHENSIVE METABOLIC PANEL
ALT: 13 U/L — ABNORMAL LOW (ref 17–63)
AST: 20 U/L (ref 15–41)
Albumin: 3.6 g/dL (ref 3.5–5.0)
Alkaline Phosphatase: 158 U/L — ABNORMAL HIGH (ref 38–126)
Anion gap: 7 (ref 5–15)
BILIRUBIN TOTAL: 0.5 mg/dL (ref 0.3–1.2)
BUN: 9 mg/dL (ref 6–20)
CO2: 26 mmol/L (ref 22–32)
Calcium: 8.8 mg/dL — ABNORMAL LOW (ref 8.9–10.3)
Chloride: 101 mmol/L (ref 101–111)
Creatinine, Ser: 1.12 mg/dL (ref 0.61–1.24)
GFR calc Af Amer: >60 mL/min (ref >60)
Glucose, Bld: 89 mg/dL (ref 65–99)
POTASSIUM: 4 mmol/L (ref 3.5–5.1)
Sodium: 134 mmol/L — ABNORMAL LOW (ref 135–145)
TOTAL PROTEIN: 7.3 g/dL (ref 6.5–8.1)

## 2015-05-08 LAB — CBC
HEMATOCRIT: 31.1 % — AB (ref 40.0–52.0)
Hemoglobin: 10.9 g/dL — ABNORMAL LOW (ref 13.0–18.0)
MCH: 33 pg (ref 26.0–34.0)
MCHC: 34.9 g/dL (ref 32.0–36.0)
MCV: 94.5 fL (ref 80.0–100.0)
Platelets: 341 10*3/uL (ref 150–440)
RBC: 3.29 MIL/uL — ABNORMAL LOW (ref 4.40–5.90)
RDW: 16.5 % — ABNORMAL HIGH (ref 11.5–14.5)
WBC: 4.4 10*3/uL (ref 3.8–10.6)

## 2015-05-08 LAB — GLUCOSE, CAPILLARY: GLUCOSE-CAPILLARY: 96 mg/dL (ref 65–99)

## 2015-05-08 LAB — SEDIMENTATION RATE: Sed Rate: 43 mm/hr — ABNORMAL HIGH (ref 0–16)

## 2015-05-08 MED ORDER — RIVAROXABAN 20 MG PO TABS: 20.0000 mg | ORAL_TABLET | Freq: Two times a day (BID) | ORAL | Status: DC

## 2015-05-08 MED ORDER — GABAPENTIN 300 MG PO CAPS
600.0000 mg | ORAL_CAPSULE | Freq: Three times a day (TID) | ORAL | Status: DC
  Administered 2015-05-08: 600 mg via ORAL
  Filled 2015-05-08: qty 2

## 2015-05-08 MED ORDER — ISOSORBIDE MONONITRATE ER 30 MG PO TB24: 30.0000 mg | ORAL_TABLET | Freq: Two times a day (BID) | ORAL | Status: DC

## 2015-05-08 MED ORDER — VANCOMYCIN HCL IN DEXTROSE 1-5 GM/200ML-% IV SOLN
INTRAVENOUS | Status: AC
  Administered 2015-05-08: 1000 mg via INTRAVENOUS
  Filled 2015-05-08: qty 200

## 2015-05-08 MED ORDER — SODIUM CHLORIDE 0.9 % IV BOLUS (SEPSIS)
1000.0000 mL | Freq: Once | INTRAVENOUS | Status: AC
  Administered 2015-05-08: 1000 mL via INTRAVENOUS

## 2015-05-08 MED ORDER — ACETAMINOPHEN 325 MG PO TABS: 650.0000 mg | ORAL_TABLET | Freq: Four times a day (QID) | ORAL | Status: DC | PRN

## 2015-05-08 MED ORDER — TIOTROPIUM BROMIDE MONOHYDRATE 18 MCG IN CAPS
1.0000 | ORAL_CAPSULE | Freq: Every day | RESPIRATORY_TRACT | Status: DC
  Administered 2015-05-08: 18 ug via RESPIRATORY_TRACT
  Filled 2015-05-08: qty 5

## 2015-05-08 MED ORDER — VANCOMYCIN HCL IN DEXTROSE 1-5 GM/200ML-% IV SOLN
1000.0000 mg | Freq: Once | INTRAVENOUS | Status: AC
  Administered 2015-05-08: 1000 mg via INTRAVENOUS
  Filled 2015-05-08: qty 200

## 2015-05-08 MED ORDER — RIVAROXABAN 20 MG PO TABS
20.0000 mg | ORAL_TABLET | Freq: Every day | ORAL | Status: DC
Start: 2015-05-08 — End: 2015-05-08
  Administered 2015-05-08: 20 mg via ORAL
  Filled 2015-05-08: qty 1

## 2015-05-08 MED ORDER — ONDANSETRON HCL 4 MG PO TABS: 4.0000 mg | ORAL_TABLET | Freq: Four times a day (QID) | ORAL | Status: DC | PRN

## 2015-05-08 MED ORDER — HYDROMORPHONE HCL 1 MG/ML IJ SOLN
INTRAMUSCULAR | Status: AC
  Filled 2015-05-08: qty 1

## 2015-05-08 MED ORDER — FENTANYL CITRATE (PF) 100 MCG/2ML IJ SOLN
INTRAMUSCULAR | Status: AC
  Administered 2015-05-08: 50 ug via INTRAVENOUS
  Filled 2015-05-08: qty 2

## 2015-05-08 MED ORDER — HYDROCODONE-ACETAMINOPHEN 5-325 MG PO TABS
ORAL_TABLET | ORAL | Status: AC
  Filled 2015-05-08: qty 2

## 2015-05-08 MED ORDER — INSULIN ASPART 100 UNIT/ML ~~LOC~~ SOLN
0.0000 [IU] | Freq: Three times a day (TID) | SUBCUTANEOUS | Status: DC
Start: 2015-05-08 — End: 2015-05-08

## 2015-05-08 MED ORDER — PIPERACILLIN-TAZOBACTAM 3.375 G IVPB
INTRAVENOUS | Status: AC
  Administered 2015-05-08: 3.375 g via INTRAVENOUS
  Filled 2015-05-08: qty 50

## 2015-05-08 MED ORDER — CARVEDILOL 12.5 MG PO TABS
12.5000 mg | ORAL_TABLET | Freq: Two times a day (BID) | ORAL | Status: DC
  Filled 2015-05-08 (×2): qty 1

## 2015-05-08 MED ORDER — VANCOMYCIN HCL IN DEXTROSE 1-5 GM/200ML-% IV SOLN
1000.0000 mg | Freq: Once | INTRAVENOUS | Status: AC
  Administered 2015-05-08: 1000 mg via INTRAVENOUS

## 2015-05-08 MED ORDER — SUCRALFATE 1 G PO TABS
1.0000 g | ORAL_TABLET | Freq: Every day | ORAL | Status: DC
  Administered 2015-05-08: 1 g via ORAL
  Filled 2015-05-08: qty 1

## 2015-05-08 MED ORDER — TRAMADOL HCL 50 MG PO TABS
50.0000 mg | ORAL_TABLET | Freq: Once | ORAL | Status: AC
  Administered 2015-05-08: 50 mg via ORAL
  Filled 2015-05-08: qty 1

## 2015-05-08 MED ORDER — QUETIAPINE FUMARATE 300 MG PO TABS: 400.0000 mg | ORAL_TABLET | Freq: Every day | ORAL | Status: DC

## 2015-05-08 MED ORDER — PANTOPRAZOLE SODIUM 40 MG PO TBEC
40.0000 mg | DELAYED_RELEASE_TABLET | Freq: Two times a day (BID) | ORAL | Status: DC
  Administered 2015-05-08: 40 mg via ORAL
  Filled 2015-05-08: qty 1

## 2015-05-08 MED ORDER — FENTANYL CITRATE (PF) 100 MCG/2ML IJ SOLN
50.0000 ug | Freq: Once | INTRAMUSCULAR | Status: AC
  Administered 2015-05-08: 50 ug via INTRAVENOUS

## 2015-05-08 MED ORDER — HYDROCODONE-ACETAMINOPHEN 5-325 MG PO TABS
1.0000 | ORAL_TABLET | ORAL | Status: DC | PRN
  Administered 2015-05-08: 2 via ORAL
  Administered 2015-05-08: 1 via ORAL
  Filled 2015-05-08: qty 2

## 2015-05-08 MED ORDER — PRAVASTATIN SODIUM 20 MG PO TABS: 20.0000 mg | ORAL_TABLET | Freq: Every day | ORAL | Status: DC

## 2015-05-08 MED ORDER — IPRATROPIUM-ALBUTEROL 0.5-2.5 (3) MG/3ML IN SOLN
3.0000 mL | Freq: Four times a day (QID) | RESPIRATORY_TRACT | Status: DC
  Administered 2015-05-08: 3 mL via RESPIRATORY_TRACT
  Filled 2015-05-08 (×2): qty 3

## 2015-05-08 MED ORDER — NICOTINE 10 MG IN INHA
1.0000 | RESPIRATORY_TRACT | Status: DC | PRN
  Filled 2015-05-08: qty 36

## 2015-05-08 MED ORDER — VANCOMYCIN HCL IN DEXTROSE 1-5 GM/200ML-% IV SOLN
1000.0000 mg | Freq: Two times a day (BID) | INTRAVENOUS | Status: DC
  Filled 2015-05-08 (×2): qty 200

## 2015-05-08 MED ORDER — OXYCODONE HCL ER 15 MG PO T12A
30.0000 mg | EXTENDED_RELEASE_TABLET | Freq: Two times a day (BID) | ORAL | Status: DC
  Administered 2015-05-08: 30 mg via ORAL
  Filled 2015-05-08: qty 2

## 2015-05-08 MED ORDER — CARBAMAZEPINE 200 MG PO TABS
200.0000 mg | ORAL_TABLET | Freq: Every day | ORAL | Status: DC
  Administered 2015-05-08: 200 mg via ORAL
  Filled 2015-05-08: qty 1

## 2015-05-08 MED ORDER — PIPERACILLIN-TAZOBACTAM 3.375 G IVPB
3.3750 g | Freq: Once | INTRAVENOUS | Status: AC
  Administered 2015-05-08: 3.375 g via INTRAVENOUS

## 2015-05-08 MED ORDER — DOCUSATE SODIUM 100 MG PO CAPS
100.0000 mg | ORAL_CAPSULE | Freq: Two times a day (BID) | ORAL | Status: DC
  Administered 2015-05-08: 100 mg via ORAL
  Filled 2015-05-08: qty 1

## 2015-05-08 MED ORDER — TAPENTADOL HCL ER 200 MG PO TB12
1.0000 | ORAL_TABLET | Freq: Two times a day (BID) | ORAL | Status: DC
Start: 2015-05-08 — End: 2015-05-08

## 2015-05-08 MED ORDER — ONDANSETRON HCL 4 MG/2ML IJ SOLN: 4.0000 mg | Freq: Four times a day (QID) | INTRAMUSCULAR | Status: DC | PRN

## 2015-05-08 MED ORDER — ONDANSETRON HCL 4 MG/2ML IJ SOLN
4.0000 mg | Freq: Once | INTRAMUSCULAR | Status: AC
  Administered 2015-05-08: 4 mg via INTRAVENOUS
  Filled 2015-05-08: qty 2

## 2015-05-08 MED ORDER — PIPERACILLIN-TAZOBACTAM 3.375 G IVPB
3.3750 g | Freq: Three times a day (TID) | INTRAVENOUS | Status: DC
  Filled 2015-05-08 (×4): qty 50

## 2015-05-08 MED ORDER — SODIUM CHLORIDE 0.9 % IV SOLN
INTRAVENOUS | Status: DC
  Administered 2015-05-08: 09:00:00 via INTRAVENOUS

## 2015-05-08 MED ORDER — ACETAMINOPHEN 650 MG RE SUPP: 650.0000 mg | Freq: Four times a day (QID) | RECTAL | Status: DC | PRN

## 2015-05-08 MED ORDER — ASPIRIN EC 81 MG PO TBEC
81.0000 mg | DELAYED_RELEASE_TABLET | Freq: Every day | ORAL | Status: DC
  Administered 2015-05-08: 81 mg via ORAL
  Filled 2015-05-08: qty 1

## 2015-05-08 NOTE — Progress Notes (Signed)
ANTIBIOTIC CONSULT NOTE - INITIAL  Pharmacy Consult for Vancomycin+Zosyn Indication: necrotizing foot infection   Vancomycin  IV x1 in ED-- foot infection--Day 0 (start 8/24) Zosyn 3.375mg  IV x1 in ED-- foot infection--Day 0 (start 8/24)  Allergies  Allergen Reactions  . Demerol [Meperidine] Anaphylaxis  . Erythromycin   . Morphine And Related Itching    Patient Measurements: Height: 6' (182.9 cm) Weight: 154 lb 14.4 oz (70.262 kg) IBW/kg (Calculated) : 77.6 Adjusted Body Weight: 70.2 kg  Vital Signs: Temp: 97.9 F (36.6 C) (08/24 0817) Temp Source: Oral (08/24 0817) BP: 118/75 mmHg (08/24 0817) Pulse Rate: 86 (08/24 0817)  Labs:  Recent Labs  05/08/15 0311  WBC 4.4  HGB 10.9*  PLT 341  CREATININE 1.12   Estimated Creatinine Clearance: 83.7 mL/min (by C-G formula based on Cr of 1.12).  Microbiology: Recent Results (from the past 720 hour(s))  Culture, blood (routine x 2)     Status: None (Preliminary result)   Collection Time: 05/08/15  3:11 AM  Result Value Ref Range Status   Specimen Description BLOOD BLOOD RIGHT FOREARM  Final   Special Requests BOTTLES DRAWN AEROBIC AND ANAEROBIC  Final   Culture NO GROWTH < 12 HOURS  Final   Report Status PENDING  Incomplete  Culture, blood (routine x 2)     Status: None (Preliminary result)   Collection Time: 05/08/15  3:15 AM  Result Value Ref Range Status   Specimen Description BLOOD BLOOD RIGHT FOREARM  Final   Special Requests   Final    BOTTLES DRAWN AEROBIC AND ANAEROBIC 4MLAEROBIC,2MLANAEROBIC   Culture NO GROWTH < 12 HOURS  Final   Report Status PENDING  Incomplete    Medical History: Past Medical History  Diagnosis Date  . Diabetes mellitus without complication   . Hypertension   . COPD (chronic obstructive pulmonary disease)   . Asthma   . Renal disorder   . Thrombocytopenia   . Myocardial infarction     Medications:  Scheduled:  . aspirin EC  81 mg Oral Daily  . carbamazepine  200 mg  Oral Daily  . carvedilol  12.5 mg Oral BID  . docusate sodium  100 mg Oral BID  . gabapentin  600 mg Oral TID  . HYDROcodone-acetaminophen      . insulin aspart  0-9 Units Subcutaneous TID WC  . ipratropium-albuterol  3 mL Nebulization Q6H  . isosorbide mononitrate  30 mg Oral BID  . OxyCODONE  30 mg Oral Q12H  . pantoprazole  40 mg Oral BID  . piperacillin-tazobactam (ZOSYN)  IV  3.375 g Intravenous 3 times per day  . pravastatin  20 mg Oral QHS  . QUEtiapine  400 mg Oral QHS  . rivaroxaban  20 mg Oral BID  . sucralfate  1 g Oral Daily  . tiotropium  1 capsule Inhalation Daily  . vancomycin  1,000 mg Intravenous Once  . vancomycin  1,000 mg Intravenous Q12H   Infusions:  . sodium chloride 75 mL/hr at 05/08/15 0859   Assessment: [name] is a 44 year old male admitted for necrotizing foot infection being treated empirically with vancomycin and zosyn. Cultures pending.  Goal of Therapy:  Vancomycin trough level 15-20 mcg/ml due to necrotizing nature of infection  Plan:  Wt=70.2kg CrCl 84 ml/min Ke= 0.073 hr-1 T1/2= 9.5 hrs Vd=49 L  Vancomycin at  IV q12hrs.  Estimated Cpk= 41.6 and Ctr= 18.5. Followed stacked dosing. Will check vancomycin trough at 2330 on 8/25 and adjust  accordingly.  Zosyn 3.375mg  EIV q8hrs  Follow up culture results  Cy Blamer 05/08/2015,9:15 AM

## 2015-05-08 NOTE — ED Notes (Signed)
Patient will require admission secondary to (+) radiological findings on the plain films of his LLE. Patient offered admission here or at [facility] where his surgery was performed on his foot in May. Patient electing to remain at [facility] at this time.

## 2015-05-08 NOTE — ED Notes (Addendum)
RN to bedside for patient reassessment. Patient appears comfortable at this time. Observed resting in a RIGHT side lying position with even and non-labored respirations. Patient easy to arouse to verbal and tactile stimulation. Patient reports that pain is better, but he would like for RN to ask MD for something else. RN out of room; spoke with Zenda Alpers, MD who gave a VORB for Dilaudid  IVP. RN returns to bedside to administer the MD ordered medication and noted that patient's BP was low; SBP is 85 at this time. Patient was sleeping; BP reassessed and SBP found to be 88; MD made aware. VORB to hold Dilaudid and give patient a 1L NS bolus. Orders to be entered and carried by this RN.

## 2015-05-08 NOTE — ED Notes (Signed)
Pt to the ED tonight reporting he has had increased drainage and swelling in his left foot. Pt had wound procedure completed by General Surgery at [facility] at the end of May. Reports intermittent swelling in his foot, but the swelling has been constant since yesterday, associated with increased drainage. The wound has never completely healed since the procedure. States he was told if the swelling lasted more than 12 hours to be evaluated in the ED for the same. Reports glucose levels have been 150-200 at home. Taking Nucynta and Oxycodone for pain.

## 2015-05-08 NOTE — ED Notes (Signed)
Patient's BP has improved; SBP currently 96. Patient once again asking for pain medication. MD made aware. VORB for Fentanyl IVP x 1 dose now. Order to be entered and carried by this RN.

## 2015-05-08 NOTE — Discharge Summary (Signed)
[facility] [place] at [facility]   PATIENT NAME: [name]    MR#:  [id number]  DATE OF BIRTH:  [birth year]  DATE OF ADMISSION:  05/08/2015 ADMITTING PHYSICIAN: Crissie Figures, MD  DATE OF DISCHARGE: 05/08/2015  PRIMARY CARE PHYSICIAN: No primary care Danzig Macgregor on file.    ADMISSION DIAGNOSIS:  Diabetic foot infection [E11.69, L08.9]  DISCHARGE DIAGNOSIS:  Chronic left foot wound Chronic left foot xray showing gas  SECONDARY DIAGNOSIS:   Past Medical History  Diagnosis Date  . Diabetes mellitus without complication   . Hypertension   . COPD (chronic obstructive pulmonary disease)   . Asthma   . Renal disorder   . Thrombocytopenia   . Myocardial infarction     HOSPITAL COURSE:   44 year old male with history of diabetes mellitus type 2, hypertension, COPD, coronary artery disease/MI, history of DVT on Xarelto, left foot diabetic wound status post debridement in May 2016 presents with the complaints of increasing drainage with swelling and pain of left foot of one to 2 days' duration. 1.chronic Left foot wound infection,minimal local drainage w/o cellulitis -looked up [facility] notes. Has had several rounds of abxs in the last several months. Just finished cipro and linezolid on 8/1 -wbc normal -aferbile -seen by dr cline. No further need fro abx or any further eval -X-ray foot with extensive subcutaneous air involving entire foot and lower leg concerning for necrotizing soft tissue infection which appears chronic -clinically no s/o nec fascitis   2. Diabetes mellitus type 2, on sliding scale insulin. Stable clinically. Continue same, follow blood sugars closely.  3. Hypertension, BP low normal now. Monitor blood pressure closely, follow-up accordingly.  4. COPD, stable on home medications. Continue same.  5. History of DVT, on Xarelto. Continue same.  6. Coronary artery disease/MI, stable. No acute problems.   7. Tobacco usage.  Counseled to quit, patient not motivated.  Overall stable D/c home with [facility] Pt will not be given any pain meds   DISCHARGE CONDITIONS:   fair CONSULTS OBTAINED:  Treatment Team:  Linus Galas, MD  DRUG ALLERGIES:   Allergies  Allergen Reactions  . Demerol [Meperidine] Anaphylaxis  . Erythromycin   . Morphine And Related Itching    DISCHARGE MEDICATIONS:   Current Discharge Medication List    CONTINUE these medications which have NOT CHANGED   Details  acetaminophen (TYLENOL) 500 MG tablet Take 1,000 mg by mouth every 6 (six) hours as needed.    albuterol (PROAIR HFA) 108 (90 BASE) MCG/ACT inhaler Inhale 2 puffs into the lungs every 4 (four) hours as needed.    carbamazepine (TEGRETOL) 200 MG tablet Take 1 tablet by mouth daily.    carvedilol (COREG) 12.5 MG tablet Take 1 tablet by mouth 2 (two) times daily.    docusate sodium (COLACE) 100 MG capsule Take 100 mg by mouth 2 (two) times daily.    gabapentin (NEURONTIN) 300 MG capsule Take 2 capsules by mouth 3 (three) times daily.    insulin aspart (NOVOLOG) 100 UNIT/ML injection Inject 2 Units into the skin See admin instructions. Inject 2 units three times a day for every 20 mg/dl of blood glucose above 150mg /dl    isosorbide mononitrate (IMDUR) 30 MG 24 hr tablet Take 1 tablet by mouth 2 (two) times daily.    Oxycodone HCl 10 MG TABS Take 1 tablet by mouth every 8 (eight) hours as needed.    pantoprazole (PROTONIX) 40 MG tablet Take 40 mg by  mouth 2 (two) times daily.    pravastatin (PRAVACHOL) 20 MG tablet Take 1 tablet by mouth at bedtime.    predniSONE (DELTASONE) 20 MG tablet Take 3 tablets by mouth daily.    QUEtiapine (SEROQUEL) 400 MG tablet Take 1 tablet by mouth at bedtime.    rivaroxaban (XARELTO) 20 MG TABS tablet Take 20 mg by mouth daily.     sucralfate (CARAFATE) 1 G tablet Take 1 g by mouth daily.    Tapentadol HCl (NUCYNTA ER) 200 MG TB12 Take 1 tablet by mouth every 12 (twelve)  hours.    tiotropium (SPIRIVA) 18 MCG inhalation capsule Place 1 capsule into inhaler and inhale 2 (two) times daily.         If you experience worsening of your admission symptoms, develop shortness of breath, life threatening emergency, suicidal or homicidal thoughts you must seek medical attention immediately by calling 911 or calling your MD immediately  if symptoms less severe.  You Must read complete instructions/literature along with all the possible adverse reactions/side effects for all the Medicines you take and that have been prescribed to you. Take any new Medicines after you have completely understood and accept all the possible adverse reactions/side effects.   Please note  You were cared for by a hospitalist during your hospital stay. If you have any questions about your discharge medications or the care you received while you were in the hospital after you are discharged, you can call the unit and asked to speak with the hospitalist on call if the hospitalist that took care of you is not available. Once you are discharged, your primary care physician will handle any further medical issues. Please note that NO REFILLS for any discharge medications will be authorized once you are discharged, as it is imperative that you return to your primary care physician (or establish a relationship with a primary care physician if you do not have one) for your aftercare needs so that they can reassess your need for medications and monitor your lab values. Today   SUBJECTIVE  Doing well   VITAL SIGNS:  Blood pressure 118/75, pulse 86, temperature 97.9 F (36.6 C), temperature source Oral, resp. rate 18, height 6' (1.829 m), weight 70.262 kg (154 lb 14.4 oz), SpO2 99 %.  I/O:   Intake/Output Summary (Last 24 hours) at 05/08/15 1312 Last data filed at 05/08/15 1610  Gross per 24 hour  Intake      0 ml  Output   1000 ml  Net  -1000 ml    PHYSICAL EXAMINATION:  GENERAL:  44  y.o.-year-old patient lying in the bed with no acute distress.  EYES: Pupils equal, round, reactive to light and accommodation. No scleral icterus. Extraocular muscles intact.  HEENT: Head atraumatic, normocephalic. Oropharynx and nasopharynx clear.  NECK:  Supple, no jugular venous distention. No thyroid enlargement, no tenderness.  LUNGS: Normal breath sounds bilaterally, no wheezing, rales,rhonchi or crepitation. No use of accessory muscles of respiration.  CARDIOVASCULAR: S1, S2 normal. No murmurs, rubs, or gallops.  ABDOMEN: Soft, non-tender, non-distended. Bowel sounds present. No organomegaly or mass.  EXTREMITIES: No pedal edema, cyanosis, or clubbing.  -no crepitus on left foot. Linear non healing are over the left dorsal foot. No drainiage Good pedal pulses. No cyanosis NEUROLOGIC: Cranial nerves II through XII are intact. Muscle strength 5/5 in all extremities. Sensation intact. Gait not checked.  PSYCHIATRIC: The patient is alert and oriented x 3.  SKIN: No obvious rash, lesion, or ulcer.  DATA REVIEW:   CBC   Recent Labs Lab 05/08/15 0311  WBC 4.4  HGB 10.9*  HCT 31.1*  PLT 341    Chemistries   Recent Labs Lab 05/08/15 0311  NA 134*  K 4.0  CL 101  CO2 26  GLUCOSE 89  BUN 9  CREATININE 1.12  CALCIUM 8.8*  AST 20  ALT 13*  ALKPHOS 158*  BILITOT 0.5    Microbiology Results   Recent Results (from the past 240 hour(s))  Culture, blood (routine x 2)     Status: None (Preliminary result)   Collection Time: 05/08/15  3:11 AM  Result Value Ref Range Status   Specimen Description BLOOD BLOOD RIGHT FOREARM  Final   Special Requests BOTTLES DRAWN AEROBIC AND ANAEROBIC  Final   Culture NO GROWTH < 12 HOURS  Final   Report Status PENDING  Incomplete  Culture, blood (routine x 2)     Status: None (Preliminary result)   Collection Time: 05/08/15  3:15 AM  Result Value Ref Range Status   Specimen Description BLOOD BLOOD RIGHT FOREARM  Final   Special  Requests   Final    BOTTLES DRAWN AEROBIC AND ANAEROBIC 4MLAEROBIC,2MLANAEROBIC   Culture NO GROWTH < 12 HOURS  Final   Report Status PENDING  Incomplete    RADIOLOGY:  Dg Foot Complete Left  05/08/2015   CLINICAL DATA:  Pain and swelling in left foot with nonhealing ulcer. Increased drainage with nonhealing wound.  EXAM: LEFT FOOT - COMPLETE 3+ VIEW  COMPARISON:  None.  FINDINGS: Extensive subcutaneous air about the foot extending from the toes, dorsal and plantar foot, and distal lower extremity. No bony destructive change, erosion, periosteal reaction, fracture or dislocation. No radiopaque foreign body.  IMPRESSION: Extensive subcutaneous air about the entire foot and included lower leg. This is more than would be expected for ulcer, and raises concern for necrotizing soft tissue infection. No bony destructive change to suggest osteomyelitis.  These results were called by telephone at the time of interpretation on 05/08/2015 at 4:22 am to Dr. Manson Passey, who verbally acknowledged these results.   Electronically Signed   By: Rubye Oaks M.D.   On: 05/08/2015 04:23     Management plans discussed with the patient, family and they are in agreement.  CODE STATUS:     Code Status Orders        Start     Ordered   05/08/15 0818  Full code   Continuous     05/08/15 0817      TOTAL TIME TAKING CARE OF THIS PATIENT: 40 minutes.    PATEL,SONA M.D on 05/08/2015 at 1:12 PM  Between 7am to 6pm - Pager - [phone] After 6pm go to [email or web address] - password [email or web address] [facility]  [place] [facility]  Office  [phone]  CC: Primary care physician; No primary care Merrisa Skorupski on file.

## 2015-05-08 NOTE — Care Management Note (Signed)
Case Management Note  Patient Details  Name: [name] MRN: [id number] Date of Birth: 07-19-1971  Subjective/Objective:                  Met with patient to discuss discharge planning. He plans to return home with his wife at discharge. His PCP is with [facility] in [place]. He has received IV antibiotic in the past but not at home. He states he is not concerned about administering IV antibiotics in the home if necessary. He states he has a 2-wheeled walker but usually uses a cane. He states he uses [facility] for Rx. He denies a problem paying for medications.  Action/Plan: List of home health care agencies left with patient. RNCM will continue to follow.   Expected Discharge Date:  05/10/15               Expected Discharge Plan:     In-House Referral:     Discharge planning Services  CM Consult  Post Acute Care Choice:  Home Health, Durable Medical Equipment Choice offered to:  Patient  DME Arranged:    DME Agency:     HH Arranged:    [place] Agency:     Status of Service:  In process, will continue to follow  Medicare Important Message Given:    Date Medicare IM Given:    Medicare IM give by:    Date Additional Medicare IM Given:    Additional Medicare Important Message give by:     If discussed at [place] of Stay Meetings, dates discussed:    Additional Comments:  Marshell Garfinkel, RN 05/08/2015, 9:57 AM

## 2015-05-08 NOTE — Progress Notes (Signed)
Discharge instructions reviewed with the pt.  No rx for the pt.  I asked the pt to call me when he was ready for discharge.  He had informed me that he would drive himself home.  When I went to check on him he had already left

## 2015-05-08 NOTE — Consult Note (Signed)
Reason for Consult: Chronic wound on the dorsum of his left foot. Referring Physician: Dr. [name] is an 44 y.o. male.  HPI: This is a 44 year old male with a history of diabetes and peripheral vascular disease who had an infected ulceration that was originally debrided back in May down at [facility]. Subsequently over the last few months he has been seen by the wound care center as well as down at [facility] but complains that the wound continues to drain and will not heal. Recently presented to the emergency department with some nausea and fever and chills with some increased swelling and drainage from the left foot. Admitted for evaluation and IV antibiotics.  Past Medical History  Diagnosis Date  . Diabetes mellitus without complication   . Hypertension   . COPD (chronic obstructive pulmonary disease)   . Asthma   . Renal disorder   . Thrombocytopenia   . Myocardial infarction     Past Surgical History  Procedure Laterality Date  . Cholecystectomy    . Foot surgery Left     Family History  Problem Relation Age of Onset  . Adopted: Yes    Social History:  reports that he has been smoking Cigarettes.  He has been smoking about 1.00 pack per day. He does not have any smokeless tobacco history on file. He reports that he drinks alcohol. He reports that he does not use illicit drugs.  Allergies:  Allergies  Allergen Reactions  . Demerol [Meperidine] Anaphylaxis  . Erythromycin   . Morphine And Related Itching    Medications: I have reviewed the patient's current medications.  Results for orders placed or performed during the hospital encounter of 05/08/15 (from the past 48 hour(s))  CBC     Status: Abnormal   Collection Time: 05/08/15  3:11 AM  Result Value Ref Range   WBC 4.4 3.8 - 10.6 K/uL   RBC 3.29 (L) 4.40 - 5.90 MIL/uL   Hemoglobin 10.9 (L) 13.0 - 18.0 g/dL   HCT 31.1 (L) 40.0 - 52.0 %   MCV 94.5 80.0 - 100.0 fL   MCH 33.0 26.0 - 34.0 pg   MCHC 34.9 32.0 -  36.0 g/dL   RDW 16.5 (H) 11.5 - 14.5 %   Platelets 341 150 - 440 K/uL  Comprehensive metabolic panel     Status: Abnormal   Collection Time: 05/08/15  3:11 AM  Result Value Ref Range   Sodium 134 (L) 135 - 145 mmol/L   Potassium 4.0 3.5 - 5.1 mmol/L   Chloride 101 101 - 111 mmol/L   CO2 26 22 - 32 mmol/L   Glucose, Bld 89 65 - 99 mg/dL   BUN 9 6 - 20 mg/dL   Creatinine, Ser 1.12 0.61 - 1.24 mg/dL   Calcium 8.8 (L) 8.9 - 10.3 mg/dL   Total Protein 7.3 6.5 - 8.1 g/dL   Albumin 3.6 3.5 - 5.0 g/dL   AST 20 15 - 41 U/L   ALT 13 (L) 17 - 63 U/L   Alkaline Phosphatase 158 (H) 38 - 126 U/L   Total Bilirubin 0.5 0.3 - 1.2 mg/dL   GFR calc non Af Amer >60 >60 mL/min   GFR calc Af Amer >60 >60 mL/min    Comment: (NOTE) The eGFR has been calculated using the CKD EPI equation. This calculation has not been validated in all clinical situations. eGFR's persistently <60 mL/min signify possible Chronic Kidney Disease.    Anion gap 7 5 -  15  Culture, blood (routine x 2)     Status: None (Preliminary result)   Collection Time: 05/08/15  3:11 AM  Result Value Ref Range   Specimen Description BLOOD BLOOD RIGHT FOREARM    Special Requests BOTTLES DRAWN AEROBIC AND ANAEROBIC 6ML    Culture NO GROWTH < 12 HOURS    Report Status PENDING   Sedimentation rate     Status: Abnormal   Collection Time: 05/08/15  3:11 AM  Result Value Ref Range   Sed Rate 43 (H) 0 - 16 mm/hr  Culture, blood (routine x 2)     Status: None (Preliminary result)   Collection Time: 05/08/15  3:15 AM  Result Value Ref Range   Specimen Description BLOOD BLOOD RIGHT FOREARM    Special Requests      BOTTLES DRAWN AEROBIC AND ANAEROBIC 4MLAEROBIC,2MLANAEROBIC   Culture NO GROWTH < 12 HOURS    Report Status PENDING   Glucose, capillary     Status: None   Collection Time: 05/08/15 11:38 AM  Result Value Ref Range   Glucose-Capillary 96 65 - 99 mg/dL    Dg Foot Complete Left  05/08/2015   CLINICAL DATA:  Pain and swelling  in left foot with nonhealing ulcer. Increased drainage with nonhealing wound.  EXAM: LEFT FOOT - COMPLETE 3+ VIEW  COMPARISON:  None.  FINDINGS: Extensive subcutaneous air about the foot extending from the toes, dorsal and plantar foot, and distal lower extremity. No bony destructive change, erosion, periosteal reaction, fracture or dislocation. No radiopaque foreign body.  IMPRESSION: Extensive subcutaneous air about the entire foot and included lower leg. This is more than would be expected for ulcer, and raises concern for necrotizing soft tissue infection. No bony destructive change to suggest osteomyelitis.  These results were called by telephone at the time of interpretation on 05/08/2015 at 4:22 am to Dr. Owens Shark, who verbally acknowledged these results.   Electronically Signed   By: Jeb Levering M.D.   On: 05/08/2015 04:23    Review of Systems  Constitutional: Positive for fever and chills.  HENT: Negative.   Eyes: Negative.   Respiratory: Negative.   Cardiovascular: Negative.   Gastrointestinal: Positive for nausea.  Genitourinary: Negative.   Musculoskeletal: Negative.   Skin:       Some increased swelling and drainage from the chronic ulceration on his left foot  Neurological:       He does relate some numbness and paresthesias in the feet from his diabetes  Endo/Heme/Allergies: Negative.   Psychiatric/Behavioral: Negative.    Blood pressure 118/75, pulse 86, temperature 97.9 F (36.6 C), temperature source Oral, resp. rate 18, height 6' (1.829 m), weight 70.262 kg (154 lb 14.4 oz), SpO2 99 %. Physical Exam  Cardiovascular:  DP and PT pulses are fully palpable bilateral. Capillary filling time is intact.  Musculoskeletal:  Adequate range of motion of the pedal joints. Some pain on palpation of the dorsal midfoot at the ulceration site. No palpable crepitus is noted in the soft tissues.  Neurological:  Loss of protective threshold monofilament wire distally in the toes but  present in the forefoot plantar and dorsal.  Skin:  The skin is warm dry and somewhat atrophic. Diminished hair growth with some bilateral hyperpigmentation's. Chronic ulceration is noted on the dorsum of the left foot approximately 10 cm x 1 cm width. Granular base. No purulence could be expressed from the wound. No advancing cellulitis.    Assessment/Plan: Assessment: 1. Chronic ulceration left foot, currently managed  down at [facility]. 2. Diabetes with some degree of associated neuropathy.  Plan: The wound on the left foot was redressed. Discussed with the patient that it appears to be chronic and stable with no evidence of any acute abscess. Even though apparent gas was noted on x-rays taken during this visit these are chronic changes that have been noted on multiple other x-ray examinations over the past couple of months. Continue his local wound care and he will follow up as scheduled at a regularly scheduled appointment next week down at [facility]. 05/08/2015, 12:58 PM

## 2015-05-08 NOTE — Discharge Instructions (Signed)
Left foot dressing per instructions

## 2015-05-08 NOTE — ED Notes (Signed)
Patient presents with c/o LEFT foot pain, swelling, drainage, and bleeding that began earlier today. Of note, patient had debridement done at [facility] in May secondary to diabetic complications.

## 2015-05-08 NOTE — Progress Notes (Signed)
Pt is a smoker.  xarelto ordered bid-pt says he only takes it daily.  spiriva ordered daily-pt says he takes it twice a day. BP is low- 118/75.  MD making changes to orders.  Nicotrol ordered.  Pt is not to get coreg, imdur or tapentadol this am-

## 2015-05-08 NOTE — ED Provider Notes (Signed)
[facility] Emergency Department Provider Note  ____________________________________________  Time seen: Approximately 235 AM  I have reviewed the triage vital signs and the nursing notes.   HISTORY  Chief Complaint Foot Pain    HPI [name] is a 44 y.o. male who has a history of a nonhealing left foot wound. The patient reports that he had surgery on May 25 and has been in and out of the hospital since then. The patient reports that in the last 24 hours he's noticed some increased swelling, increased pain and drainage from the wound. The patient reports that he had been taking his oxycodone but it did not help with the pain. The patient also reports he took some Tylenol. The patient was last seen in July for general surgery at [facility]. The patient was concerned because he felt nauseated and had chills and he decided to come in for evaluation. The patient reports that he is unable to take NSAIDs at this time. The patient reports that he could not make it to [facility] due to lack of gas that he decided to come in here for evaluation instead.The patient reports that the pain is 6/10 in intensity. He reports that the surgeons told him to come to the Emergency room should he ever have increased pain or drainage.   Past Medical History  Diagnosis Date  . Diabetes mellitus without complication   . Hypertension   . COPD (chronic obstructive pulmonary disease)   . Asthma   . Renal disorder   . Thrombocytopenia   . Myocardial infarction     Patient Active Problem List   Diagnosis Date Noted  . Diabetic foot infection 05/08/2015  . DM (diabetes mellitus) 05/08/2015  . HTN (hypertension) 05/08/2015  . COPD (chronic obstructive pulmonary disease) 05/08/2015    Past Surgical History  Procedure Laterality Date  . Cholecystectomy    . Foot surgery Left     Current Outpatient Rx  Name  Route  Sig  Dispense  Refill  . acetaminophen (TYLENOL) 500 MG tablet    Oral   Take 1,000 mg by mouth every 6 (six) hours as needed.         Marland Kitchen albuterol (PROAIR HFA) 108 (90 BASE) MCG/ACT inhaler   Inhalation   Inhale 2 puffs into the lungs every 4 (four) hours as needed.         . carbamazepine (TEGRETOL) 200 MG tablet   Oral   Take 1 tablet by mouth daily.         . carvedilol (COREG) 12.5 MG tablet   Oral   Take 1 tablet by mouth 2 (two) times daily.         Marland Kitchen docusate sodium (COLACE) 100 MG capsule   Oral   Take 100 mg by mouth 2 (two) times daily.         Marland Kitchen gabapentin (NEURONTIN) 300 MG capsule   Oral   Take 2 capsules by mouth 3 (three) times daily.         . insulin aspart (NOVOLOG) 100 UNIT/ML injection   Subcutaneous   Inject 2 Units into the skin See admin instructions. Inject 2 units three times a day for every 20 mg/dl of blood glucose above /dl         . isosorbide mononitrate (IMDUR) 30 MG 24 hr tablet   Oral   Take 1 tablet by mouth 2 (two) times daily.         . Oxycodone HCl  10 MG TABS   Oral   Take 1 tablet by mouth every 8 (eight) hours as needed.         . pantoprazole (PROTONIX) 40 MG tablet   Oral   Take 40 mg by mouth 2 (two) times daily.         . pravastatin (PRAVACHOL) 20 MG tablet   Oral   Take 1 tablet by mouth at bedtime.         . predniSONE (DELTASONE) 20 MG tablet   Oral   Take 3 tablets by mouth daily.         . QUEtiapine (SEROQUEL) 400 MG tablet   Oral   Take 1 tablet by mouth at bedtime.         . rivaroxaban (XARELTO) 20 MG TABS tablet   Oral   Take 20 mg by mouth 2 (two) times daily.         . sucralfate (CARAFATE) 1 G tablet   Oral   Take 1 g by mouth daily.         . Tapentadol HCl (NUCYNTA ER) 200 MG TB12   Oral   Take 1 tablet by mouth every 12 (twelve) hours.         Marland Kitchen tiotropium (SPIRIVA) 18 MCG inhalation capsule   Inhalation   Place 1 capsule into inhaler and inhale daily.           Allergies Demerol; Erythromycin; and Morphine and  related  Family History  Problem Relation Age of Onset  . Adopted: Yes    Social History Social History  Substance Use Topics  . Smoking status: Current Every Day Smoker  . Smokeless tobacco: None  . Alcohol Use: Yes    Review of Systems Constitutional: No fever/chills Eyes: No visual changes. ENT: No sore throat. Cardiovascular: Denies chest pain. Respiratory: Denies shortness of breath. Gastrointestinal: Nausea with no abdominal pain. no vomiting.  No diarrhea.  No constipation. Genitourinary: Negative for dysuria. Musculoskeletal: Negative for back pain. Skin: Negative for rash. Neurological: Negative for headaches, focal weakness or numbness.  10-point ROS otherwise negative.  ____________________________________________   PHYSICAL EXAM:  VITAL SIGNS: ED Triage Vitals  Enc Vitals Group     BP 05/08/15 0114 116/71 mmHg     Pulse Rate 05/08/15 0114 89     Resp 05/08/15 0114 16     Temp 05/08/15 0114 98.1 F (36.7 C)     Temp Source 05/08/15 0114 Oral     SpO2 05/08/15 0114 100 %     Weight 05/08/15 0114 185 lb (83.915 kg)     Height 05/08/15 0114 6\' 1"  (1.854 m)     Head Cir --      Peak Flow --      Pain Score 05/08/15 0114 9     Pain Loc --      Pain Edu? --      Excl. in GC? --     Constitutional: Alert and oriented. Well appearing and in mild distress. Eyes: Conjunctivae are normal. PERRL. EOMI. Head: Atraumatic. Nose: No congestion/rhinnorhea. Mouth/Throat: Mucous membranes are moist.  Oropharynx non-erythematous. Cardiovascular: Normal rate, regular rhythm. Grossly normal heart sounds.  Good peripheral circulation. Respiratory: Normal respiratory effort.  No retractions. Lungs CTAB. Gastrointestinal: Soft and nontender. No distention. Positive bowel sounds Musculoskeletal: No lower extremity tenderness nor edema.   Neurologic:  Normal speech and language.  Skin:  Open wound to right dorsal foot, there is come swelling of the foot but  no draining  purulence or erythema to the wound Psychiatric: Mood and affect are normal.   ____________________________________________   LABS (all labs ordered are listed, but only abnormal results are displayed)  Labs Reviewed  CBC - Abnormal; Notable for the following:    RBC 3.29 (*)    Hemoglobin 10.9 (*)    HCT 31.1 (*)    RDW 16.5 (*)    All other components within normal limits  COMPREHENSIVE METABOLIC PANEL - Abnormal; Notable for the following:    Sodium 134 (*)    Calcium 8.8 (*)    ALT 13 (*)    Alkaline Phosphatase 158 (*)    All other components within normal limits  SEDIMENTATION RATE - Abnormal; Notable for the following:    Sed Rate 43 (*)    All other components within normal limits  CULTURE, BLOOD (ROUTINE X 2)  CULTURE, BLOOD (ROUTINE X 2)   ____________________________________________  EKG  none ____________________________________________  RADIOLOGY  Left foot xray: Extensive subcutaneous air about the entire foot and included lower leg, this is more than would be expected for ulcer and raises concern for necrotizing soft tissue infection, no bony structure did change to sit just osteomyelitis. ____________________________________________   PROCEDURES  Procedure(s) performed: None  Critical Care performed: Yes, see critical care note(s)   CRITICAL CARE Performed by: Lucrezia Europe P   Total critical care time: 30 min  Critical care time was exclusive of separately billable procedures and treating other patients.  Critical care was necessary to treat or prevent imminent or life-threatening deterioration.  Critical care was time spent personally by me on the following activities: development of treatment plan with patient and/or surrogate as well as nursing, discussions with consultants, evaluation of patient's response to treatment, examination of patient, obtaining history from patient or surrogate, ordering and performing treatments and  interventions, ordering and review of laboratory studies, ordering and review of radiographic studies, pulse oximetry and re-evaluation of patient's condition.   ____________________________________________   INITIAL IMPRESSION / ASSESSMENT AND PLAN / ED COURSE  Pertinent labs & imaging results that were available during my care of the patient were reviewed by me and considered in my medical decision making (see chart for details).  This is a 44 year old male with a history of chronic pain and nonhealing ulcer with reported drainage and increased swelling. The patient did receive some tramadol and Zofran for his pain and nausea initially. The patient's blood work was unremarkable aside from a sedimentation rate of 43. I will do an x-ray of the patient's foot and then reassess the patient afterwards.  After receiving the results of the x-ray the patient was given a dose of Zosyn and vancomycin. Prior to beginning the Zosyn and vancomycin the patient did have a low blood pressure in the 80s over 50s. The patient was also given a liter of normal saline. He did not get any fevers and was not altered in his mental status. After the normal saline and the Zosyn and vancomycin the patient's blood pressure improved to the 90s over 60s. The patient be admitted to the hospitalist service. I did contact surgery and they felt that the patient will be better served by podiatry. I inform the hospitalist service that they should inform podiatry of the patient's diabetic foot ulcer and possible necrotizing infection. ____________________________________________   FINAL CLINICAL IMPRESSION(S) / ED DIAGNOSES  Final diagnoses:  Diabetic foot infection      Rebecka Apley, MD 05/08/15 [phone]

## 2015-05-08 NOTE — H&P (Signed)
[facility] [place] at [facility]   PATIENT NAME: [name]    MR#:  [id number]  DATE OF BIRTH:  1970-11-07  DATE OF ADMISSION:  05/08/2015  PRIMARY CARE PHYSICIAN: No primary care provider on file. [facility]  REQUESTING/REFERRING PHYSICIAN: Dr. Zenda Alpers  CHIEF COMPLAINT:   Chief Complaint  Patient presents with  . Foot Pain   increased drainage, swelling and pain of left foot  HISTORY OF PRESENT ILLNESS:  [name]  is a 44 y.o. male with a known history of diabetes mellitus type 2, hypertension, COPD, coronary artery disease/MI, hyperlipidemia, TTP, left foot diabetic wound status post debridement in May 2016 presents to the emergency room with the complaints of increasing drainage, swelling and pain of left foot off 1-2 days' duration. Denies any fever, chills, chest pain, shortness of breath, nausea, vomiting, diarrhea, abdominal pain. Evaluation in the ED revealed borderline low blood pressure but otherwise stable vital signs and lab work with a WBC 4.4, H&H 10.9 over 31.1, platelets 341. Sedimentation rate elevated at 47. X-ray of the left foot showed extensive subcutaneous air involving entire foot and extending into the lower leg concerning for necrotizing soft tissue infection but no osteomyelitis. After obtaining blood cultures patient was started on broad spectrum antibiotics-vancomycin and Zosyn, was given normal saline bolus, was also given IV pain control medications and hospitalist service was consulted for further management. Patient at the current time is comfortably resting in the bed and states foot pain is under control.  PAST MEDICAL HISTORY:   Past Medical History  Diagnosis Date  . Diabetes mellitus without complication   . Hypertension   . COPD (chronic obstructive pulmonary disease)   . Asthma   . Renal disorder   . Thrombocytopenia   . Myocardial infarction     PAST SURGICAL HISTORY:   Past Surgical History   Procedure Laterality Date  . Cholecystectomy    . Foot surgery Left     SOCIAL HISTORY:   Social History  Substance Use Topics  . Smoking status: Current Every Day Smoker  . Smokeless tobacco: Not on file  . Alcohol Use: Yes    FAMILY HISTORY:   Family History  Problem Relation Age of Onset  . Adopted: Yes    DRUG ALLERGIES:   Allergies  Allergen Reactions  . Demerol [Meperidine]   . Erythromycin   . Morphine And Related     REVIEW OF SYSTEMS:   Review of Systems  Constitutional: Negative for fever, chills and malaise/fatigue.  HENT: Negative for ear pain, hearing loss, nosebleeds, sore throat and tinnitus.   Eyes: Negative for blurred vision, double vision, pain, discharge and redness.  Respiratory: Negative for cough, hemoptysis, sputum production, shortness of breath and wheezing.   Cardiovascular: Negative for chest pain, palpitations, orthopnea and leg swelling.  Gastrointestinal: Negative for nausea, vomiting, abdominal pain, diarrhea, constipation, blood in stool and melena.  Genitourinary: Negative for dysuria, urgency, frequency and hematuria.  Musculoskeletal: Negative for back pain and neck pain.       Left foot pain and swelling with increased drainage from the wound as noted in history of present illness.  Skin: Negative for itching and rash.       Increasing drainage from the left foot wound as noted in history of present illness  Neurological: Negative for dizziness, tingling, sensory change, focal weakness and seizures.  Endo/Heme/Allergies: Does not bruise/bleed easily.  Psychiatric/Behavioral: Negative for depression. The patient is not nervous/anxious.  MEDICATIONS AT HOME:   Prior to Admission medications   Medication Sig Start Date End Date Taking? Authorizing Provider  acetaminophen (TYLENOL) 500 MG tablet Take 1,000 mg by mouth every 6 (six) hours as needed. 05/09/12  Yes Historical Provider, MD  albuterol (PROAIR HFA) 108 (90 BASE)  MCG/ACT inhaler Inhale 2 puffs into the lungs every 4 (four) hours as needed. 08/20/14  Yes Historical Provider, MD  carbamazepine (TEGRETOL) 200 MG tablet Take 1 tablet by mouth daily.   Yes Historical Provider, MD  carvedilol (COREG) 12.5 MG tablet Take 1 tablet by mouth 2 (two) times daily.   Yes Historical Provider, MD  docusate sodium (COLACE) 100 MG capsule Take 100 mg by mouth 2 (two) times daily. 02/05/15  Yes Historical Provider, MD  gabapentin (NEURONTIN) 300 MG capsule Take 2 capsules by mouth 3 (three) times daily. 03/09/15 03/08/16 Yes Historical Provider, MD  insulin aspart (NOVOLOG) 100 UNIT/ML injection Inject 2 Units into the skin See admin instructions. Inject 2 units three times a day for every 20 mg/dl of blood glucose above 150mg /dl   Yes Historical Provider, MD  isosorbide mononitrate (IMDUR) 30 MG 24 hr tablet Take 1 tablet by mouth 2 (two) times daily.   Yes Historical Provider, MD  Oxycodone HCl 10 MG TABS Take 1 tablet by mouth every 8 (eight) hours as needed.   Yes Historical Provider, MD  pantoprazole (PROTONIX) 40 MG tablet Take 40 mg by mouth 2 (two) times daily. 10/30/14  Yes Historical Provider, MD  pravastatin (PRAVACHOL) 20 MG tablet Take 1 tablet by mouth at bedtime.   Yes Historical Provider, MD  predniSONE (DELTASONE) 20 MG tablet Take 3 tablets by mouth daily. 05/04/13  Yes Historical Provider, MD  QUEtiapine (SEROQUEL) 400 MG tablet Take 1 tablet by mouth at bedtime.   Yes Historical Provider, MD  rivaroxaban (XARELTO) 20 MG TABS tablet Take 20 mg by mouth 2 (two) times daily.   Yes Historical Provider, MD  sucralfate (CARAFATE) 1 G tablet Take 1 g by mouth daily. 04/28/10  Yes Historical Provider, MD  Tapentadol HCl (NUCYNTA ER) 200 MG TB12 Take 1 tablet by mouth every 12 (twelve) hours.   Yes Historical Provider, MD  tiotropium (SPIRIVA) 18 MCG inhalation capsule Place 1 capsule into inhaler and inhale daily.   Yes Historical Provider, MD      VITAL SIGNS:  Blood  pressure 95/57, pulse 77, temperature 98.1 F (36.7 C), temperature source Oral, resp. rate 16, height 6\' 1"  (1.854 m), weight 83.915 kg (185 lb), SpO2 98 %.  PHYSICAL EXAMINATION:  Physical Exam  Constitutional: He is oriented to person, place, and time. He appears well-developed and well-nourished. No distress.  HENT:  Head: Normocephalic and atraumatic.  Right Ear: External ear normal.  Left Ear: External ear normal.  Nose: Nose normal.  Mouth/Throat: Oropharynx is clear and moist. No oropharyngeal exudate.  Eyes: EOM are normal. Pupils are equal, round, and reactive to light. No scleral icterus.  Neck: Normal range of motion. Neck supple. No JVD present. No thyromegaly present.  Cardiovascular: Normal rate, regular rhythm, normal heart sounds and intact distal pulses.  Exam reveals no friction rub.   No murmur heard. Respiratory: Effort normal and breath sounds normal. No respiratory distress. He has no wheezes. He has no rales. He exhibits no tenderness.  GI: Soft. Bowel sounds are normal. He exhibits no distension and no mass. There is no tenderness. There is no rebound and no guarding.  Musculoskeletal: Normal range of motion.  He exhibits no edema.  Lymphadenopathy:    He has no cervical adenopathy.  Neurological: He is alert and oriented to person, place, and time. He has normal reflexes. He displays normal reflexes. No cranial nerve deficit. He exhibits normal muscle tone.  Skin: Skin is warm.  5 inches long superficial wound over dorsum of left foot with local drainage and surrounding erythema with local tenderness +  Psychiatric: He has a normal mood and affect. His behavior is normal. Thought content normal.   LABORATORY PANEL:   CBC  Recent Labs Lab 05/08/15 0311  WBC 4.4  HGB 10.9*  HCT 31.1*  PLT 341   ------------------------------------------------------------------------------------------------------------------  Chemistries   Recent Labs Lab  05/08/15 0311  NA 134*  K 4.0  CL 101  CO2 26  GLUCOSE 89  BUN 9  CREATININE 1.12  CALCIUM 8.8*  AST 20  ALT 13*  ALKPHOS 158*  BILITOT 0.5   ------------------------------------------------------------------------------------------------------------------  Cardiac Enzymes No results for input(s): TROPONINI in the last 168 hours. ------------------------------------------------------------------------------------------------------------------  RADIOLOGY:  Dg Foot Complete Left  05/08/2015   CLINICAL DATA:  Pain and swelling in left foot with nonhealing ulcer. Increased drainage with nonhealing wound.  EXAM: LEFT FOOT - COMPLETE 3+ VIEW  COMPARISON:  None.  FINDINGS: Extensive subcutaneous air about the foot extending from the toes, dorsal and plantar foot, and distal lower extremity. No bony destructive change, erosion, periosteal reaction, fracture or dislocation. No radiopaque foreign body.  IMPRESSION: Extensive subcutaneous air about the entire foot and included lower leg. This is more than would be expected for ulcer, and raises concern for necrotizing soft tissue infection. No bony destructive change to suggest osteomyelitis.  These results were called by telephone at the time of interpretation on 05/08/2015 at 4:22 am to Dr. Manson Passey, who verbally acknowledged these results.   Electronically Signed   By: Rubye Oaks M.D.   On: 05/08/2015 04:23    EKG:   Orders placed or performed during the hospital encounter of 03/27/15  . ED EKG  . ED EKG  . EKG    IMPRESSION AND PLAN:   44 year old male with history of diabetes mellitus type 2, hypertension, COPD, coronary artery disease/MI, history of DVT on Xarelto, left foot diabetic wound status post debridement in May 2016 presents with the complaints of increasing drainage with swelling and pain of left foot of one to 2 days' duration. 1. Left foot wound infection, increased local drainage with pain. X-ray foot with extensive  subcutaneous air involving entire foot and lower leg concerning for necrotizing soft tissue infection. Plan: Admit, IV antibiotics-vancomycin and Zosyn, pain control medications. Podiatry consultation and orthopedic consultation requested for further evaluation and management. 2. Diabetes mellitus type 2, on sliding scale insulin. Stable clinically. Continue same, follow blood sugars closely. 3. Hypertension, BP low normal now. Monitor blood pressure closely, follow-up accordingly. 4. COPD, stable on home medications. Continue same. 5. History of DVT, on Xarelto. Continue same. 6. Coronary artery disease/MI, stable. No acute problems.  7. Tobacco usage. Counseled to quit, patient not motivated.    All the records are reviewed and case discussed with ED provider. Management plans discussed with the patient and in agreement.  CODE STATUS: Full code  TOTAL TIME TAKING CARE OF THIS PATIENT: 50 minutes.    Jonnie Kind N M.D on 05/08/2015 at 7:14 AM  Between 7am to 6pm - Pager - [phone]  After 6pm go to [email or web address] - password [facility]  Office  [phone]  CC: Primary care physician; No primary care provider on file.

## 2015-05-13 LAB — CULTURE, BLOOD (ROUTINE X 2)
Culture: NO GROWTH
Culture: NO GROWTH

## 2015-07-26 ENCOUNTER — Emergency Department: Payer: Medicare Other

## 2015-07-26 ENCOUNTER — Emergency Department
Admission: EM | Admit: 2015-07-26 | Discharge: 2015-07-26 | Disposition: A | Discharge status: Home / Self Care | Payer: Medicare Other | Attending: Student | Admitting: Student

## 2015-07-26 DIAGNOSIS — Y998 Other external cause status: Secondary | ICD-10-CM | POA: Diagnosis not present

## 2015-07-26 DIAGNOSIS — S8992XA Unspecified injury of left lower leg, initial encounter: Secondary | ICD-10-CM | POA: Diagnosis present

## 2015-07-26 DIAGNOSIS — E11621 Type 2 diabetes mellitus with foot ulcer: Secondary | ICD-10-CM | POA: Insufficient documentation

## 2015-07-26 DIAGNOSIS — Z79899 Other long term (current) drug therapy: Secondary | ICD-10-CM | POA: Diagnosis not present

## 2015-07-26 DIAGNOSIS — Z89512 Acquired absence of left leg below knee: Secondary | ICD-10-CM | POA: Diagnosis not present

## 2015-07-26 DIAGNOSIS — Y9389 Activity, other specified: Secondary | ICD-10-CM | POA: Insufficient documentation

## 2015-07-26 DIAGNOSIS — Z794 Long term (current) use of insulin: Secondary | ICD-10-CM | POA: Diagnosis not present

## 2015-07-26 DIAGNOSIS — L97529 Non-pressure chronic ulcer of other part of left foot with unspecified severity: Secondary | ICD-10-CM | POA: Insufficient documentation

## 2015-07-26 DIAGNOSIS — W19XXXA Unspecified fall, initial encounter: Secondary | ICD-10-CM

## 2015-07-26 DIAGNOSIS — Z720 Tobacco use: Secondary | ICD-10-CM | POA: Insufficient documentation

## 2015-07-26 DIAGNOSIS — Y9289 Other specified places as the place of occurrence of the external cause: Secondary | ICD-10-CM | POA: Insufficient documentation

## 2015-07-26 DIAGNOSIS — Z7952 Long term (current) use of systemic steroids: Secondary | ICD-10-CM | POA: Diagnosis not present

## 2015-07-26 DIAGNOSIS — I10 Essential (primary) hypertension: Secondary | ICD-10-CM | POA: Insufficient documentation

## 2015-07-26 DIAGNOSIS — W010XXA Fall on same level from slipping, tripping and stumbling without subsequent striking against object, initial encounter: Secondary | ICD-10-CM | POA: Insufficient documentation

## 2015-07-26 DIAGNOSIS — M79605 Pain in left leg: Secondary | ICD-10-CM

## 2015-07-26 MED ORDER — HYDROMORPHONE HCL 1 MG/ML IJ SOLN
1.0000 mg | Freq: Once | INTRAMUSCULAR | Status: AC
Start: 2015-07-26 — End: 2015-07-26
  Administered 2015-07-26: 1 mg via INTRAMUSCULAR

## 2015-07-26 MED ORDER — HYDROMORPHONE HCL 1 MG/ML IJ SOLN
INTRAMUSCULAR | Status: AC
  Administered 2015-07-26: 1 mg via INTRAMUSCULAR
  Filled 2015-07-26: qty 1

## 2015-07-26 NOTE — ED Notes (Addendum)
Patient brought in via EMS from accident site.  Per Patient he lost balance when stepping backwards and tripped on curb while using crutches.  Patient walks with crutches and intermittently uses wheel chair due to amputation of left lower leg on 07/09/15.  Left BKA with staples still intact, bleeding controlled upon arrival. Patient denies hitting head.

## 2015-07-26 NOTE — ED Provider Notes (Signed)
[facility] Emergency Department Provider Note  ____________________________________________  Time seen: Approximately 1:00 PM  I have reviewed the triage vital signs and the nursing notes.   HISTORY  Chief Complaint Fall    HPI [name] is a 44 y.o. male history of diabetes, bipolar disorder, COPD, status post left BKA on 07/09/2015 for issues pertaining to diabetic foot ulcer presents for evaluation of sudden onset traumatic left leg/stump pain which began suddenly just prior to arrival when he suffered a mechanical fall, has been constant since onset and is worse with palpation and movement. Currently, he describes his pain as severe. The patient has been using crutches since his BKA. He lost balance when he was stepping backwards and tripped on a curb falling onto the left leg. He reports that it bled quite a bit from the areas that are still stapled. He did not hit his head or lose consciousness. He has no other pain complaints. He reports he has otherwise been recovering well. No chest pain, no difficulty breathing. No vomiting diarrhea fevers or chills. He reports he is no longer on Xarelto.   Past Medical History  Diagnosis Date  . Diabetes mellitus without complication (HCC)   . Hypertension   . COPD (chronic obstructive pulmonary disease) (HCC)   . Asthma   . Renal disorder   . Thrombocytopenia (HCC)   . Myocardial infarction [facility])     Patient Active Problem List   Diagnosis Date Noted  . Diabetic foot infection (HCC) 05/08/2015  . DM (diabetes mellitus) (HCC) 05/08/2015  . HTN (hypertension) 05/08/2015  . COPD (chronic obstructive pulmonary disease) (HCC) 05/08/2015    Past Surgical History  Procedure Laterality Date  . Cholecystectomy    . Foot surgery Left     Current Outpatient Rx  Name  Route  Sig  Dispense  Refill  . acetaminophen (TYLENOL) 500 MG tablet   Oral   Take 1,000 mg by mouth every 6 (six) hours as needed.          Marland Kitchen albuterol (PROAIR HFA) 108 (90 BASE) MCG/ACT inhaler   Inhalation   Inhale 2 puffs into the lungs every 4 (four) hours as needed.         . carbamazepine (TEGRETOL) 200 MG tablet   Oral   Take 1 tablet by mouth daily.         . carvedilol (COREG) 12.5 MG tablet   Oral   Take 1 tablet by mouth 2 (two) times daily.         Marland Kitchen docusate sodium (COLACE) 100 MG capsule   Oral   Take 100 mg by mouth 2 (two) times daily.         Marland Kitchen gabapentin (NEURONTIN) 300 MG capsule   Oral   Take 2 capsules by mouth 3 (three) times daily.         . insulin aspart (NOVOLOG) 100 UNIT/ML injection   Subcutaneous   Inject 2 Units into the skin See admin instructions. Inject 2 units three times a day for every 20 mg/dl of blood glucose above /dl         . isosorbide mononitrate (IMDUR) 30 MG 24 hr tablet   Oral   Take 1 tablet by mouth 2 (two) times daily.         . Oxycodone HCl 10 MG TABS   Oral   Take 1 tablet by mouth every 8 (eight) hours as needed.         Marland Kitchen  pantoprazole (PROTONIX) 40 MG tablet   Oral   Take 40 mg by mouth 2 (two) times daily.         . pravastatin (PRAVACHOL) 20 MG tablet   Oral   Take 1 tablet by mouth at bedtime.         . predniSONE (DELTASONE) 20 MG tablet   Oral   Take 3 tablets by mouth daily.         . QUEtiapine (SEROQUEL) 400 MG tablet   Oral   Take 1 tablet by mouth at bedtime.         . rivaroxaban (XARELTO) 20 MG TABS tablet   Oral   Take 20 mg by mouth daily.          . sucralfate (CARAFATE) 1 G tablet   Oral   Take 1 g by mouth daily.         . Tapentadol HCl Marland Kitchen(NUCYNTA ER) 200 MG TB12   Oral   Take 1 tablet by mouth every 12 (twelve) hours.         . tiotropium (SPIRIVA) 18 MCG inhalation capsule   Inhalation   Place 1 capsule into inhaler and inhale 2 (two) times daily.            Allergies Demerol; Erythromycin; and Morphine and related  Family History  Problem Relation Age of Onset  .  Adopted: Yes    Social History Social History  Substance Use Topics  . Smoking status: Current Every Day Smoker -- 1.00 packs/day    Types: Cigarettes  . Smokeless tobacco: None  . Alcohol Use: Yes    Review of Systems Constitutional: No fever/chills Eyes: No visual changes. ENT: No sore throat. Cardiovascular: Denies chest pain. Respiratory: Denies shortness of breath. Gastrointestinal: No abdominal pain.  No nausea, no vomiting.  No diarrhea.  No constipation. Genitourinary: Negative for dysuria. Musculoskeletal: Negative for back pain. Skin: Negative for rash. Neurological: Negative for headaches, focal weakness or numbness.  10-point ROS otherwise negative.  ____________________________________________   PHYSICAL EXAM:  VITAL SIGNS: ED Triage Vitals  Enc Vitals Group     BP 07/26/15 1238 124/83 mmHg     Pulse Rate 07/26/15 1238 80     Resp 07/26/15 1238 16     Temp 07/26/15 1238 98.7 F (37.1 C)     Temp Source 07/26/15 1238 Oral     SpO2 07/26/15 1238 100 %     Weight 07/26/15 1238 170 lb (77.111 kg)     Height 07/26/15 1238  (1.854 m)     Head Cir --      Peak Flow --      Pain Score 07/26/15 1238 9     Pain Loc --      Pain Edu? --      Excl. in GC? --     Constitutional: Alert and oriented. Well appearing and in no acute distress at rest but does appear to be in pain when he moves the leg. Eyes: Conjunctivae are normal. PERRL. EOMI. Head: Atraumatic. Nose: No congestion/rhinnorhea. Mouth/Throat: Mucous membranes are moist.  Oropharynx non-erythematous. Neck: No stridor. No midline C-spine tenderness to palpation. Cardiovascular: Normal rate, regular rhythm. Grossly normal heart sounds.  Good peripheral circulation. Respiratory: Normal respiratory effort.  No retractions. Lungs CTAB. Gastrointestinal: Soft and nontender. No distention. No abdominal bruits. No CVA tenderness. Genitourinary: deferred Musculoskeletal: Status post left below-knee  amputation, incision is well healing and closed with staples, there is no bleeding, the  stump is warm and well-perfused though he does have some moderate tenderness in the distal aspect of the leg. Neurologic:  Normal speech and language. No gross focal neurologic deficits are appreciated. Skin:  Skin is warm, dry and intact. No rash noted. Psychiatric: Mood and affect are normal. Speech and behavior are normal.  ____________________________________________   LABS (all labs ordered are listed, but only abnormal results are displayed)  Labs Reviewed - No data to display ____________________________________________  EKG  none ____________________________________________  RADIOLOGY  Xray left knee IMPRESSION: Prior BKA. No acute abnormality. No evidence of fracture or dislocation .  ____________________________________________   PROCEDURES  Procedure(s) performed: None  Critical Care performed: No  ____________________________________________   INITIAL IMPRESSION / ASSESSMENT AND PLAN / ED COURSE  Pertinent labs & imaging results that were available during my care of the patient were reviewed by me and considered in my medical decision making (see chart for details).  Lynnae JanuaryStephen G Ruscitti is a 44 y.o. male history of diabetes, bipolar disorder, COPD, status post left BKA on 07/09/2015 for issues pertaining to diabetic foot ulcer presents for evaluation of sudden onset traumatic left leg/stump pain which began suddenly just prior to arrival when he suffered a mechanical fall. On exam, he is nontoxic appearing and generally in no acute distress unless he moves the left leg. Head is atraumatic. His exam is otherwise benign. He has full range of motion at the left knee. There is no excessive swelling associated with the BKA stump, the incision is healing well with staples, there is no bleeding. He is no longer anticoagulated. His vital signs are stable, his x-ray shows no acute  abnormality. His pain has improved with a dose of IM Dilaudid. Discussed return precautions, need for close follow-up with his surgeon. Specifically, he is requesting narcotic pain medications for discharge. I told him that we will not prescribe any narcotics from the ER and that he needs to follow-up with his primary care doctor and/or his surgeon for this. He voices understanding. DC with return precautions. ____________________________________________   FINAL CLINICAL IMPRESSION(S) / ED DIAGNOSES  Final diagnoses:  Fall, initial encounter  Left leg pain      Gayla DossEryka A Robie Mcniel, MD 07/26/15 2148

## 2015-08-19 ENCOUNTER — Encounter: Payer: Self-pay | Admitting: Emergency Medicine

## 2015-08-19 ENCOUNTER — Emergency Department
Admission: EM | Admit: 2015-08-19 | Discharge: 2015-08-19 | Disposition: A | Discharge status: Home / Self Care | Payer: Medicare Other | Attending: Emergency Medicine | Admitting: Emergency Medicine

## 2015-08-19 ENCOUNTER — Emergency Department: Payer: Medicare Other

## 2015-08-19 DIAGNOSIS — F17210 Nicotine dependence, cigarettes, uncomplicated: Secondary | ICD-10-CM | POA: Diagnosis not present

## 2015-08-19 DIAGNOSIS — R1013 Epigastric pain: Secondary | ICD-10-CM | POA: Insufficient documentation

## 2015-08-19 DIAGNOSIS — Z7901 Long term (current) use of anticoagulants: Secondary | ICD-10-CM | POA: Insufficient documentation

## 2015-08-19 DIAGNOSIS — F329 Major depressive disorder, single episode, unspecified: Secondary | ICD-10-CM | POA: Diagnosis not present

## 2015-08-19 DIAGNOSIS — Z7951 Long term (current) use of inhaled steroids: Secondary | ICD-10-CM | POA: Diagnosis not present

## 2015-08-19 DIAGNOSIS — Z7952 Long term (current) use of systemic steroids: Secondary | ICD-10-CM | POA: Diagnosis not present

## 2015-08-19 DIAGNOSIS — Z794 Long term (current) use of insulin: Secondary | ICD-10-CM | POA: Insufficient documentation

## 2015-08-19 DIAGNOSIS — E119 Type 2 diabetes mellitus without complications: Secondary | ICD-10-CM | POA: Diagnosis not present

## 2015-08-19 DIAGNOSIS — Z79891 Long term (current) use of opiate analgesic: Secondary | ICD-10-CM | POA: Diagnosis not present

## 2015-08-19 DIAGNOSIS — R112 Nausea with vomiting, unspecified: Secondary | ICD-10-CM

## 2015-08-19 DIAGNOSIS — Z89512 Acquired absence of left leg below knee: Secondary | ICD-10-CM | POA: Diagnosis not present

## 2015-08-19 DIAGNOSIS — Z79899 Other long term (current) drug therapy: Secondary | ICD-10-CM | POA: Insufficient documentation

## 2015-08-19 DIAGNOSIS — I10 Essential (primary) hypertension: Secondary | ICD-10-CM | POA: Diagnosis not present

## 2015-08-19 DIAGNOSIS — G8929 Other chronic pain: Secondary | ICD-10-CM | POA: Diagnosis not present

## 2015-08-19 DIAGNOSIS — R109 Unspecified abdominal pain: Secondary | ICD-10-CM | POA: Diagnosis not present

## 2015-08-19 DIAGNOSIS — R079 Chest pain, unspecified: Secondary | ICD-10-CM | POA: Diagnosis not present

## 2015-08-19 HISTORY — DX: Gastric ulcer, unspecified as acute or chronic, without hemorrhage or perforation: K25.9

## 2015-08-19 LAB — BASIC METABOLIC PANEL
Anion gap: 10 (ref 5–15)
BUN: 17 mg/dL (ref 6–20)
CHLORIDE: 99 mmol/L — AB (ref 101–111)
CO2: 29 mmol/L (ref 22–32)
Calcium: 10.3 mg/dL (ref 8.9–10.3)
Creatinine, Ser: 1.11 mg/dL (ref 0.61–1.24)
GFR calc Af Amer: >60 mL/min (ref >60)
GFR calc non Af Amer: >60 mL/min (ref >60)
GLUCOSE: 147 mg/dL — AB (ref 65–99)
POTASSIUM: 3.7 mmol/L (ref 3.5–5.1)
Sodium: 138 mmol/L (ref 135–145)

## 2015-08-19 LAB — HEPATIC FUNCTION PANEL
ALBUMIN: 4.3 g/dL (ref 3.5–5.0)
ALK PHOS: 156 U/L — AB (ref 38–126)
ALT: 13 U/L — ABNORMAL LOW (ref 17–63)
AST: 18 U/L (ref 15–41)
BILIRUBIN TOTAL: 0.5 mg/dL (ref 0.3–1.2)
Total Protein: 9.8 g/dL — ABNORMAL HIGH (ref 6.5–8.1)

## 2015-08-19 LAB — TROPONIN I: Troponin I: <0.03 ng/mL (ref <0.031)

## 2015-08-19 LAB — CBC
HEMATOCRIT: 37.3 % — AB (ref 40.0–52.0)
Hemoglobin: 12.9 g/dL — ABNORMAL LOW (ref 13.0–18.0)
MCH: 31.6 pg (ref 26.0–34.0)
MCHC: 34.5 g/dL (ref 32.0–36.0)
MCV: 91.5 fL (ref 80.0–100.0)
Platelets: 479 10*3/uL — ABNORMAL HIGH (ref 150–440)
RBC: 4.07 MIL/uL — ABNORMAL LOW (ref 4.40–5.90)
RDW: 16.5 % — AB (ref 11.5–14.5)
WBC: 6.7 10*3/uL (ref 3.8–10.6)

## 2015-08-19 LAB — TYPE AND SCREEN
ABO/RH(D): O POS
Antibody Screen: NEGATIVE

## 2015-08-19 LAB — ETHANOL

## 2015-08-19 LAB — PROTIME-INR
INR: 1
PROTHROMBIN TIME: 13.4 s (ref 11.4–15.0)

## 2015-08-19 LAB — LIPASE, BLOOD: LIPASE: 22 U/L (ref 11–51)

## 2015-08-19 LAB — ABO/RH: ABO/RH(D): O POS

## 2015-08-19 MED ORDER — PROMETHAZINE HCL 25 MG/ML IJ SOLN
INTRAMUSCULAR | Status: AC
  Filled 2015-08-19: qty 1

## 2015-08-19 MED ORDER — HYDROMORPHONE HCL 1 MG/ML IJ SOLN
1.0000 mg | Freq: Once | INTRAMUSCULAR | Status: AC
  Administered 2015-08-19: 1 mg via INTRAVENOUS

## 2015-08-19 MED ORDER — PROMETHAZINE HCL 25 MG/ML IJ SOLN
25.0000 mg | Freq: Once | INTRAMUSCULAR | Status: AC
  Administered 2015-08-19: 25 mg via INTRAVENOUS

## 2015-08-19 MED ORDER — HYDROMORPHONE HCL 1 MG/ML IJ SOLN
INTRAMUSCULAR | Status: AC
  Filled 2015-08-19: qty 1

## 2015-08-19 MED ORDER — ONDANSETRON HCL 4 MG/2ML IJ SOLN
4.0000 mg | Freq: Once | INTRAMUSCULAR | Status: AC
  Administered 2015-08-19: 4 mg via INTRAVENOUS

## 2015-08-19 MED ORDER — SODIUM CHLORIDE 0.9 % IV BOLUS (SEPSIS)
1000.0000 mL | Freq: Once | INTRAVENOUS | Status: AC
  Administered 2015-08-19: 1000 mL via INTRAVENOUS

## 2015-08-19 MED ORDER — PROMETHAZINE HCL 25 MG PO TABS: 25.0000 mg | ORAL_TABLET | Freq: Four times a day (QID) | ORAL | Status: DC | PRN

## 2015-08-19 MED ORDER — PANTOPRAZOLE SODIUM 40 MG IV SOLR
40.0000 mg | Freq: Once | INTRAVENOUS | Status: AC
  Administered 2015-08-19: 40 mg via INTRAVENOUS
  Filled 2015-08-19: qty 40

## 2015-08-19 MED ORDER — ONDANSETRON HCL 4 MG/2ML IJ SOLN
INTRAMUSCULAR | Status: AC
  Filled 2015-08-19: qty 2

## 2015-08-19 NOTE — ED Notes (Signed)
Pt requesting additional pain medication and nausea medication. Dr. Dolores FrameSung notified.

## 2015-08-19 NOTE — ED Notes (Signed)
Pt states began to experience vomiting at noon yesterday. Pt states has had 6 episodes since vomiting began. Pt states began to experience chest pain after vomiting. Pt denies diarrhea. Pt states last bowel movement 2 days pta. Pt states emesis is bile and coffee ground.

## 2015-08-19 NOTE — ED Notes (Signed)
Report to tricia, rn.

## 2015-08-19 NOTE — ED Notes (Signed)
Pt called cab to come get him and take him home.  Pt taken to lobby to wait for cab.

## 2015-08-19 NOTE — ED Provider Notes (Signed)
Labs Reviewed  BASIC METABOLIC PANEL - Abnormal; Notable for the following:    Chloride 99 (*)    Glucose, Bld 147 (*)    All other components within normal limits  CBC - Abnormal; Notable for the following:    RBC 4.07 (*)    Hemoglobin 12.9 (*)    HCT 37.3 (*)    RDW 16.5 (*)    Platelets 479 (*)    All other components within normal limits  HEPATIC FUNCTION PANEL - Abnormal; Notable for the following:    Total Protein 9.8 (*)    ALT 13 (*)    Alkaline Phosphatase 156 (*)    Bilirubin, Direct <0.1 (*)    All other components within normal limits  TROPONIN I  LIPASE, BLOOD  ETHANOL  PROTIME-INR  URINE DRUG SCREEN, QUALITATIVE ([facility] ONLY)  TYPE AND SCREEN  ABO/RH   Patient is in no acute distress, he'll be discharged with antiemetics and encouraged to have follow-up with his doctor.   Emily FilbertJonathan E Letcher Schweikert, MD 08/19/15 [phone]

## 2015-08-19 NOTE — ED Notes (Signed)
Pt with below the knee left sided amputation. Pt moving all extremities without difficulty.

## 2015-08-19 NOTE — ED Provider Notes (Signed)
[facility] Emergency Department Provider Note  ____________________________________________  Time seen: Approximately 5:46 AM  I have reviewed the triage vital signs and the nursing notes.   HISTORY  Chief Complaint Emesis and Chest Pain    HPI [name] is a 44 y.o. male who presents to the ED from home via EMS for nausea, vomiting and chronic amputation pain. Patient has a history of diabetes, COPD, bipolardisorder, most recently status post left BKA in October who reports onset of nausea and vomiting since noon yesterday. Patient has experienced 6 episodes since and states he is unable to tolerate anything by mouth, including his pain pills. Patient denies associated symptoms of fever, chills, shortness of breath, dysuria, diarrhea. Reports last bowel movement was approximately 2 days ago. States he is vomiting coffee ground emesis. Denies recent travel. Nothing makes his symptoms better or worse.   Past Medical History  Diagnosis Date  . Diabetes mellitus without complication (HCC)   . Hypertension   . COPD (chronic obstructive pulmonary disease) (HCC)   . Asthma   . Renal disorder   . Thrombocytopenia (HCC)   . Myocardial infarction (HCC)   . Gastric ulcer     Patient Active Problem List   Diagnosis Date Noted  . Diabetic foot infection (HCC) 05/08/2015  . DM (diabetes mellitus) (HCC) 05/08/2015  . HTN (hypertension) 05/08/2015  . COPD (chronic obstructive pulmonary disease) (HCC) 05/08/2015    Past Surgical History  Procedure Laterality Date  . Cholecystectomy    . Foot surgery Left   . Left below knee amputation      Current Outpatient Rx  Name  Route  Sig  Dispense  Refill  . acetaminophen (TYLENOL) 500 MG tablet   Oral   Take 1,000 mg by mouth every 6 (six) hours as needed.         Marland Kitchen. albuterol (PROAIR HFA) 108 (90 BASE) MCG/ACT inhaler   Inhalation   Inhale 2 puffs into the lungs every 4 (four) hours as needed.          . carbamazepine (TEGRETOL) 200 MG tablet   Oral   Take 1 tablet by mouth daily.         . carvedilol (COREG) 12.5 MG tablet   Oral   Take 1 tablet by mouth 2 (two) times daily.         Marland Kitchen. docusate sodium (COLACE) 100 MG capsule   Oral   Take 100 mg by mouth 2 (two) times daily.         Marland Kitchen. gabapentin (NEURONTIN) 300 MG capsule   Oral   Take 2 capsules by mouth 3 (three) times daily.         . insulin aspart (NOVOLOG) 100 UNIT/ML injection   Subcutaneous   Inject 2 Units into the skin See admin instructions. Inject 2 units three times a day for every 20 mg/dl of blood glucose above 150mg /dl         . isosorbide mononitrate (IMDUR) 30 MG 24 hr tablet   Oral   Take 1 tablet by mouth 2 (two) times daily.         . Oxycodone HCl 10 MG TABS   Oral   Take 1 tablet by mouth every 8 (eight) hours as needed.         . pantoprazole (PROTONIX) 40 MG tablet   Oral   Take 40 mg by mouth 2 (two) times daily.         .Marland Kitchen  pravastatin (PRAVACHOL) 20 MG tablet   Oral   Take 1 tablet by mouth at bedtime.         . predniSONE (DELTASONE) 20 MG tablet   Oral   Take 3 tablets by mouth daily.         . QUEtiapine (SEROQUEL) 400 MG tablet   Oral   Take 1 tablet by mouth at bedtime.         . rivaroxaban (XARELTO) 20 MG TABS tablet   Oral   Take 20 mg by mouth daily.          . sucralfate (CARAFATE) 1 G tablet   Oral   Take 1 g by mouth daily.         . Tapentadol HCl (NUCYNTA ER) 200 MG TB12   Oral   Take 1 tablet by mouth every 12 (twelve) hours.         Marland Kitchen tiotropium (SPIRIVA) 18 MCG inhalation capsule   Inhalation   Place 1 capsule into inhaler and inhale 2 (two) times daily.            Allergies Demerol; Erythromycin; and Morphine and related  Family History  Problem Relation Age of Onset  . Adopted: Yes    Social History Social History  Substance Use Topics  . Smoking status: Current Every Day Smoker -- 1.00 packs/day    Types:  Cigarettes  . Smokeless tobacco: Never Used  . Alcohol Use: No    Review of Systems Constitutional: No fever/chills Eyes: No visual changes. ENT: No sore throat. Cardiovascular: Denies chest pain. Respiratory: Denies shortness of breath. Gastrointestinal: Positive for abdominal pain.  Positive for nausea and vomiting.  No diarrhea.  No constipation. Genitourinary: Negative for dysuria. Musculoskeletal: Negative for back pain. Skin: Negative for rash. Neurological: Negative for headaches, focal weakness or numbness. Psychiatric:Positive for depression without active SI/HI/AH/VH.  10-point ROS otherwise negative.  ____________________________________________   PHYSICAL EXAM:  VITAL SIGNS: ED Triage Vitals  Enc Vitals Group     BP 08/19/15 0524 161/103 mmHg     Pulse Rate 08/19/15 0524 84     Resp 08/19/15 0524 14     Temp 08/19/15 0524 98.3 F (36.8 C)     Temp Source 08/19/15 0524 Oral     SpO2 08/19/15 0524 100 %     Weight 08/19/15 0524 160 lb (72.576 kg)     Height 08/19/15 0524  (1.854 m)     Head Cir --      Peak Flow --      Pain Score 08/19/15 0525 9     Pain Loc --      Pain Edu? --      Excl. in GC? --     Constitutional: Alert and oriented. Well appearing and in no acute distress. Eyes: Conjunctivae are normal. PERRL. EOMI. Head: Atraumatic. Nose: No congestion/rhinnorhea. Mouth/Throat: Mucous membranes are moist.  Oropharynx non-erythematous. Neck: No stridor.   Cardiovascular: Normal rate, regular rhythm. Grossly normal heart sounds.  Good peripheral circulation. Respiratory: Normal respiratory effort.  No retractions. Lungs CTAB. Gastrointestinal: Soft and mildly tender to palpation to epigastrium without rebound or guarding. No distention. No abdominal bruits. No CVA tenderness. Musculoskeletal: Left BKA. Incision C/D/I. Neurologic:  Normal speech and language. No gross focal neurologic deficits are appreciated.  Skin:  Skin is warm, dry and  intact. No rash noted. Psychiatric: Mood and affect are normal. Speech and behavior are normal.  ____________________________________________   LABS (all labs ordered  are listed, but only abnormal results are displayed)  Labs Reviewed  CBC - Abnormal; Notable for the following:    RBC 4.07 (*)    Hemoglobin 12.9 (*)    HCT 37.3 (*)    RDW 16.5 (*)    Platelets 479 (*)    All other components within normal limits  ETHANOL  PROTIME-INR  BASIC METABOLIC PANEL  TROPONIN I  HEPATIC FUNCTION PANEL  LIPASE, BLOOD  URINE DRUG SCREEN, QUALITATIVE ([facility] ONLY)  TYPE AND SCREEN   ____________________________________________  EKG  ED ECG REPORT I, SUNG,JADE J, the attending physician, personally viewed and interpreted this ECG.   Date: 08/19/2015  EKG Time: 0524  Rate: 96  Rhythm: normal EKG, normal sinus rhythm  Axis: Normal  Intervals:none  ST&T Change: Nonspecific  ____________________________________________  RADIOLOGY  Portable chest x-ray (viewed by me, interpreted per Dr. Grace Isaac): No active disease. ____________________________________________   PROCEDURES  Procedure(s) performed:   Rectal exam: External exam WNL. Tan stool on gloved finger which was guiac negative.  Critical Care performed: No  ____________________________________________   INITIAL IMPRESSION / ASSESSMENT AND PLAN / ED COURSE  Pertinent labs & imaging results that were available during my care of the patient were reviewed by me and considered in my medical decision making (see chart for details).  44 year old male who presents with nausea, vomiting, epigastric discomfort and exacerbation of chronic pain. Will check screening labwork, CXR, IV fluids, IV analgesic/antiemetic and reassess.  ----------------------------------------- 7:01 AM on 08/19/2015 -----------------------------------------  Laboratory pending. Phenergan administered for patient's complaint of persistent nausea. Care  transferred to Dr. Mayford Knife pending lab results and reassessment. ____________________________________________   FINAL CLINICAL IMPRESSION(S) / ED DIAGNOSES  Final diagnoses:  Non-intractable vomiting with nausea, vomiting of unspecified type  Chronic pain      Irean Hong, MD 08/19/15 [phone]

## 2015-08-19 NOTE — Discharge Instructions (Signed)
Chronic Pain  Chronic pain can be defined as pain that is off and on and lasts for 3-6 months or longer. Many things cause chronic pain, which can make it difficult to make a diagnosis. There are many treatment options available for chronic pain. However, finding a treatment that works well for you may require trying various approaches until the right one is found. Many people benefit from a combination of two or more types of treatment to control their pain.  SYMPTOMS   Chronic pain can occur anywhere in the body and can range from mild to very severe. Some types of chronic pain include:   Headache.   Low back pain.   Cancer pain.   Arthritis pain.   Neurogenic pain. This is pain resulting from damage to nerves.  People with chronic pain may also have other symptoms such as:   Depression.   Anger.   Insomnia.   Anxiety.  DIAGNOSIS   Your health care provider will help diagnose your condition over time. In many cases, the initial focus will be on excluding possible conditions that could be causing the pain. Depending on your symptoms, your health care provider may order tests to diagnose your condition. Some of these tests may include:    Blood tests.    CT scan.    MRI.    X-rays.    Ultrasounds.    Nerve conduction studies.   You may need to see a specialist.   TREATMENT   Finding treatment that works well may take time. You may be referred to a pain specialist. He or she may prescribe medicine or therapies, such as:    Mindful meditation or yoga.   Shots (injections) of numbing or pain-relieving medicines into the spine or area of pain.   Local electrical stimulation.   Acupuncture.    Massage therapy.    Aroma, color, light, or sound therapy.    Biofeedback.    Working with a physical therapist to keep from getting stiff.    Regular, gentle exercise.    Cognitive or behavioral therapy.    Group support.   Sometimes, surgery may be recommended.   HOME CARE INSTRUCTIONS     Take all medicines as directed by your health care provider.    Lessen stress in your life by relaxing and doing things such as listening to calming music.    Exercise or be active as directed by your health care provider.    Eat a healthy diet and include things such as vegetables, fruits, fish, and lean meats in your diet.    Keep all follow-up appointments with your health care provider.    Attend a support group with others suffering from chronic pain.  SEEK MEDICAL CARE IF:    Your pain gets worse.    You develop a new pain that was not there before.    You cannot tolerate medicines given to you by your health care provider.    You have new symptoms since your last visit with your health care provider.   SEEK IMMEDIATE MEDICAL CARE IF:    You feel weak.    You have decreased sensation or numbness.    You lose control of bowel or bladder function.    Your pain suddenly gets much worse.    You develop shaking.   You develop chills.   You develop confusion.   You develop chest pain.   You develop shortness of breath.   MAKE SURE YOU:     Understand these instructions.   Will watch your condition.   Will get help right away if you are not doing well or get worse.     This information is not intended to replace advice given to you by your health care provider. Make sure you discuss any questions you have with your health care provider.     Document Released: 05/23/2002 Document Revised: 05/03/2013 Document Reviewed: 02/24/2013  Elsevier Interactive Patient Education 2016 [employer]    Nausea and Vomiting  Nausea is a sick feeling that often comes before throwing up (vomiting). Vomiting is a reflex where stomach contents come out of your mouth. Vomiting can cause severe loss of body fluids (dehydration). Children and elderly adults can become dehydrated quickly, especially if they also have diarrhea. Nausea and vomiting are symptoms of a condition or disease. It is important  to find the cause of your symptoms.  CAUSES    Direct irritation of the stomach lining. This irritation can result from increased acid production (gastroesophageal reflux disease), infection, food poisoning, taking certain medicines (such as nonsteroidal anti-inflammatory drugs), alcohol use, or tobacco use.   Signals from the brain.These signals could be caused by a headache, heat exposure, an inner ear disturbance, increased pressure in the brain from injury, infection, a tumor, or a concussion, pain, emotional stimulus, or metabolic problems.   An obstruction in the gastrointestinal tract (bowel obstruction).   Illnesses such as diabetes, hepatitis, gallbladder problems, appendicitis, kidney problems, cancer, sepsis, atypical symptoms of a heart attack, or eating disorders.   Medical treatments such as chemotherapy and radiation.   Receiving medicine that makes you sleep (general anesthetic) during surgery.  DIAGNOSIS  Your caregiver may ask for tests to be done if the problems do not improve after a few days. Tests may also be done if symptoms are severe or if the reason for the nausea and vomiting is not clear. Tests may include:   Urine tests.   Blood tests.   Stool tests.   Cultures (to look for evidence of infection).   X-rays or other imaging studies.  Test results can help your caregiver make decisions about treatment or the need for additional tests.  TREATMENT  You need to stay well hydrated. Drink frequently but in small amounts.You may wish to drink water, sports drinks, clear broth, or eat frozen ice pops or gelatin dessert to help stay hydrated.When you eat, eating slowly may help prevent nausea.There are also some antinausea medicines that may help prevent nausea.  HOME CARE INSTRUCTIONS    Take all medicine as directed by your caregiver.   If you do not have an appetite, do not force yourself to eat. However, you must continue to drink fluids.   If you have an appetite, eat a  normal diet unless your caregiver tells you differently.    Eat a variety of complex carbohydrates (rice, wheat, potatoes, bread), lean meats, yogurt, fruits, and vegetables.    Avoid high-fat foods because they are more difficult to digest.   Drink enough water and fluids to keep your urine clear or pale yellow.   If you are dehydrated, ask your caregiver for specific rehydration instructions. Signs of dehydration may include:    Severe thirst.    Dry lips and mouth.    Dizziness.    Dark urine.    Decreasing urine frequency and amount.    Confusion.    Rapid breathing or pulse.  SEEK IMMEDIATE MEDICAL CARE IF:    You have   blood or brown flecks (like coffee grounds) in your vomit.   You have black or bloody stools.   You have a severe headache or stiff neck.   You are confused.   You have severe abdominal pain.   You have chest pain or trouble breathing.   You do not urinate at least once every 8 hours.   You develop cold or clammy skin.   You continue to vomit for longer than 24 to 48 hours.   You have a fever.  MAKE SURE YOU:    Understand these instructions.   Will watch your condition.   Will get help right away if you are not doing well or get worse.     This information is not intended to replace advice given to you by your health care provider. Make sure you discuss any questions you have with your health care provider.     Document Released: 08/31/2005 Document Revised: 11/23/2011 Document Reviewed: 01/28/2011  Elsevier Interactive Patient Education 2016 [employer] Inc[employer]

## 2015-09-26 ENCOUNTER — Emergency Department
Admission: EM | Admit: 2015-09-26 | Discharge: 2015-09-26 | Disposition: A | Discharge status: Home / Self Care | Payer: Medicare Other | Attending: Emergency Medicine | Admitting: Emergency Medicine

## 2015-09-26 ENCOUNTER — Emergency Department: Payer: Medicare Other

## 2015-09-26 ENCOUNTER — Encounter: Payer: Self-pay | Admitting: Medical Oncology

## 2015-09-26 DIAGNOSIS — Y998 Other external cause status: Secondary | ICD-10-CM | POA: Diagnosis not present

## 2015-09-26 DIAGNOSIS — E119 Type 2 diabetes mellitus without complications: Secondary | ICD-10-CM | POA: Diagnosis not present

## 2015-09-26 DIAGNOSIS — W108XXA Fall (on) (from) other stairs and steps, initial encounter: Secondary | ICD-10-CM | POA: Diagnosis not present

## 2015-09-26 DIAGNOSIS — Y9389 Activity, other specified: Secondary | ICD-10-CM | POA: Diagnosis not present

## 2015-09-26 DIAGNOSIS — Z89519 Acquired absence of unspecified leg below knee: Secondary | ICD-10-CM | POA: Diagnosis not present

## 2015-09-26 DIAGNOSIS — S8991XA Unspecified injury of right lower leg, initial encounter: Secondary | ICD-10-CM | POA: Diagnosis present

## 2015-09-26 DIAGNOSIS — Y9289 Other specified places as the place of occurrence of the external cause: Secondary | ICD-10-CM | POA: Insufficient documentation

## 2015-09-26 DIAGNOSIS — S82091A Other fracture of right patella, initial encounter for closed fracture: Secondary | ICD-10-CM | POA: Insufficient documentation

## 2015-09-26 DIAGNOSIS — Z794 Long term (current) use of insulin: Secondary | ICD-10-CM | POA: Insufficient documentation

## 2015-09-26 DIAGNOSIS — Z79899 Other long term (current) drug therapy: Secondary | ICD-10-CM | POA: Insufficient documentation

## 2015-09-26 DIAGNOSIS — F17210 Nicotine dependence, cigarettes, uncomplicated: Secondary | ICD-10-CM | POA: Diagnosis not present

## 2015-09-26 DIAGNOSIS — I10 Essential (primary) hypertension: Secondary | ICD-10-CM | POA: Diagnosis not present

## 2015-09-26 DIAGNOSIS — S80211A Abrasion, right knee, initial encounter: Secondary | ICD-10-CM | POA: Insufficient documentation

## 2015-09-26 DIAGNOSIS — S82001A Unspecified fracture of right patella, initial encounter for closed fracture: Secondary | ICD-10-CM

## 2015-09-26 MED ORDER — OXYCODONE HCL 5 MG PO CAPS: 5.0000 mg | ORAL_CAPSULE | ORAL | Status: AC | PRN

## 2015-09-26 NOTE — Discharge Instructions (Signed)
Follow[facility] for your fractured patella Ice and elevate for swelling. Continue your oxycodone at home

## 2015-09-26 NOTE — ED Notes (Signed)
Pt reports that he was attempting to go up some steps yesterday, the steps were not handicap accessible and pt fell onto rt knee, abrasion noted. Pt denies hitting head or LOC.

## 2015-09-26 NOTE — ED Provider Notes (Signed)
[facility] Emergency Department Provider Note  ____________________________________________  Time seen: Approximately 2:21 PM  I have reviewed the triage vital signs and the nursing notes.   HISTORY  Chief Complaint Fall   HPI [name] is a 45 y.o. male is here with complaint of right knee pain. Patient states that he was attempting to go up some steps yesterday at a place of business that was not wheelchair accessible. He states he landed directly onto his right knee. Patient had a below the knee amputation and in October and does not have a prosthesis at this point. Patient states that his right knee has continued to hurt. He denies hitting his head or any loss of consciousness during this accident.   Past Medical History  Diagnosis Date  . Diabetes mellitus without complication (HCC)   . Hypertension   . COPD (chronic obstructive pulmonary disease) (HCC)   . Asthma   . Renal disorder   . Thrombocytopenia (HCC)   . Myocardial infarction (HCC)   . Gastric ulcer     Patient Active Problem List   Diagnosis Date Noted  . Diabetic foot infection (HCC) 05/08/2015  . DM (diabetes mellitus) (HCC) 05/08/2015  . HTN (hypertension) 05/08/2015  . COPD (chronic obstructive pulmonary disease) (HCC) 05/08/2015    Past Surgical History  Procedure Laterality Date  . Cholecystectomy    . Foot surgery Left   . Left below knee amputation      Current Outpatient Rx  Name  Route  Sig  Dispense  Refill  . acetaminophen (TYLENOL) 500 MG tablet   Oral   Take 1,000 mg by mouth every 6 (six) hours as needed for mild pain.          Marland Kitchen albuterol (PROAIR HFA) 108 (90 BASE) MCG/ACT inhaler   Inhalation   Inhale 2 puffs into the lungs every 4 (four) hours as needed for wheezing or shortness of breath.          . carbamazepine (TEGRETOL) 200 MG tablet   Oral   Take 1 tablet by mouth daily.         . carvedilol (COREG) 12.5 MG tablet   Oral   Take  1 tablet by mouth 2 (two) times daily.         Marland Kitchen docusate sodium (COLACE) 100 MG capsule   Oral   Take 100 mg by mouth 2 (two) times daily as needed for moderate constipation.          . gabapentin (NEURONTIN) 300 MG capsule   Oral   Take 2 capsules by mouth 3 (three) times daily.         Marland Kitchen HYDROmorphone (DILAUDID) 2 MG tablet   Oral   Take 2 mg by mouth every 4 (four) hours as needed for severe pain.         Marland Kitchen insulin aspart (NOVOLOG) 100 UNIT/ML injection   Subcutaneous   Inject 2 Units into the skin See admin instructions. Inject 2 units three times a day for every 20 mg/dl of blood glucose above 150mg /dl         . isosorbide mononitrate (IMDUR) 30 MG 24 hr tablet   Oral   Take 1 tablet by mouth 2 (two) times daily.         Marland Kitchen oxycodone (OXY-IR) 5 MG capsule   Oral   Take 1 capsule (5 mg total) by mouth every 4 (four) hours as needed.   5 capsule   0   .  pantoprazole (PROTONIX) 40 MG tablet   Oral   Take 40 mg by mouth 2 (two) times daily.         . pravastatin (PRAVACHOL) 20 MG tablet   Oral   Take 1 tablet by mouth at bedtime.         . promethazine (PHENERGAN) 25 MG tablet   Oral   Take 1 tablet (25 mg total) by mouth every 6 (six) hours as needed for nausea or vomiting.   20 tablet   0   . QUEtiapine (SEROQUEL) 400 MG tablet   Oral   Take 1 tablet by mouth at bedtime.         . rivaroxaban (XARELTO) 20 MG TABS tablet   Oral   Take 20 mg by mouth daily.          . sucralfate (CARAFATE) 1 G tablet   Oral   Take 1 g by mouth daily.         . Tapentadol HCl (NUCYNTA ER) 200 MG TB12   Oral   Take 1 tablet by mouth every 12 (twelve) hours.         Marland Kitchen tiotropium (SPIRIVA) 18 MCG inhalation capsule   Inhalation   Place 1 capsule into inhaler and inhale 2 (two) times daily.            Allergies Demerol; Erythromycin; and Morphine and related  Family History  Problem Relation Age of Onset  . Adopted: Yes    Social  History Social History  Substance Use Topics  . Smoking status: Current Every Day Smoker -- 1.00 packs/day    Types: Cigarettes  . Smokeless tobacco: Never Used  . Alcohol Use: No    Review of Systems Constitutional: No fever/chills Eyes: No visual changes. ENT: No trauma Cardiovascular: Denies chest pain. Respiratory: Denies shortness of breath. Gastrointestinal: No abdominal pain.  No nausea, no vomiting.  Musculoskeletal: Negative for back pain. Positive right knee pain Skin: Negative for rash. Neurological: Negative for headaches, focal weakness or numbness.  10-point ROS otherwise negative.  ____________________________________________   PHYSICAL EXAM:  VITAL SIGNS: ED Triage Vitals  Enc Vitals Group     BP 09/26/15 1300 120/82 mmHg     Pulse Rate 09/26/15 1300 85     Resp 09/26/15 1300 18     Temp 09/26/15 1300 97.7 F (36.5 C)     Temp Source 09/26/15 1300 Oral     SpO2 09/26/15 1300 100 %     Weight 09/26/15 1300 175 lb (79.379 kg)     Height 09/26/15 1300 6\' 1"  (1.854 m)     Head Cir --      Peak Flow --      Pain Score 09/26/15 1300 8     Pain Loc --      Pain Edu? --      Excl. in GC? --     Constitutional: Alert and oriented. Well appearing and in no acute distress. Eyes: Conjunctivae are normal. PERRL. EOMI. Head: Atraumatic. Nose: No congestion/rhinnorhea. Mouth/Throat: No trauma Neck: No stridor.   Cardiovascular: Normal rate, regular rhythm. Grossly normal heart sounds.  Good peripheral circulation. Respiratory: Normal respiratory effort.  No retractions. Lungs CTAB. Musculoskeletal: Moderate tenderness and soft tissue edema noted anterior right knee. Neurologic:  Normal speech and language. No gross focal neurologic deficits are appreciated. No gait instability. Skin:  Skin is warm, dry and intact. Superficial abrasion noted anterior right knee anterior aspect. No active bleeding is noted. Psychiatric:  Mood and affect are normal. Speech and  behavior are normal.  ____________________________________________   LABS (all labs ordered are listed, but only abnormal results are displayed)  Labs Reviewed - No data to display   RADIOLOGY Inferior patella fracture per radiologist I, Tommi Rumpshonda L Summers, personally viewed and evaluated these images (plain radiographs) as part of my medical decision making, as well as reviewing the written report by the radiologist.  ____________________________________________   PROCEDURES  Procedure(s) performed: None  Critical Care performed: No  ____________________________________________   INITIAL IMPRESSION / ASSESSMENT AND PLAN / ED COURSE  Pertinent labs & imaging results that were available during my care of the patient were reviewed by me and considered in my medical decision making (see chart for details).  Patient was given a prescription for oxycodone 5 mg #5. He is to follow up with his primary care doctor for any continued medication as the [facility] reporting website shows the patient has gotten during the month of December oxycodone 5 mg # 300 and 15 hydromorphone 2 mg tablets. [facility] his [facility]ycodone and only take the hydromorphone. Patient was placed in a knee immobilizer and told to [facility] or Dr. Elenor LegatoHooten's office. ____________________________________________   FINAL CLINICAL IMPRESSION(S) / ED DIAGNOSES  Final diagnoses:  Patella fracture, right, closed, initial encounter      Tommi RumpsRhonda L Summers, PA-C 09/26/15 1544  Arnaldo NatalPaul F Malinda, MD 09/26/15 1656

## 2015-11-11 ENCOUNTER — Emergency Department
Admission: EM | Admit: 2015-11-11 | Discharge: 2015-11-11 | Disposition: A | Discharge status: Home / Self Care | Payer: Medicare Other | Attending: Emergency Medicine | Admitting: Emergency Medicine

## 2015-11-11 ENCOUNTER — Encounter: Payer: Self-pay | Admitting: Emergency Medicine

## 2015-11-11 DIAGNOSIS — Z79891 Long term (current) use of opiate analgesic: Secondary | ICD-10-CM | POA: Diagnosis not present

## 2015-11-11 DIAGNOSIS — Z7289 Other problems related to lifestyle: Secondary | ICD-10-CM | POA: Diagnosis not present

## 2015-11-11 DIAGNOSIS — Z79899 Other long term (current) drug therapy: Secondary | ICD-10-CM | POA: Diagnosis not present

## 2015-11-11 DIAGNOSIS — R112 Nausea with vomiting, unspecified: Secondary | ICD-10-CM | POA: Insufficient documentation

## 2015-11-11 DIAGNOSIS — Z794 Long term (current) use of insulin: Secondary | ICD-10-CM | POA: Insufficient documentation

## 2015-11-11 DIAGNOSIS — I10 Essential (primary) hypertension: Secondary | ICD-10-CM | POA: Diagnosis not present

## 2015-11-11 DIAGNOSIS — Z7901 Long term (current) use of anticoagulants: Secondary | ICD-10-CM | POA: Diagnosis not present

## 2015-11-11 DIAGNOSIS — R1010 Upper abdominal pain, unspecified: Secondary | ICD-10-CM | POA: Insufficient documentation

## 2015-11-11 DIAGNOSIS — F17210 Nicotine dependence, cigarettes, uncomplicated: Secondary | ICD-10-CM | POA: Insufficient documentation

## 2015-11-11 DIAGNOSIS — Z765 Malingerer [conscious simulation]: Secondary | ICD-10-CM

## 2015-11-11 DIAGNOSIS — R110 Nausea: Secondary | ICD-10-CM

## 2015-11-11 DIAGNOSIS — E119 Type 2 diabetes mellitus without complications: Secondary | ICD-10-CM | POA: Diagnosis not present

## 2015-11-11 LAB — URINALYSIS COMPLETE WITH MICROSCOPIC (ARMC ONLY)
BILIRUBIN URINE: NEGATIVE
Bacteria, UA: NONE SEEN
GLUCOSE, UA: NEGATIVE mg/dL
Hgb urine dipstick: NEGATIVE
Leukocytes, UA: NEGATIVE
Nitrite: NEGATIVE
Protein, ur: 100 mg/dL — AB
SQUAMOUS EPITHELIAL / LPF: NONE SEEN
Specific Gravity, Urine: 1.025 (ref 1.005–1.030)
pH: 9 — ABNORMAL HIGH (ref 5.0–8.0)

## 2015-11-11 LAB — COMPREHENSIVE METABOLIC PANEL
ALK PHOS: 161 U/L — AB (ref 38–126)
ALT: 14 U/L — ABNORMAL LOW (ref 17–63)
ANION GAP: 15 (ref 5–15)
AST: 24 U/L (ref 15–41)
Albumin: 4.4 g/dL (ref 3.5–5.0)
BUN: 13 mg/dL (ref 6–20)
CO2: 28 mmol/L (ref 22–32)
Calcium: 10.2 mg/dL (ref 8.9–10.3)
Chloride: 100 mmol/L — ABNORMAL LOW (ref 101–111)
Creatinine, Ser: 1.32 mg/dL — ABNORMAL HIGH (ref 0.61–1.24)
GFR calc Af Amer: >60 mL/min (ref >60)
GFR calc non Af Amer: >60 mL/min (ref >60)
GLUCOSE: 165 mg/dL — AB (ref 65–99)
POTASSIUM: 3.8 mmol/L (ref 3.5–5.1)
Sodium: 143 mmol/L (ref 135–145)
Total Bilirubin: 0.5 mg/dL (ref 0.3–1.2)
Total Protein: 9 g/dL — ABNORMAL HIGH (ref 6.5–8.1)

## 2015-11-11 LAB — CBC WITH DIFFERENTIAL/PLATELET
Basophils Absolute: 0.1 10*3/uL (ref 0–0.1)
Basophils Relative: 1 %
Eosinophils Absolute: 0 10*3/uL (ref 0–0.7)
Eosinophils Relative: 0 %
HEMATOCRIT: 39.5 % — AB (ref 40.0–52.0)
Hemoglobin: 13.4 g/dL (ref 13.0–18.0)
LYMPHS ABS: 1.3 10*3/uL (ref 1.0–3.6)
LYMPHS PCT: 15 %
MCH: 31 pg (ref 26.0–34.0)
MCHC: 33.8 g/dL (ref 32.0–36.0)
MCV: 91.9 fL (ref 80.0–100.0)
MONO ABS: 0.6 10*3/uL (ref 0.2–1.0)
MONOS PCT: 7 %
NEUTROS ABS: 6.7 10*3/uL — AB (ref 1.4–6.5)
Neutrophils Relative %: 77 %
Platelets: 415 10*3/uL (ref 150–440)
RBC: 4.3 MIL/uL — ABNORMAL LOW (ref 4.40–5.90)
RDW: 18.5 % — AB (ref 11.5–14.5)
WBC: 8.8 10*3/uL (ref 3.8–10.6)

## 2015-11-11 LAB — LIPASE, BLOOD: Lipase: 19 U/L (ref 11–51)

## 2015-11-11 LAB — TROPONIN I: Troponin I: <0.03 ng/mL (ref <0.031)

## 2015-11-11 MED ORDER — GI COCKTAIL ~~LOC~~
30.0000 mL | Freq: Once | ORAL | Status: AC
  Administered 2015-11-11: 30 mL via ORAL
  Filled 2015-11-11: qty 30

## 2015-11-11 MED ORDER — DIPHENHYDRAMINE HCL 50 MG/ML IJ SOLN
50.0000 mg | Freq: Once | INTRAMUSCULAR | Status: AC
  Administered 2015-11-11: 50 mg via INTRAVENOUS
  Filled 2015-11-11: qty 1

## 2015-11-11 MED ORDER — OXYCODONE HCL 5 MG PO TABS
5.0000 mg | ORAL_TABLET | Freq: Once | ORAL | Status: AC
  Administered 2015-11-11: 5 mg via ORAL
  Filled 2015-11-11: qty 1

## 2015-11-11 MED ORDER — SODIUM CHLORIDE 0.9 % IV BOLUS (SEPSIS)
1000.0000 mL | Freq: Once | INTRAVENOUS | Status: AC
  Administered 2015-11-11: 1000 mL via INTRAVENOUS

## 2015-11-11 MED ORDER — METOCLOPRAMIDE HCL 5 MG/ML IJ SOLN
10.0000 mg | Freq: Once | INTRAMUSCULAR | Status: AC
  Administered 2015-11-11: 10 mg via INTRAVENOUS
  Filled 2015-11-11: qty 2

## 2015-11-11 NOTE — ED Notes (Signed)
Pt

## 2015-11-11 NOTE — ED Provider Notes (Signed)
[facility] Emergency Department Provider Note  ____________________________________________  Time seen: 10:20 AM  I have reviewed the triage vital signs and the nursing notes.   HISTORY  Chief Complaint Abdominal Pain    HPI [name] is a 45 y.o. male who complains of nausea vomiting and upper abdominal pain for the past 2 days. States that he has been unable to keep anything down including his medications and complains of severe pain due to not being able to take his MS Contin and oxycodone. Review of drug database that show that within the past few weeks he is refilled a month supply of MS Contin as well as a prescription for 240 oxycodone tablets.  Per triage nurse report, patient sat calmly and comfortably in the waiting room for one hour without any vomiting. Complains of severe nausea at this time and requests IV pain medication. Also per nursing report, patient refused straight stick for blood draw and is only willing to have blood drawn if an IV is placed.  Patient has a history of drug abuse and polysubstance abuse.     Past Medical History  Diagnosis Date  . Diabetes mellitus without complication (HCC)   . Hypertension   . COPD (chronic obstructive pulmonary disease) (HCC)   . Asthma   . Renal disorder   . Thrombocytopenia (HCC)   . Myocardial infarction (HCC)   . Gastric ulcer      Patient Active Problem List   Diagnosis Date Noted  . Diabetic foot infection (HCC) 05/08/2015  . DM (diabetes mellitus) (HCC) 05/08/2015  . HTN (hypertension) 05/08/2015  . COPD (chronic obstructive pulmonary disease) (HCC) 05/08/2015     Past Surgical History  Procedure Laterality Date  . Cholecystectomy    . Foot surgery Left   . Left below knee amputation       Current Outpatient Rx  Name  Route  Sig  Dispense  Refill  . acetaminophen (TYLENOL) 500 MG tablet   Oral   Take 1,000 mg by mouth every 6 (six) hours as needed for mild  pain.          Marland Kitchen albuterol (PROAIR HFA) 108 (90 BASE) MCG/ACT inhaler   Inhalation   Inhale 2 puffs into the lungs every 4 (four) hours as needed for wheezing or shortness of breath.          . carbamazepine (TEGRETOL) 200 MG tablet   Oral   Take 1 tablet by mouth 2 (two) times daily.          . carvedilol (COREG) 12.5 MG tablet   Oral   Take 1 tablet by mouth 2 (two) times daily.         Marland Kitchen docusate sodium (COLACE) 100 MG capsule   Oral   Take 100 mg by mouth 2 (two) times daily as needed for moderate constipation.          . gabapentin (NEURONTIN) 300 MG capsule   Oral   Take 2 capsules by mouth 3 (three) times daily.         Marland Kitchen HYDROmorphone (DILAUDID) 2 MG tablet   Oral   Take 2 mg by mouth every 4 (four) hours as needed for severe pain.         Marland Kitchen insulin aspart (NOVOLOG) 100 UNIT/ML injection   Subcutaneous   Inject 2 Units into the skin See admin instructions. Inject 2 units three times a day for every 20 mg/dl of blood glucose above /dl         .  isosorbide mononitrate (IMDUR) 30 MG 24 hr tablet   Oral   Take 1 tablet by mouth 2 (two) times daily.         Marland Kitchen morphine (MS CONTIN) 30 MG 12 hr tablet   Oral   Take 30 mg by mouth every 12 (twelve) hours.         . ondansetron (ZOFRAN) 4 MG tablet   Oral   Take 1 tablet by mouth as needed.         Marland Kitchen oxycodone (OXY-IR) 5 MG capsule   Oral   Take 1 capsule (5 mg total) by mouth every 4 (four) hours as needed. Patient taking differently: Take 10 mg by mouth every 6 (six) hours as needed.    5 capsule   0   . pantoprazole (PROTONIX) 40 MG tablet   Oral   Take 40 mg by mouth 2 (two) times daily.         . pravastatin (PRAVACHOL) 20 MG tablet   Oral   Take 1 tablet by mouth at bedtime.         Marland Kitchen QUEtiapine (SEROQUEL) 400 MG tablet   Oral   Take 1 tablet by mouth at bedtime.         . rivaroxaban (XARELTO) 20 MG TABS tablet   Oral   Take 20 mg by mouth daily.          .  sucralfate (CARAFATE) 1 G tablet   Oral   Take 1 g by mouth daily as needed.          . tiotropium (SPIRIVA) 18 MCG inhalation capsule   Inhalation   Place 1 capsule into inhaler and inhale 2 (two) times daily.          . promethazine (PHENERGAN) 25 MG tablet   Oral   Take 1 tablet (25 mg total) by mouth every 6 (six) hours as needed for nausea or vomiting.   20 tablet   0   . Tapentadol HCl (NUCYNTA ER) 200 MG TB12   Oral   Take 1 tablet by mouth every 12 (twelve) hours.            Allergies Demerol; Erythromycin; and Morphine and related   Family History  Problem Relation Age of Onset  . Adopted: Yes    Social History Social History  Substance Use Topics  . Smoking status: Current Every Day Smoker -- 1.00 packs/day    Types: Cigarettes  . Smokeless tobacco: Never Used  . Alcohol Use: No    Review of Systems  Constitutional:   No fever or chills. No weight changes Eyes:   No blurry vision or double vision.  ENT:   No sore throat.  Cardiovascular:   No chest pain. Respiratory:   No dyspnea or cough. Gastrointestinal:   Positive abdominal pain with vomiting. No diarrhea..  No BRBPR or melena. Genitourinary:   Negative for dysuria or difficulty urinating. Musculoskeletal:   Negative for back pain. No joint swelling or pain. Skin:   Negative for rash. Neurological:   Negative for headaches, focal weakness or numbness. Psychiatric:  No anxiety or depression.   Endocrine:  No changes in energy or sleep difficulty.  10-point ROS otherwise negative.  ____________________________________________   PHYSICAL EXAM:  VITAL SIGNS: ED Triage Vitals  Enc Vitals Group     BP 11/11/15 0945 135/92 mmHg     Pulse Rate 11/11/15 0945 74     Resp 11/11/15 0945 18  Temp 11/11/15 0945 98.1 F (36.7 C)     Temp Source 11/11/15 0945 Oral     SpO2 11/11/15 0945 97 %     Weight 11/11/15 0945 158 lb (71.668 kg)     Height 11/11/15 0945 6\' 1"  (1.854 m)     Head  Cir --      Peak Flow --      Pain Score 11/11/15 0930 10     Pain Loc --      Pain Edu? --      Excl. in GC? --     Vital signs reviewed, nursing assessments reviewed.   Constitutional:   Alert and oriented. Well appearing and in no distress. Eyes:   No scleral icterus. No conjunctival pallor. PERRL. EOMI ENT   Head:   Normocephalic and atraumatic.   Nose:   No congestion/rhinnorhea. No septal hematoma   Mouth/Throat:   MMM, no pharyngeal erythema. No peritonsillar mass.    Neck:   No stridor. No SubQ emphysema. No meningismus. Hematological/Lymphatic/Immunilogical:   No cervical lymphadenopathy. Cardiovascular:   RRR. Symmetric bilateral radial and DP pulses.  No murmurs.  Respiratory:   Normal respiratory effort without tachypnea nor retractions. Breath sounds are clear and equal bilaterally. No wheezes/rales/rhonchi. Gastrointestinal:   Soft and nontender. Non distended. There is no CVA tenderness.  No rebound, rigidity, or guarding. No heaving or vomiting during my assessment. Empty emesis bag at patient's bedside, patient does not seem concerned when the emesis bag is removed from his reach. Genitourinary:   deferred Musculoskeletal:   Nontender with normal range of motion in all extremities. No joint effusions.  No lower extremity tenderness.  No edema. Neurologic:   Normal speech and language.  CN 2-10 normal. Motor grossly intact. No gross focal neurologic deficits are appreciated.  Skin:    Skin is warm, dry and intact. No rash noted.  No petechiae, purpura, or bullae. Psychiatric:   Mood and affect are normal. ____________________________________________    LABS (pertinent positives/negatives) (all labs ordered are listed, but only abnormal results are displayed) Labs Reviewed  COMPREHENSIVE METABOLIC PANEL - Abnormal; Notable for the following:    Chloride 100 (*)    Glucose, Bld 165 (*)    Creatinine, Ser 1.32 (*)    Total Protein 9.0 (*)    ALT 14  (*)    Alkaline Phosphatase 161 (*)    All other components within normal limits  CBC WITH DIFFERENTIAL/PLATELET - Abnormal; Notable for the following:    RBC 4.30 (*)    HCT 39.5 (*)    RDW 18.5 (*)    Neutro Abs 6.7 (*)    All other components within normal limits  URINALYSIS COMPLETEWITH MICROSCOPIC ([facility] ONLY) - Abnormal; Notable for the following:    Color, Urine YELLOW (*)    APPearance CLEAR (*)    Ketones, ur 1+ (*)    pH 9.0 (*)    Protein, ur 100 (*)    All other components within normal limits  LIPASE, BLOOD  TROPONIN I   ____________________________________________   EKG  Interpreted by me  Date: 11/11/2015  Rate: 90  Rhythm: normal sinus rhythm  QRS Axis: normal  Intervals: normal  ST/T Wave abnormalities: normal  Conduction Disutrbances: none  Narrative Interpretation: unremarkable      ____________________________________________    RADIOLOGY    ____________________________________________   PROCEDURES   ____________________________________________   INITIAL IMPRESSION / ASSESSMENT AND PLAN / ED COURSE  Pertinent labs & imaging results that  were available during my care of the patient were reviewed by me and considered in my medical decision making (see chart for details).  Patient presents with complaint of abdominal pain nausea vomiting. However this appears to be factitious as there has been no observed vomiting in the emergency department despite the severity that he describes. Additionally, the patient indicated to EMS and triage nurse that he was unwilling to see certain positions that he believes would be less willing to provide him pain medicine.. With his medical history we'll check labs as a screening measure. I'll offer the patient Reglan and Benadryl for nausea relief.  ----------------------------------------- 1:34 PM on 11/11/2015 ----------------------------------------- Patient frequently asking for IV pain medication. On  entering the treatment room, the nurse reports that she observe the patient trying to induce vomiting by gagging himself with his fingers. Patient was offered his home medication of oxycodone 5 mg tablet, which he tolerated without difficulty. Patient is drinking fluids without difficulty. Labs are unremarkable and completely at baseline, vital signs are normal. Patient appears well-hydrated and has no orthostatic symptoms. At this point it does not appear the patient is hyperglycemic dehydrated acidotic or having any acute abdominal or cardiovascular or pulmonary pathology. He is well known for drug seeking behavior and factitious complaints. We'll discharge to follow-up with his primary care at the [facility].      ____________________________________________   FINAL CLINICAL IMPRESSION(S) / ED DIAGNOSES  Final diagnoses:  Drug-seeking behavior  Nausea      Sharman Cheek, MD 11/11/15 1339

## 2015-11-11 NOTE — ED Notes (Signed)
Pt was heard vomiting. When RN went into room, pt was sticking finger down throat to make himself vomit. MD notified.

## 2015-11-11 NOTE — ED Notes (Signed)
Reports abd pain and n/v.  Pt states "if Dr Mayford Knife is here i want to go back home".

## 2015-11-11 NOTE — ED Notes (Signed)
Pt given ginger ale. Pt able to tolerate fluids.

## 2015-11-11 NOTE — ED Notes (Signed)
Pt requesting pain medication; MD aware.

## 2015-11-11 NOTE — Discharge Instructions (Signed)

## 2016-07-25 ENCOUNTER — Encounter: Payer: Self-pay | Admitting: Urgent Care

## 2016-07-25 ENCOUNTER — Emergency Department
Admission: EM | Admit: 2016-07-25 | Discharge: 2016-07-25 | Disposition: A | Discharge status: Home / Self Care | Payer: No Typology Code available for payment source | Attending: Emergency Medicine | Admitting: Emergency Medicine

## 2016-07-25 DIAGNOSIS — J449 Chronic obstructive pulmonary disease, unspecified: Secondary | ICD-10-CM | POA: Diagnosis not present

## 2016-07-25 DIAGNOSIS — F17210 Nicotine dependence, cigarettes, uncomplicated: Secondary | ICD-10-CM | POA: Insufficient documentation

## 2016-07-25 DIAGNOSIS — E119 Type 2 diabetes mellitus without complications: Secondary | ICD-10-CM | POA: Insufficient documentation

## 2016-07-25 DIAGNOSIS — I10 Essential (primary) hypertension: Secondary | ICD-10-CM | POA: Insufficient documentation

## 2016-07-25 DIAGNOSIS — J45909 Unspecified asthma, uncomplicated: Secondary | ICD-10-CM | POA: Insufficient documentation

## 2016-07-25 DIAGNOSIS — Z79899 Other long term (current) drug therapy: Secondary | ICD-10-CM | POA: Insufficient documentation

## 2016-07-25 DIAGNOSIS — R45851 Suicidal ideations: Secondary | ICD-10-CM

## 2016-07-25 DIAGNOSIS — Z794 Long term (current) use of insulin: Secondary | ICD-10-CM | POA: Diagnosis not present

## 2016-07-25 DIAGNOSIS — F4310 Post-traumatic stress disorder, unspecified: Secondary | ICD-10-CM | POA: Diagnosis not present

## 2016-07-25 LAB — COMPREHENSIVE METABOLIC PANEL
ALBUMIN: 3.9 g/dL (ref 3.5–5.0)
ALT: 12 U/L — ABNORMAL LOW (ref 17–63)
AST: 20 U/L (ref 15–41)
Alkaline Phosphatase: 148 U/L — ABNORMAL HIGH (ref 38–126)
Anion gap: 11 (ref 5–15)
BILIRUBIN TOTAL: 0.5 mg/dL (ref 0.3–1.2)
BUN: 9 mg/dL (ref 6–20)
CO2: 25 mmol/L (ref 22–32)
Calcium: 9 mg/dL (ref 8.9–10.3)
Chloride: 98 mmol/L — ABNORMAL LOW (ref 101–111)
Creatinine, Ser: 0.91 mg/dL (ref 0.61–1.24)
GFR calc Af Amer: >60 mL/min (ref >60)
GFR calc non Af Amer: >60 mL/min (ref >60)
GLUCOSE: 121 mg/dL — AB (ref 65–99)
POTASSIUM: 4.4 mmol/L (ref 3.5–5.1)
Sodium: 134 mmol/L — ABNORMAL LOW (ref 135–145)
TOTAL PROTEIN: 8.2 g/dL — AB (ref 6.5–8.1)

## 2016-07-25 LAB — CBC
HEMATOCRIT: 40.9 % (ref 40.0–52.0)
Hemoglobin: 14.1 g/dL (ref 13.0–18.0)
MCH: 36.4 pg — ABNORMAL HIGH (ref 26.0–34.0)
MCHC: 34.4 g/dL (ref 32.0–36.0)
MCV: 105.7 fL — AB (ref 80.0–100.0)
Platelets: 232 10*3/uL (ref 150–440)
RBC: 3.87 MIL/uL — AB (ref 4.40–5.90)
RDW: 14.4 % (ref 11.5–14.5)
WBC: 7.1 10*3/uL (ref 3.8–10.6)

## 2016-07-25 LAB — URINE DRUG SCREEN, QUALITATIVE (ARMC ONLY)
AMPHETAMINES, UR SCREEN: NOT DETECTED
Barbiturates, Ur Screen: NOT DETECTED
Benzodiazepine, Ur Scrn: POSITIVE — AB
Cannabinoid 50 Ng, Ur ~~LOC~~: NOT DETECTED
Cocaine Metabolite,Ur ~~LOC~~: NOT DETECTED
MDMA (ECSTASY) UR SCREEN: NOT DETECTED
Methadone Scn, Ur: NOT DETECTED
OPIATE, UR SCREEN: NOT DETECTED
PHENCYCLIDINE (PCP) UR S: NOT DETECTED
Tricyclic, Ur Screen: NOT DETECTED

## 2016-07-25 LAB — ACETAMINOPHEN LEVEL: Acetaminophen (Tylenol), Serum: <10 ug/mL — ABNORMAL LOW (ref 10–30)

## 2016-07-25 LAB — SALICYLATE LEVEL: Salicylate Lvl: <7 mg/dL (ref 2.8–30.0)

## 2016-07-25 LAB — ETHANOL: Alcohol, Ethyl (B): 140 mg/dL — ABNORMAL HIGH (ref <5)

## 2016-07-25 MED ORDER — QUETIAPINE FUMARATE 200 MG PO TABS: 400.0000 mg | ORAL_TABLET | Freq: Every day | ORAL | Status: DC

## 2016-07-25 MED ORDER — PANTOPRAZOLE SODIUM 40 MG PO TBEC
40.0000 mg | DELAYED_RELEASE_TABLET | Freq: Two times a day (BID) | ORAL | Status: DC
  Administered 2016-07-25: 40 mg via ORAL
  Filled 2016-07-25: qty 1

## 2016-07-25 MED ORDER — ACETAMINOPHEN 500 MG PO TABS: 1000.0000 mg | ORAL_TABLET | Freq: Four times a day (QID) | ORAL | Status: DC | PRN

## 2016-07-25 MED ORDER — FERROUS SULFATE 325 (65 FE) MG PO TBEC
325.0000 mg | DELAYED_RELEASE_TABLET | Freq: Every day | ORAL | Status: DC
  Administered 2016-07-25: 325 mg via ORAL

## 2016-07-25 MED ORDER — ALBUTEROL SULFATE HFA 108 (90 BASE) MCG/ACT IN AERS: 2.0000 | INHALATION_SPRAY | RESPIRATORY_TRACT | Status: DC | PRN

## 2016-07-25 MED ORDER — PRAVASTATIN SODIUM 20 MG PO TABS: 20.0000 mg | ORAL_TABLET | Freq: Every day | ORAL | Status: DC

## 2016-07-25 MED ORDER — ISOSORBIDE MONONITRATE ER 30 MG PO TB24
30.0000 mg | ORAL_TABLET | Freq: Two times a day (BID) | ORAL | Status: DC
  Administered 2016-07-25: 30 mg via ORAL
  Filled 2016-07-25 (×2): qty 1

## 2016-07-25 MED ORDER — DIPHENHYDRAMINE HCL 50 MG/ML IJ SOLN
50.0000 mg | Freq: Once | INTRAMUSCULAR | Status: AC
  Administered 2016-07-25: 50 mg via INTRAMUSCULAR

## 2016-07-25 MED ORDER — HALOPERIDOL LACTATE 5 MG/ML IJ SOLN
INTRAMUSCULAR | Status: AC
  Administered 2016-07-25: 5 mg via INTRAMUSCULAR
  Filled 2016-07-25: qty 1

## 2016-07-25 MED ORDER — DIPHENHYDRAMINE HCL 50 MG/ML IJ SOLN
INTRAMUSCULAR | Status: AC
  Administered 2016-07-25: 50 mg via INTRAMUSCULAR
  Filled 2016-07-25: qty 1

## 2016-07-25 MED ORDER — NITROGLYCERIN 0.4 MG SL SUBL
0.4000 mg | SUBLINGUAL_TABLET | SUBLINGUAL | Status: DC | PRN
  Administered 2016-07-25 (×2): 0.4 mg via SUBLINGUAL
  Filled 2016-07-25: qty 1

## 2016-07-25 MED ORDER — CARBAMAZEPINE 200 MG PO TABS
200.0000 mg | ORAL_TABLET | Freq: Two times a day (BID) | ORAL | Status: DC
  Administered 2016-07-25: 200 mg via ORAL
  Filled 2016-07-25: qty 1

## 2016-07-25 MED ORDER — FERROUS SULFATE 325 (65 FE) MG PO TABS
ORAL_TABLET | ORAL | Status: AC
  Administered 2016-07-25: 325 mg via ORAL
  Filled 2016-07-25: qty 1

## 2016-07-25 MED ORDER — MORPHINE SULFATE ER 15 MG PO TBCR
30.0000 mg | EXTENDED_RELEASE_TABLET | Freq: Two times a day (BID) | ORAL | Status: DC
  Administered 2016-07-25: 30 mg via ORAL
  Filled 2016-07-25: qty 2

## 2016-07-25 MED ORDER — HALOPERIDOL LACTATE 5 MG/ML IJ SOLN
5.0000 mg | Freq: Once | INTRAMUSCULAR | Status: AC
  Administered 2016-07-25: 5 mg via INTRAMUSCULAR

## 2016-07-25 MED ORDER — VITAMIN D 1000 UNITS PO TABS
1000.0000 [IU] | ORAL_TABLET | Freq: Every day | ORAL | Status: DC
  Administered 2016-07-25: 1000 [IU] via ORAL
  Filled 2016-07-25: qty 1

## 2016-07-25 MED ORDER — OXYCODONE-ACETAMINOPHEN 5-325 MG PO TABS
2.0000 | ORAL_TABLET | Freq: Four times a day (QID) | ORAL | Status: DC | PRN
  Administered 2016-07-25: 2 via ORAL
  Filled 2016-07-25: qty 2

## 2016-07-25 MED ORDER — ADULT MULTIVITAMIN W/MINERALS CH
ORAL_TABLET | ORAL | Status: AC
  Administered 2016-07-25: 1 via ORAL
  Filled 2016-07-25: qty 1

## 2016-07-25 MED ORDER — GABAPENTIN 300 MG PO CAPS
600.0000 mg | ORAL_CAPSULE | Freq: Three times a day (TID) | ORAL | Status: DC
  Administered 2016-07-25 (×2): 600 mg via ORAL
  Filled 2016-07-25 (×3): qty 2

## 2016-07-25 MED ORDER — INSULIN GLARGINE 100 UNIT/ML ~~LOC~~ SOLN: 10.0000 [IU] | Freq: Every day | SUBCUTANEOUS | Status: DC

## 2016-07-25 MED ORDER — MULTI-VITAMIN/MINERALS PO TABS
1.0000 | ORAL_TABLET | Freq: Every day | ORAL | Status: DC
  Administered 2016-07-25: 1 via ORAL

## 2016-07-25 MED ORDER — INSULIN ASPART 100 UNIT/ML ~~LOC~~ SOLN: 50.0000 [IU] | SUBCUTANEOUS | Status: DC

## 2016-07-25 MED ORDER — CARVEDILOL 6.25 MG PO TABS
12.5000 mg | ORAL_TABLET | Freq: Two times a day (BID) | ORAL | Status: DC
  Administered 2016-07-25: 12.5 mg via ORAL
  Filled 2016-07-25: qty 2

## 2016-07-25 NOTE — ED Notes (Signed)
$  259.00 counted and provided to ODS office [place] to place in hospital safe. Patient requesting that his credit cards, watch, and life alert necklace be placed with his clothing citing that "no one can do anything with that stuff anyway". RN encouraged him to allow cards to be secured with cash. Patient declines and asks for his wallet to be left as it was when he arrived with the exception of the aforementioned cash.

## 2016-07-25 NOTE — Progress Notes (Signed)
[facility] and he stated No beds they are on Diversion for psychiatric beds.   Delta Air LinesClaudine Hooper Petteway LCSW [phone]

## 2016-07-25 NOTE — BH Assessment (Signed)
Referral information for Psychiatric Hospitalization faxed to;    [facility] ([phone] or [phone]),   [facility] ([phone]),    [facility] ([phone] or [phone]),    [facility] ([phone]),    [facility] ([phone]),    [facility] ([phone]),    [place] ([phone]),   [place] ([phone]),   [place].   

## 2016-07-25 NOTE — ED Provider Notes (Signed)
[facility]   Jene Everyobert Markeshia Giebel, MD 07/25/16 [phone]

## 2016-07-25 NOTE — ED Notes (Signed)
Pt. States he is Bipolar and suffers from PTSD.  Pt. States feeling depressed due to [employer] Day tomorrow.  Pt. States he has not been taking his Medication like he is suppose too.
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
## 2016-07-25 NOTE — ED Provider Notes (Signed)
[facility] Emergency Department Provider Note    First MD Initiated Contact with Patient 07/25/16 [phone]     (approximate)  I have reviewed the triage vital signs and the nursing notes.   HISTORY  Chief Complaint Psychiatric Evaluation    HPI [name] is a 45 y.o. male with below list of chronic medical conditions presents to the emergency department with "feeling unsafe at home. Patient admits to suicidal ideation with plan to "stab" himself in the chest".   Past Medical History:  Diagnosis Date  . Asthma   . COPD (chronic obstructive pulmonary disease) (HCC)   . Diabetes mellitus without complication (HCC)   . Gastric ulcer   . Hypertension   . Myocardial infarction   . Renal disorder   . Thrombocytopenia [facility](HCC)     Patient Active Problem List   Diagnosis Date Noted  . Diabetic foot infection (HCC) 05/08/2015  . DM (diabetes mellitus) (HCC) 05/08/2015  . HTN (hypertension) 05/08/2015  . COPD (chronic obstructive pulmonary disease) (HCC) 05/08/2015    Past Surgical History:  Procedure Laterality Date  . CHOLECYSTECTOMY    . FOOT SURGERY Left   . left below knee amputation      Prior to Admission medications   Medication Sig Start Date End Date Taking? Authorizing Provider  acetaminophen (TYLENOL) 500 MG tablet Take 1,000 mg by mouth every 6 (six) hours as needed for mild pain.  05/09/12  Yes Historical Provider, MD  albuterol (PROAIR HFA) 108 (90 BASE) MCG/ACT inhaler Inhale 2 puffs into the lungs every 4 (four) hours as needed for wheezing or shortness of breath.  08/20/14  Yes Historical Provider, MD  carbamazepine (TEGRETOL) 200 MG tablet Take 1 tablet by mouth 2 (two) times daily.    Yes Historical Provider, MD  carvedilol (COREG) 12.5 MG tablet Take 1 tablet by mouth 2 (two) times daily.   Yes Historical Provider, MD  cholecalciferol (VITAMIN D) 1000 units tablet Take 1,000 Units by mouth daily.   Yes Historical Provider, MD    ferrous sulfate 325 (65 FE) MG EC tablet Take 325 mg by mouth daily with breakfast.   Yes Historical Provider, MD  insulin aspart (NOVOLOG) 100 UNIT/ML injection Inject 50 Units into the skin See admin instructions. Per sliding scale   Yes Historical Provider, MD  insulin glargine (LANTUS) 100 UNIT/ML injection Inject 10 Units into the skin at bedtime.   Yes Historical Provider, MD  isosorbide mononitrate (IMDUR) 30 MG 24 hr tablet Take 1 tablet by mouth 2 (two) times daily.   Yes Historical Provider, MD  morphine (MS CONTIN) 30 MG 12 hr tablet Take 30 mg by mouth every 12 (twelve) hours.   Yes Historical Provider, MD  Multiple Vitamins-Minerals (MULTIVITAMIN WITH MINERALS) tablet Take 1 tablet by mouth daily.   Yes Historical Provider, MD  oxycodone (OXY-IR) 5 MG capsule Take 1 capsule (5 mg total) by mouth every 4 (four) hours as needed. Patient taking differently: Take 15 mg by mouth 3 (three) times daily.  09/26/15  Yes Tommi Rumpshonda L Summers, PA-C  pantoprazole (PROTONIX) 40 MG tablet Take 40 mg by mouth 2 (two) times daily. 10/30/14  Yes Historical Provider, MD  pravastatin (PRAVACHOL) 20 MG tablet Take 1 tablet by mouth at bedtime.   Yes Historical Provider, MD  QUEtiapine (SEROQUEL) 400 MG tablet Take 1 tablet by mouth at bedtime.   Yes Historical Provider, MD  gabapentin (NEURONTIN) 300 MG capsule Take 2 capsules by mouth 3 (three)  times daily. 03/09/15 03/08/16  Historical Provider, MD    Allergies Demerol [meperidine]; Arithmin [antazoline]; and Erythromycin  Family History  Problem Relation Age of Onset  . Adopted: Yes    Social History Social History  Substance Use Topics  . Smoking status: Current Every Day Smoker    Packs/day: 1.00    Types: Cigarettes  . Smokeless tobacco: Never Used  . Alcohol use No    Review of Systems Constitutional: No fever/chills Eyes: No visual changes. ENT: No sore throat. Cardiovascular: Denies chest pain. Respiratory: Denies shortness of  breath. Gastrointestinal: No abdominal pain.  No nausea, no vomiting.  No diarrhea.  No constipation. Genitourinary: Negative for dysuria. Musculoskeletal: Negative for back pain. Skin: Negative for rash. Neurological: Negative for headaches, focal weakness or numbness. Psychiatric:Positive for suicidal ideation  10-point ROS otherwise negative.  ____________________________________________   PHYSICAL EXAM:  VITAL SIGNS: ED Triage Vitals  Enc Vitals Group     BP 07/25/16 0300 126/88     Pulse Rate 07/25/16 0300 64     Resp 07/25/16 0300 15     Temp 07/25/16 0300 97.5 F (36.4 C)     Temp Source 07/25/16 0300 Oral     SpO2 07/25/16 0300 99 %     Weight 07/25/16 0302 185 lb (83.9 kg)     Height 07/25/16 0302 6\' 1"  (1.854 m)     Head Circumference --      Peak Flow --      Pain Score 07/25/16 0318 6     Pain Loc --      Pain Edu? --      Excl. in GC? --     Constitutional: Alert and oriented. Well appearing and in no acute distress. Eyes: Conjunctivae are normal. PERRL. EOMI. Head: Atraumatic. Mouth/Throat: Mucous membranes are moist.  Oropharynx non-erythematous. Neck: No stridor. Cardiovascular: Normal rate, regular rhythm. Good peripheral circulation. Grossly normal heart sounds. Respiratory: Normal respiratory effort.  No retractions. Lungs CTAB. Gastrointestinal: Soft and nontender. No distention.  Musculoskeletal: Left leg BKA No lower extremity tenderness nor edema. No gross deformities of extremities. Neurologic:  Normal speech and language. No gross focal neurologic deficits are appreciated.  Skin:  Skin is warm, dry and intact. No rash noted. Psychiatric: Agitated mood, occasionally tearful  ____________________________________________   LABS (all labs ordered are listed, but only abnormal results are displayed)  Labs Reviewed  COMPREHENSIVE METABOLIC PANEL - Abnormal; Notable for the following:       Result Value   Sodium 134 (*)    Chloride 98 (*)     Glucose, Bld 121 (*)    Total Protein 8.2 (*)    ALT 12 (*)    Alkaline Phosphatase 148 (*)    All other components within normal limits  ETHANOL - Abnormal; Notable for the following:    Alcohol, Ethyl (B) 140 (*)    All other components within normal limits  ACETAMINOPHEN LEVEL - Abnormal; Notable for the following:    Acetaminophen (Tylenol), Serum <10 (*)    All other components within normal limits  CBC - Abnormal; Notable for the following:    RBC 3.87 (*)    MCV 105.7 (*)    MCH 36.4 (*)    All other components within normal limits  URINE DRUG SCREEN, QUALITATIVE ([facility] ONLY) - Abnormal; Notable for the following:    Benzodiazepine, Ur Scrn POSITIVE (*)    All other components within normal limits  SALICYLATE LEVEL   ____________________________________________  EKG  ED ECG REPORT I, Celina N Braulio Kiedrowski, the attending physician, personally viewed and interpreted this ECG.   Date: 07/25/2016  EKG Time: 5:33 AM  Rate: 63  Rhythm: Normal sinus rhythm  Axis: normal  Intervals:normal  ST&T Change: none    Procedures    INITIAL IMPRESSION / ASSESSMENT AND PLAN / ED COURSE  Pertinent labs & imaging results that were available during my care of the patient were reviewed by me and considered in my medical decision making (see chart for details).  Patient stated approximately 5:30 AM that he was feeling very agitated and requesting Haldol   Clinical Course     ____________________________________________  FINAL CLINICAL IMPRESSION(S) / ED DIAGNOSES  Final diagnoses:  Suicidal ideation     MEDICATIONS GIVEN DURING THIS VISIT:  Medications  nitroGLYCERIN (NITROSTAT) SL tablet 0.4 mg (0.4 mg Sublingual Given 07/25/16 0436)  diphenhydrAMINE (BENADRYL) injection 50 mg (50 mg Intramuscular Given 07/25/16 0602)  haloperidol lactate (HALDOL) injection 5 mg (5 mg Intramuscular Given 07/25/16 0605)     NEW OUTPATIENT MEDICATIONS STARTED DURING THIS  VISIT:  New Prescriptions   No medications on file    Modified Medications   No medications on file    Discontinued Medications   DOCUSATE SODIUM (COLACE) 100 MG CAPSULE    Take 100 mg by mouth 2 (two) times daily as needed for moderate constipation.    HYDROMORPHONE (DILAUDID) 2 MG TABLET    Take 2 mg by mouth every 4 (four) hours as needed for severe pain.   ONDANSETRON (ZOFRAN) 4 MG TABLET    Take 1 tablet by mouth as needed.   PROMETHAZINE (PHENERGAN) 25 MG TABLET    Take 1 tablet (25 mg total) by mouth every 6 (six) hours as needed for nausea or vomiting.   RIVAROXABAN (XARELTO) 20 MG TABS TABLET    Take 20 mg by mouth daily.    SUCRALFATE (CARAFATE) 1 G TABLET    Take 1 g by mouth daily as needed.    TAPENTADOL HCL (NUCYNTA ER) 200 MG TB12    Take 1 tablet by mouth every 12 (twelve) hours.   TIOTROPIUM (SPIRIVA) 18 MCG INHALATION CAPSULE    Place 1 capsule into inhaler and inhale 2 (two) times daily.      Note:  This document was prepared using Dragon voice recognition software and may include unintentional dictation errors.    Darci Currentandolph N Eternity Dexter, MD 07/25/16 [phone]
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
## 2016-08-26 IMAGING — CR RIGHT FOOT COMPLETE - 3+ VIEW
1 series · 3 of 3 positions shown · non-contrast
Comparison: 05/13/2014.

CLINICAL DATA: Pain and swelling.  Recent surgery.

EXAM:
RIGHT FOOT COMPLETE - 3+ VIEW

[Series 1: ap · 0.17mm/px · 3 of 3 slices shown]
[im 1/3]
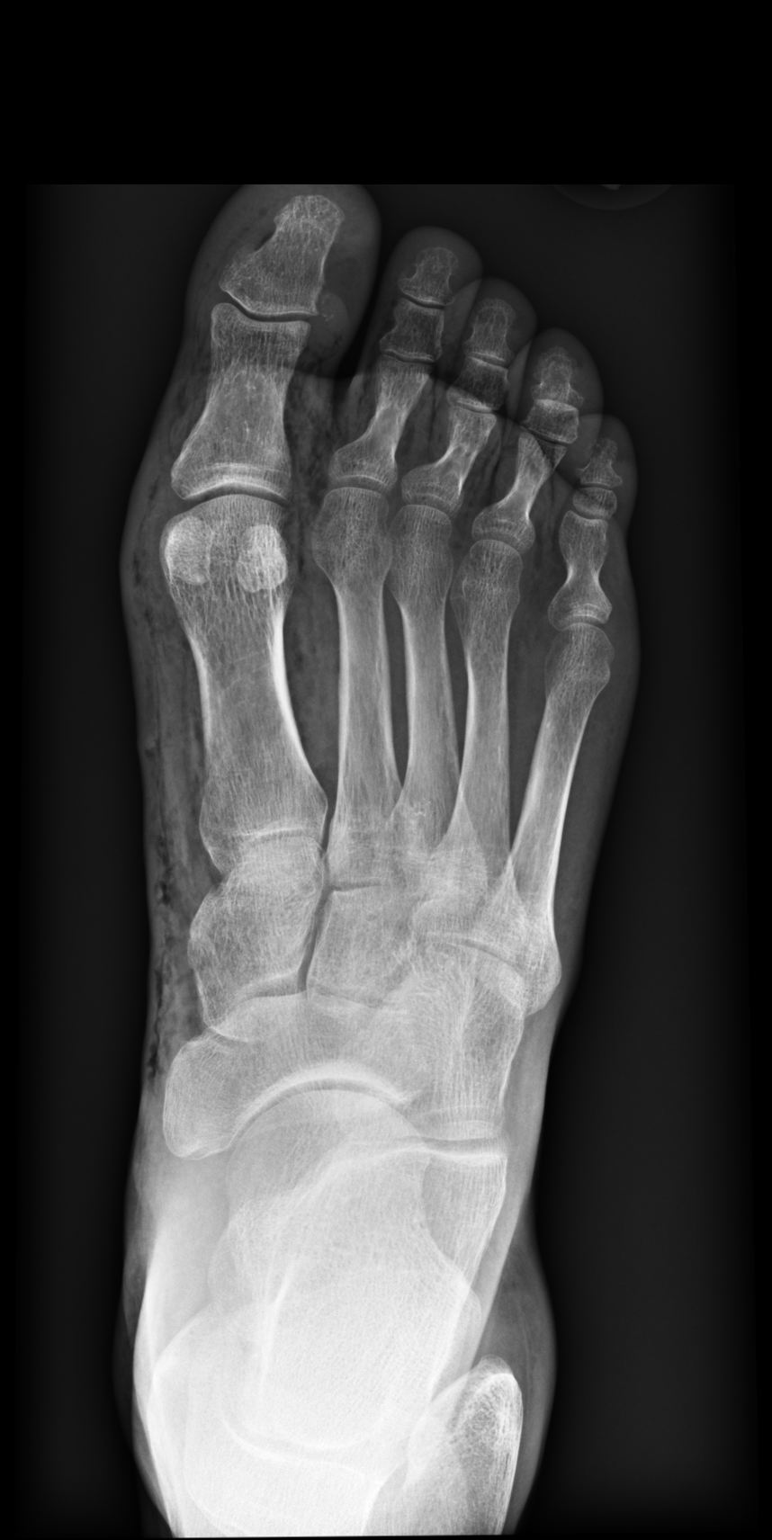
[im 2/3]
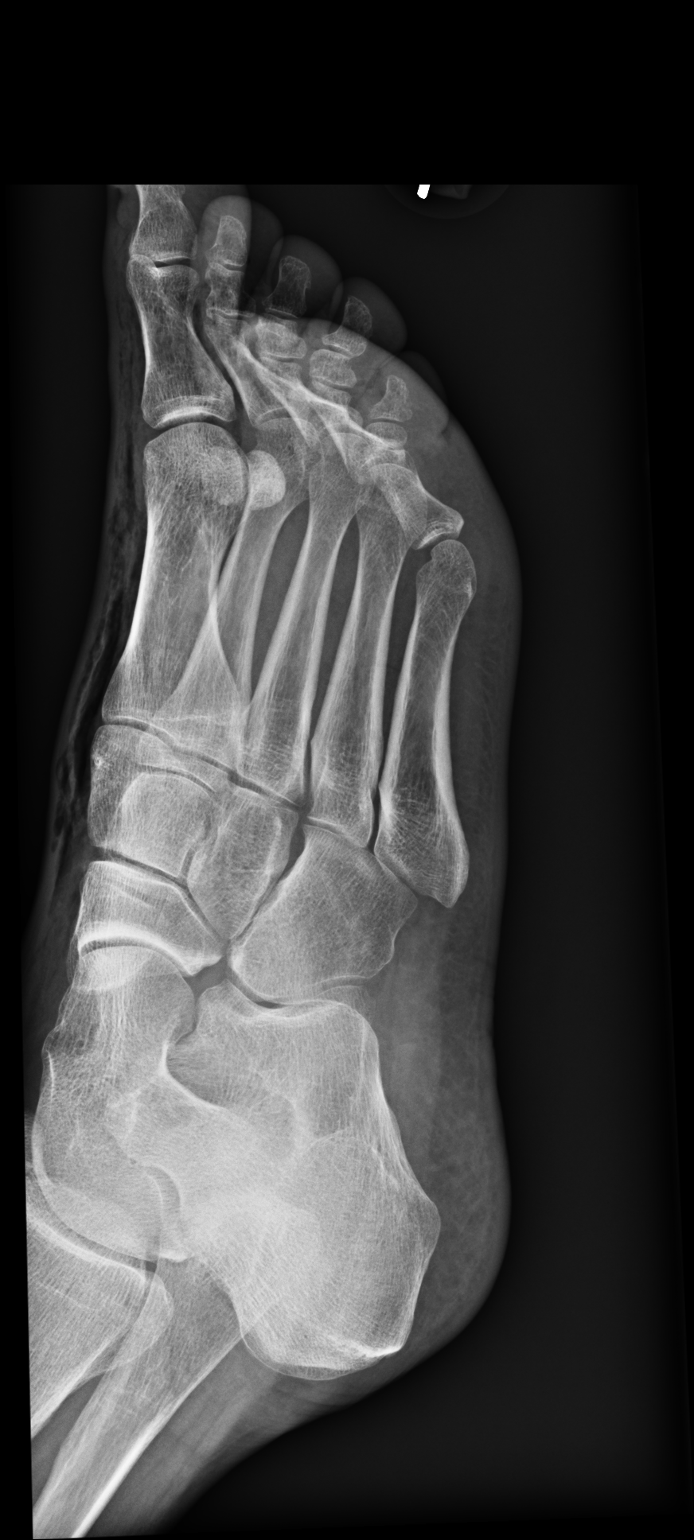
[im 3/3]
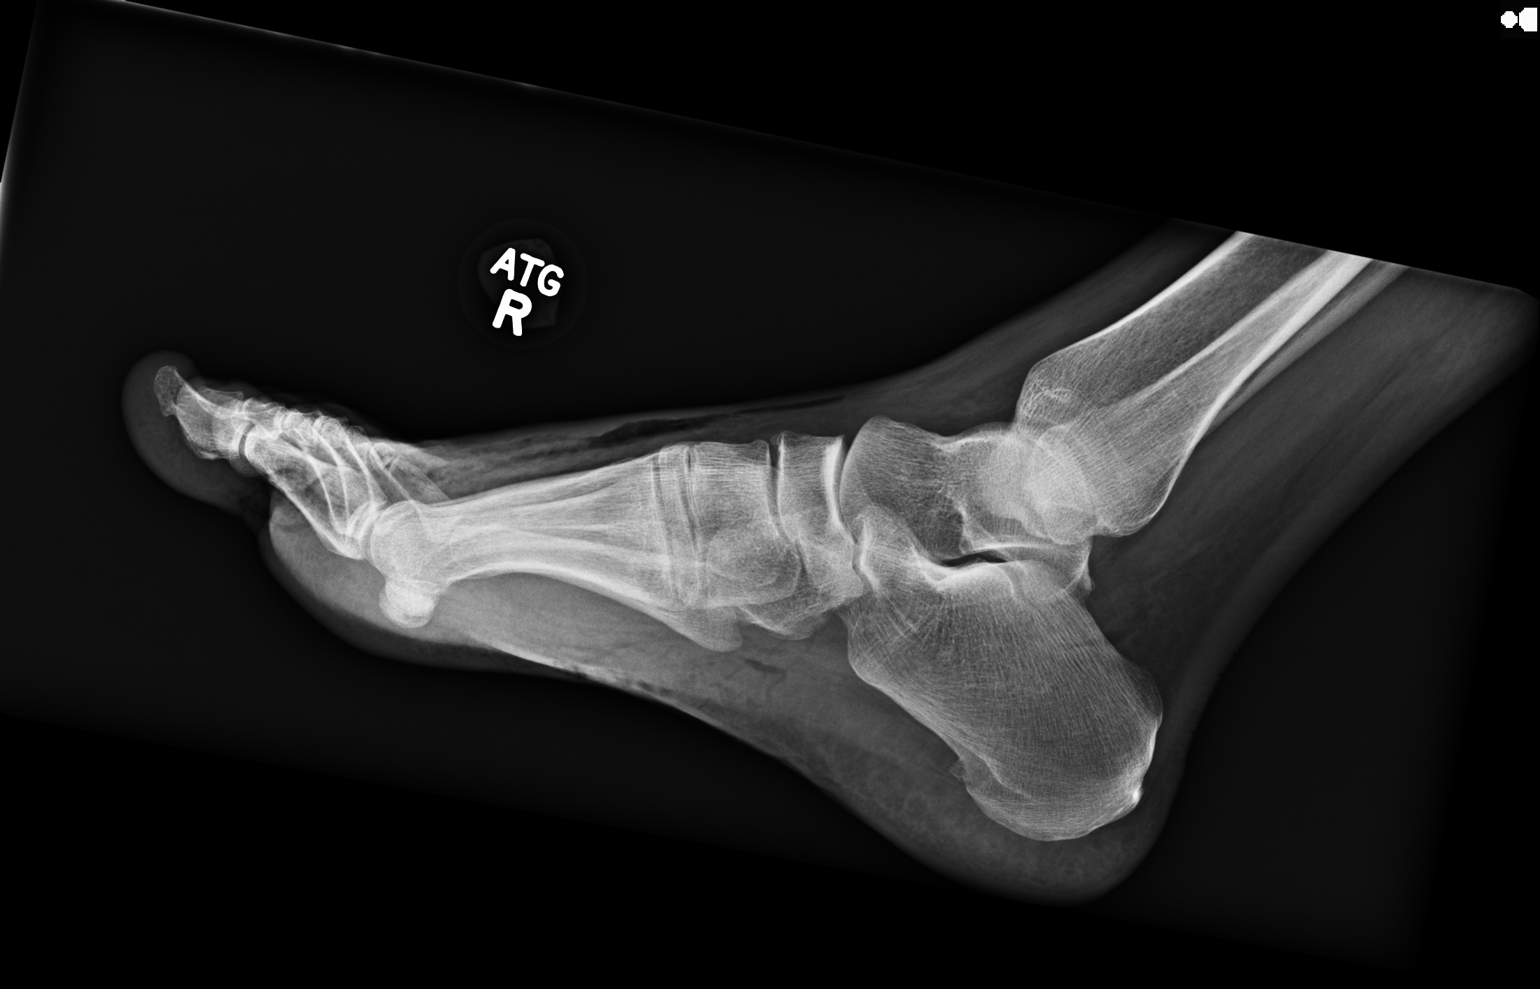

[3 of 3 positions shown; findings below may reference images not displayed]

FINDINGS: Three views study of the right foot shows no evidence for fracture.
No subluxation or dislocation. No worrisome lytic or sclerotic
osseous abnormality. There is fairly extensive soft tissue gas
involving the medial midfoot and the first, second, and third toes.
No underlying bony destruction to suggest associated osteomyelitis
at this time.
IMPRESSION: Extensive soft tissue gas in the mid foot and toes. This is more gas
than would be expected for a focal surgery, especially if surgery
was more than 10 days ago. Imaging features suggest infection and
necrotizing fasciitis is not excluded.

## 2016-10-11 DIAGNOSIS — S22080A Wedge compression fracture of T11-T12 vertebra, initial encounter for closed fracture: Secondary | ICD-10-CM | POA: Diagnosis not present

## 2016-10-11 DIAGNOSIS — I10 Essential (primary) hypertension: Secondary | ICD-10-CM | POA: Diagnosis not present

## 2016-10-11 DIAGNOSIS — W009XXA Unspecified fall due to ice and snow, initial encounter: Secondary | ICD-10-CM | POA: Diagnosis not present

## 2016-10-11 DIAGNOSIS — Z8673 Personal history of transient ischemic attack (TIA), and cerebral infarction without residual deficits: Secondary | ICD-10-CM | POA: Diagnosis not present

## 2016-10-11 DIAGNOSIS — S32020A Wedge compression fracture of second lumbar vertebra, initial encounter for closed fracture: Secondary | ICD-10-CM | POA: Diagnosis not present

## 2016-10-11 DIAGNOSIS — M545 Low back pain: Secondary | ICD-10-CM | POA: Diagnosis not present

## 2016-10-11 DIAGNOSIS — M5134 Other intervertebral disc degeneration, thoracic region: Secondary | ICD-10-CM | POA: Diagnosis not present

## 2016-10-11 DIAGNOSIS — J449 Chronic obstructive pulmonary disease, unspecified: Secondary | ICD-10-CM | POA: Diagnosis not present

## 2016-10-11 DIAGNOSIS — E119 Type 2 diabetes mellitus without complications: Secondary | ICD-10-CM | POA: Diagnosis not present

## 2016-10-11 DIAGNOSIS — M47814 Spondylosis without myelopathy or radiculopathy, thoracic region: Secondary | ICD-10-CM | POA: Diagnosis not present

## 2016-10-11 DIAGNOSIS — I252 Old myocardial infarction: Secondary | ICD-10-CM | POA: Diagnosis not present

## 2016-12-08 DIAGNOSIS — G8929 Other chronic pain: Secondary | ICD-10-CM | POA: Diagnosis not present

## 2016-12-08 DIAGNOSIS — M25462 Effusion, left knee: Secondary | ICD-10-CM | POA: Diagnosis not present

## 2016-12-08 DIAGNOSIS — W07XXXA Fall from chair, initial encounter: Secondary | ICD-10-CM | POA: Diagnosis not present

## 2016-12-08 DIAGNOSIS — I110 Hypertensive heart disease with heart failure: Secondary | ICD-10-CM | POA: Diagnosis not present

## 2016-12-08 DIAGNOSIS — R69 Illness, unspecified: Secondary | ICD-10-CM | POA: Diagnosis not present

## 2016-12-08 DIAGNOSIS — I5022 Chronic systolic (congestive) heart failure: Secondary | ICD-10-CM | POA: Diagnosis not present

## 2016-12-08 DIAGNOSIS — E119 Type 2 diabetes mellitus without complications: Secondary | ICD-10-CM | POA: Diagnosis not present

## 2016-12-08 DIAGNOSIS — J449 Chronic obstructive pulmonary disease, unspecified: Secondary | ICD-10-CM | POA: Diagnosis not present

## 2016-12-08 DIAGNOSIS — M25562 Pain in left knee: Secondary | ICD-10-CM | POA: Diagnosis not present

## 2016-12-08 DIAGNOSIS — R2242 Localized swelling, mass and lump, left lower limb: Secondary | ICD-10-CM | POA: Diagnosis not present

## 2016-12-08 DIAGNOSIS — Z89519 Acquired absence of unspecified leg below knee: Secondary | ICD-10-CM | POA: Diagnosis not present

## 2016-12-08 DIAGNOSIS — I2510 Atherosclerotic heart disease of native coronary artery without angina pectoris: Secondary | ICD-10-CM | POA: Diagnosis not present

## 2016-12-09 ENCOUNTER — Emergency Department: Payer: Medicare HMO

## 2016-12-09 ENCOUNTER — Encounter: Payer: Self-pay | Admitting: Emergency Medicine

## 2016-12-09 DIAGNOSIS — W050XXA Fall from non-moving wheelchair, initial encounter: Secondary | ICD-10-CM | POA: Insufficient documentation

## 2016-12-09 DIAGNOSIS — R2241 Localized swelling, mass and lump, right lower limb: Secondary | ICD-10-CM | POA: Diagnosis not present

## 2016-12-09 DIAGNOSIS — J449 Chronic obstructive pulmonary disease, unspecified: Secondary | ICD-10-CM | POA: Diagnosis not present

## 2016-12-09 DIAGNOSIS — F17210 Nicotine dependence, cigarettes, uncomplicated: Secondary | ICD-10-CM | POA: Insufficient documentation

## 2016-12-09 DIAGNOSIS — R2242 Localized swelling, mass and lump, left lower limb: Secondary | ICD-10-CM | POA: Diagnosis not present

## 2016-12-09 DIAGNOSIS — Z794 Long term (current) use of insulin: Secondary | ICD-10-CM | POA: Diagnosis not present

## 2016-12-09 DIAGNOSIS — I252 Old myocardial infarction: Secondary | ICD-10-CM | POA: Insufficient documentation

## 2016-12-09 DIAGNOSIS — I10 Essential (primary) hypertension: Secondary | ICD-10-CM | POA: Insufficient documentation

## 2016-12-09 DIAGNOSIS — Z89512 Acquired absence of left leg below knee: Secondary | ICD-10-CM | POA: Diagnosis not present

## 2016-12-09 DIAGNOSIS — M726 Necrotizing fasciitis: Secondary | ICD-10-CM | POA: Insufficient documentation

## 2016-12-09 DIAGNOSIS — Y999 Unspecified external cause status: Secondary | ICD-10-CM | POA: Insufficient documentation

## 2016-12-09 DIAGNOSIS — L03116 Cellulitis of left lower limb: Secondary | ICD-10-CM | POA: Diagnosis not present

## 2016-12-09 DIAGNOSIS — Z89519 Acquired absence of unspecified leg below knee: Secondary | ICD-10-CM | POA: Diagnosis not present

## 2016-12-09 DIAGNOSIS — Z79899 Other long term (current) drug therapy: Secondary | ICD-10-CM | POA: Insufficient documentation

## 2016-12-09 DIAGNOSIS — M25562 Pain in left knee: Secondary | ICD-10-CM | POA: Diagnosis not present

## 2016-12-09 DIAGNOSIS — S8992XA Unspecified injury of left lower leg, initial encounter: Secondary | ICD-10-CM | POA: Diagnosis not present

## 2016-12-09 DIAGNOSIS — R69 Illness, unspecified: Secondary | ICD-10-CM | POA: Diagnosis not present

## 2016-12-09 DIAGNOSIS — Y939 Activity, unspecified: Secondary | ICD-10-CM | POA: Diagnosis not present

## 2016-12-09 DIAGNOSIS — E119 Type 2 diabetes mellitus without complications: Secondary | ICD-10-CM | POA: Insufficient documentation

## 2016-12-09 DIAGNOSIS — Y92481 Parking lot as the place of occurrence of the external cause: Secondary | ICD-10-CM | POA: Diagnosis not present

## 2016-12-09 DIAGNOSIS — S8991XA Unspecified injury of right lower leg, initial encounter: Secondary | ICD-10-CM | POA: Diagnosis not present

## 2016-12-09 DIAGNOSIS — M79662 Pain in left lower leg: Secondary | ICD-10-CM | POA: Diagnosis not present

## 2016-12-09 DIAGNOSIS — M25462 Effusion, left knee: Secondary | ICD-10-CM | POA: Diagnosis not present
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
## 2016-12-10 ENCOUNTER — Emergency Department: Payer: Medicare HMO

## 2016-12-10 ENCOUNTER — Emergency Department
Admission: EM | Admit: 2016-12-10 | Discharge: 2016-12-10 | Disposition: A | Discharge status: Other Acute Inpatient Hospital | Payer: Medicare HMO | Attending: Emergency Medicine | Admitting: Emergency Medicine

## 2016-12-10 ENCOUNTER — Encounter: Payer: Self-pay | Admitting: Radiology

## 2016-12-10 DIAGNOSIS — M79605 Pain in left leg: Secondary | ICD-10-CM | POA: Diagnosis not present

## 2016-12-10 DIAGNOSIS — M25562 Pain in left knee: Secondary | ICD-10-CM | POA: Diagnosis not present

## 2016-12-10 DIAGNOSIS — F17210 Nicotine dependence, cigarettes, uncomplicated: Secondary | ICD-10-CM | POA: Diagnosis not present

## 2016-12-10 DIAGNOSIS — Z01818 Encounter for other preprocedural examination: Secondary | ICD-10-CM | POA: Diagnosis not present

## 2016-12-10 DIAGNOSIS — Z89512 Acquired absence of left leg below knee: Secondary | ICD-10-CM

## 2016-12-10 DIAGNOSIS — J438 Other emphysema: Secondary | ICD-10-CM | POA: Diagnosis not present

## 2016-12-10 DIAGNOSIS — Z01810 Encounter for preprocedural cardiovascular examination: Secondary | ICD-10-CM | POA: Diagnosis not present

## 2016-12-10 DIAGNOSIS — M25462 Effusion, left knee: Secondary | ICD-10-CM | POA: Diagnosis not present

## 2016-12-10 DIAGNOSIS — Z720 Tobacco use: Secondary | ICD-10-CM | POA: Diagnosis not present

## 2016-12-10 DIAGNOSIS — R509 Fever, unspecified: Secondary | ICD-10-CM | POA: Diagnosis not present

## 2016-12-10 DIAGNOSIS — T797XXA Traumatic subcutaneous emphysema, initial encounter: Secondary | ICD-10-CM | POA: Diagnosis not present

## 2016-12-10 DIAGNOSIS — I252 Old myocardial infarction: Secondary | ICD-10-CM | POA: Diagnosis not present

## 2016-12-10 DIAGNOSIS — I10 Essential (primary) hypertension: Secondary | ICD-10-CM | POA: Diagnosis not present

## 2016-12-10 DIAGNOSIS — M726 Necrotizing fasciitis: Secondary | ICD-10-CM | POA: Diagnosis not present

## 2016-12-10 DIAGNOSIS — G8929 Other chronic pain: Secondary | ICD-10-CM | POA: Diagnosis not present

## 2016-12-10 DIAGNOSIS — R69 Illness, unspecified: Secondary | ICD-10-CM | POA: Diagnosis not present

## 2016-12-10 DIAGNOSIS — W050XXA Fall from non-moving wheelchair, initial encounter: Secondary | ICD-10-CM | POA: Diagnosis not present

## 2016-12-10 DIAGNOSIS — R2241 Localized swelling, mass and lump, right lower limb: Secondary | ICD-10-CM | POA: Diagnosis not present

## 2016-12-10 DIAGNOSIS — L03116 Cellulitis of left lower limb: Secondary | ICD-10-CM

## 2016-12-10 DIAGNOSIS — E119 Type 2 diabetes mellitus without complications: Secondary | ICD-10-CM | POA: Diagnosis not present

## 2016-12-10 DIAGNOSIS — T8789 Other complications of amputation stump: Secondary | ICD-10-CM | POA: Diagnosis not present

## 2016-12-10 DIAGNOSIS — S8992XA Unspecified injury of left lower leg, initial encounter: Secondary | ICD-10-CM | POA: Diagnosis not present

## 2016-12-10 DIAGNOSIS — S8991XA Unspecified injury of right lower leg, initial encounter: Secondary | ICD-10-CM | POA: Diagnosis not present

## 2016-12-10 DIAGNOSIS — Z794 Long term (current) use of insulin: Secondary | ICD-10-CM | POA: Diagnosis not present

## 2016-12-10 DIAGNOSIS — I82409 Acute embolism and thrombosis of unspecified deep veins of unspecified lower extremity: Secondary | ICD-10-CM | POA: Diagnosis not present

## 2016-12-10 DIAGNOSIS — R21 Rash and other nonspecific skin eruption: Secondary | ICD-10-CM | POA: Diagnosis not present

## 2016-12-10 DIAGNOSIS — M25469 Effusion, unspecified knee: Secondary | ICD-10-CM | POA: Diagnosis not present

## 2016-12-10 HISTORY — DX: Systemic involvement of connective tissue, unspecified: M35.9

## 2016-12-10 LAB — CBC WITH DIFFERENTIAL/PLATELET
BASOS ABS: 0.1 10*3/uL (ref 0–0.1)
Basophils Relative: 1 %
EOS ABS: 0.6 10*3/uL (ref 0–0.7)
Eosinophils Relative: 9 %
HCT: 33.9 % — ABNORMAL LOW (ref 40.0–52.0)
HEMOGLOBIN: 11.9 g/dL — AB (ref 13.0–18.0)
LYMPHS ABS: 2.2 10*3/uL (ref 1.0–3.6)
LYMPHS PCT: 32 %
MCH: 36.1 pg — AB (ref 26.0–34.0)
MCHC: 35.1 g/dL (ref 32.0–36.0)
MCV: 102.9 fL — ABNORMAL HIGH (ref 80.0–100.0)
Monocytes Absolute: 0.5 10*3/uL (ref 0.2–1.0)
Monocytes Relative: 8 %
NEUTROS PCT: 50 %
Neutro Abs: 3.3 10*3/uL (ref 1.4–6.5)
PLATELETS: 238 10*3/uL (ref 150–440)
RBC: 3.3 MIL/uL — AB (ref 4.40–5.90)
RDW: 13.5 % (ref 11.5–14.5)
WBC: 6.7 10*3/uL (ref 3.8–10.6)

## 2016-12-10 LAB — BASIC METABOLIC PANEL
ANION GAP: 6 (ref 5–15)
BUN: 17 mg/dL (ref 6–20)
CHLORIDE: 101 mmol/L (ref 101–111)
CO2: 29 mmol/L (ref 22–32)
Calcium: 8.9 mg/dL (ref 8.9–10.3)
Creatinine, Ser: 0.86 mg/dL (ref 0.61–1.24)
GFR calc Af Amer: >60 mL/min (ref >60)
GFR calc non Af Amer: >60 mL/min (ref >60)
Glucose, Bld: 97 mg/dL (ref 65–99)
POTASSIUM: 4.1 mmol/L (ref 3.5–5.1)
SODIUM: 136 mmol/L (ref 135–145)

## 2016-12-10 LAB — LACTIC ACID, PLASMA: LACTIC ACID, VENOUS: 0.9 mmol/L (ref 0.5–1.9)

## 2016-12-10 MED ORDER — SODIUM CHLORIDE 0.9 % IV BOLUS (SEPSIS)
1000.0000 mL | Freq: Once | INTRAVENOUS | Status: AC
  Administered 2016-12-10: 1000 mL via INTRAVENOUS

## 2016-12-10 MED ORDER — MORPHINE SULFATE (PF) 4 MG/ML IV SOLN
4.0000 mg | Freq: Once | INTRAVENOUS | Status: AC
  Administered 2016-12-10: 4 mg via INTRAVENOUS
  Filled 2016-12-10: qty 1

## 2016-12-10 MED ORDER — CLINDAMYCIN PHOSPHATE 600 MG/50ML IV SOLN
600.0000 mg | Freq: Once | INTRAVENOUS | Status: AC
  Administered 2016-12-10: 600 mg via INTRAVENOUS
  Filled 2016-12-10: qty 50

## 2016-12-10 MED ORDER — PIPERACILLIN-TAZOBACTAM 3.375 G IVPB: 3.3750 g | Freq: Once | INTRAVENOUS | Status: DC

## 2016-12-10 MED ORDER — DIPHENHYDRAMINE HCL 50 MG/ML IJ SOLN
25.0000 mg | Freq: Once | INTRAMUSCULAR | Status: AC
  Administered 2016-12-10: 25 mg via INTRAVENOUS
  Filled 2016-12-10: qty 1

## 2016-12-10 MED ORDER — IOPAMIDOL (ISOVUE-300) INJECTION 61%
100.0000 mL | Freq: Once | INTRAVENOUS | Status: AC | PRN
  Administered 2016-12-10: 100 mL via INTRAVENOUS

## 2016-12-10 MED ORDER — HYDROMORPHONE HCL 1 MG/ML IJ SOLN
1.0000 mg | Freq: Once | INTRAMUSCULAR | Status: AC
  Administered 2016-12-10: 1 mg via INTRAVENOUS
  Filled 2016-12-10: qty 1

## 2016-12-10 MED ORDER — SODIUM CHLORIDE 0.9 % IV SOLN
1.0000 g | Freq: Once | INTRAVENOUS | Status: AC
  Administered 2016-12-10: 1 g via INTRAVENOUS
  Filled 2016-12-10: qty 1

## 2016-12-10 MED ORDER — VANCOMYCIN HCL IN DEXTROSE 1-5 GM/200ML-% IV SOLN
1000.0000 mg | Freq: Once | INTRAVENOUS | Status: AC
  Administered 2016-12-10: 1000 mg via INTRAVENOUS
  Filled 2016-12-10: qty 200

## 2016-12-10 NOTE — Consult Note (Signed)
Date of Consultation:  12/10/2016  Requesting Physician:  Chiquita Loth, MD  Reason for Consultation:  Left BKA stump infection  History of Present Illness: [name] is a 46 y.o. male who presents with a 2 day history of worsening pain over his left BKA stump.  The patient reports that he had an initial left BKA done 12/2014, which required a revision in 2017.  Reviewing his records, he had an initial soft tissue infection of the lower leg which required guillotine amputation with subsequent revision to a standard amputation.  He reports needing another revision soon because the femur stump is pushing against the anterior portion of the skin too much and causes discomfort when wearing the prosthetic.    The patient reports he fell yesterday as well prior to the onset of pain.  He was transferring from his recliner to the wheelchair when he slipped and fell.  He reports significant pain going from the end of the stump proximally towards the lower third of the left thigh.  He also has noticed more swelling of that same area.  Denies any ulcerations or wounds or areas of drainage.  He does scratch his skin in the lower portion of the stump due to itchiness from the prosthetic sock.  Denies anything that makes the pain better or worse.  He does have chronic pain and takes Oxycodone TID, but this has not helped.  In the ED, he had labs and imaging workup showing a normal WBC of 6.7 with no shift, and a lactic acid of 0.9.  However, his XR of left knee and CT scan of the left leg show extensive soft tissue gas from the stump going proximally to the mid thigh.  General surgery was consulted for further evaluation and possible management options.   Past Medical History: Past Medical History:  Diagnosis Date  . Asthma   . Collagen vascular disease (HCC)   . COPD (chronic obstructive pulmonary disease) (HCC)   . Diabetes mellitus without complication (HCC)   . Gastric ulcer   . Hypertension   .  Myocardial infarction   . Renal disorder   . Thrombocytopenia (HCC)      Past Surgical History: Past Surgical History:  Procedure Laterality Date  . CHOLECYSTECTOMY    . FOOT SURGERY Left   . left below knee amputation and stump revision      Home Medications: Prior to Admission medications   Medication Sig Start Date End Date Taking? Authorizing Provider  acetaminophen (TYLENOL) 500 MG tablet Take 1,000 mg by mouth every 6 (six) hours as needed for mild pain.  05/09/12   Historical Provider, MD  albuterol (PROAIR HFA) 108 (90 BASE) MCG/ACT inhaler Inhale 2 puffs into the lungs every 4 (four) hours as needed for wheezing or shortness of breath.  08/20/14   Historical Provider, MD  carbamazepine (TEGRETOL) 200 MG tablet Take 1 tablet by mouth 2 (two) times daily.     Historical Provider, MD  carvedilol (COREG) 12.5 MG tablet Take 1 tablet by mouth 2 (two) times daily.    Historical Provider, MD  cholecalciferol (VITAMIN D) 1000 units tablet Take 1,000 Units by mouth daily.    Historical Provider, MD  ferrous sulfate 325 (65 FE) MG EC tablet Take 325 mg by mouth daily with breakfast.    Historical Provider, MD  gabapentin (NEURONTIN) 300 MG capsule Take 2 capsules by mouth 3 (three) times daily. 03/09/15 03/08/16  Historical Provider, MD  insulin aspart (NOVOLOG) 100 UNIT/ML  injection Inject 50 Units into the skin See admin instructions. Per sliding scale    Historical Provider, MD  insulin glargine (LANTUS) 100 UNIT/ML injection Inject 10 Units into the skin at bedtime.    Historical Provider, MD  isosorbide mononitrate (IMDUR) 30 MG 24 hr tablet Take 1 tablet by mouth 2 (two) times daily.    Historical Provider, MD  morphine (MS CONTIN) 30 MG 12 hr tablet Take 30 mg by mouth every 12 (twelve) hours.    Historical Provider, MD  Multiple Vitamins-Minerals (MULTIVITAMIN WITH MINERALS) tablet Take 1 tablet by mouth daily.    Historical Provider, MD  oxycodone (OXY-IR) 5 MG capsule Take 1 capsule  (5 mg total) by mouth every 4 (four) hours as needed. Patient taking differently: Take 15 mg by mouth 3 (three) times daily.  09/26/15   Tommi Rumps, PA-C  pantoprazole (PROTONIX) 40 MG tablet Take 40 mg by mouth 2 (two) times daily. 10/30/14   Historical Provider, MD  pravastatin (PRAVACHOL) 20 MG tablet Take 1 tablet by mouth at bedtime.    Historical Provider, MD  QUEtiapine (SEROQUEL) 400 MG tablet Take 1 tablet by mouth at bedtime.    Historical Provider, MD    Allergies: Allergies  Allergen Reactions  . Demerol [Meperidine] Anaphylaxis  . Arithmin [Antazoline]   . Erythromycin     Social History:  reports that he has been smoking Cigarettes.  He has been smoking about 1.00 pack per day. He has never used smokeless tobacco. He reports that he does not drink alcohol or use drugs.   Family History: Family History  Problem Relation Age of Onset  . Adopted: Yes    Review of Systems: Review of Systems  Constitutional: Negative for chills and fever.  HENT: Negative for hearing loss.   Eyes: Negative for blurred vision.  Respiratory: Negative for cough and shortness of breath.   Cardiovascular: Positive for leg swelling. Negative for chest pain.  Gastrointestinal: Negative for abdominal pain, heartburn, nausea and vomiting.  Genitourinary: Negative for dysuria and hematuria.  Musculoskeletal: Positive for falls.  Skin: Positive for itching.  Neurological: Negative for dizziness.  Psychiatric/Behavioral: Negative for depression.  All other systems reviewed and are negative.   Physical Exam BP (!) 132/98   Pulse 87   Temp 98 F (36.7 C) (Oral)   Resp 16   Ht 6\' 1"  (1.854 m)   Wt 83.9 kg (185 lb)   SpO2 99%   BMI 24.41 kg/m  CONSTITUTIONAL: No acute distress HEENT:  Normocephalic, atraumatic, extraocular motion intact. NECK: Trachea is midline, and there is no jugular venous distension.  RESPIRATORY:  Lungs are clear, and breath sounds are equal bilaterally. Normal  respiratory effort without pathologic use of accessory muscles. CARDIOVASCULAR: Heart is regular without murmurs, gallops, or rubs. GI: The abdomen is soft, nondistended, nontender.  MUSCULOSKELETAL:  s/p left below the knee amputation.  Prior incision is clean and dry.  There are no ulcers or lacerations or areas of drainage.  There is streaking cellulitis around the stump and the knee joint, with swelling and likely left knee effusion.  There is tenderness to palpation going from distal third of thigh to distal stump.  There are mild abrasions on the medial aspect of stump consistent with the scratching patient reports. SKIN: Skin turgor is normal. There are no pathologic skin lesions.  NEUROLOGIC:  Motor and sensation is grossly normal.  Cranial nerves are grossly intact. PSYCH:  Alert and oriented to person, place and time.  Affect is normal.  Laboratory Analysis: Results for orders placed or performed during the hospital encounter of 12/10/16 (from the past 24 hour(s))  CBC with Differential     Status: Abnormal   Collection Time: 12/10/16  2:24 AM  Result Value Ref Range   WBC 6.7 3.8 - 10.6 K/uL   RBC 3.30 (L) 4.40 - 5.90 MIL/uL   Hemoglobin 11.9 (L) 13.0 - 18.0 g/dL   HCT 78.433.9 (L) 69.640.0 - 29.552.0 %   MCV 102.9 (H) 80.0 - 100.0 fL   MCH 36.1 (H) 26.0 - 34.0 pg   MCHC 35.1 32.0 - 36.0 g/dL   RDW 28.413.5 13.211.5 - 44.014.5 %   Platelets 238 150 - 440 K/uL   Neutrophils Relative % 50 %   Neutro Abs 3.3 1.4 - 6.5 K/uL   Lymphocytes Relative 32 %   Lymphs Abs 2.2 1.0 - 3.6 K/uL   Monocytes Relative 8 %   Monocytes Absolute 0.5 0.2 - 1.0 K/uL   Eosinophils Relative 9 %   Eosinophils Absolute 0.6 0 - 0.7 K/uL   Basophils Relative 1 %   Basophils Absolute 0.1 0 - 0.1 K/uL  Basic metabolic panel     Status: None   Collection Time: 12/10/16  2:24 AM  Result Value Ref Range   Sodium 136 135 - 145 mmol/L   Potassium 4.1 3.5 - 5.1 mmol/L   Chloride 101 101 - 111 mmol/L   CO2 29 22 - 32 mmol/L    Glucose, Bld 97 65 - 99 mg/dL   BUN 17 6 - 20 mg/dL   Creatinine, Ser 1.020.86 0.61 - 1.24 mg/dL   Calcium 8.9 8.9 - 72.510.3 mg/dL   GFR calc non Af Amer >60 >60 mL/min   GFR calc Af Amer >60 >60 mL/min   Anion gap 6 5 - 15  Lactic acid, plasma     Status: None   Collection Time: 12/10/16  2:24 AM  Result Value Ref Range   Lactic Acid, Venous 0.9 0.5 - 1.9 mmol/L    Imaging: Ct Femur Left W Contrast  Result Date: 12/10/2016 CLINICAL DATA:  Necrotizing fasciitis. BKA with swelling post fall out of wheelchair. EXAM: CT OF THE LOWER RIGHT EXTREMITY WITH CONTRAST TECHNIQUE: Multidetector CT imaging of the lower right extremity was performed according to the standard protocol following intravenous contrast administration. COMPARISON:  Radiographs earlier this day. CONTRAST:  100mL ISOVUE-300 IOPAMIDOL (ISOVUE-300) INJECTION 61% FINDINGS: Bones/Joint/Cartilage Left below-the-knee amputation. Resection margins are smooth. No acute fracture. No bony destructive change or periosteal reaction. Mild left knee osteoarthritis. There is a small suprapatellar joint effusion. Ligaments Suboptimally assessed by CT. Muscles and Tendons Large volume soft tissue air tracks along the facial and muscle planes of the stump into the thigh. Proximal extent of soft tissue air at the mid femur. No definite muscle non enhancement. No focal fluid collection or abscess. Soft tissues Tracking soft tissue air from the stump proximally into the mid thigh. No definite facial thickening or areas of nonenhancement. No definite soft tissue stranding. No soft tissue fluid collection. IMPRESSION: Large volume of tracking soft tissue air about the facial planes of the BKA stump into the thigh. This can be secondary to laceration in the setting of fall, however no definite laceration is visualized. Necrotizing soft tissue infection cannot be excluded by imaging findings alone. No evidence of facial thickening or nonenhancement. No focal abscess.  No acute osseous abnormality. Electronically Signed   By: Lujean RaveMelanie  Ehinger M.D.  On: 12/10/2016 04:18   Dg Knee Complete 4 Views Left  Result Date: 12/10/2016 CLINICAL DATA:  Status post fall onto left lower leg. Initial encounter. EXAM: LEFT KNEE - COMPLETE 4+ VIEW COMPARISON:  Left knee radiographs performed 07/26/2015 FINDINGS: There is no evidence of fracture or dislocation. The joint spaces are preserved. No significant degenerative change is seen; the patellofemoral joint is grossly unremarkable in appearance. No significant joint effusion is seen. Diffuse soft tissue air is seen tracking about the stump and along the popliteal fossa. IMPRESSION: 1. No evidence of fracture or dislocation. 2. Diffuse soft tissue air tracking about the stump and along the popliteal fossa. Would correlate clinically as to whether this reflects a large soft tissue laceration or underlying infection with a gas producing organism. These results were called by telephone at the time of interpretation on 12/10/2016 at 12:25 am to Dr. Dolores Frame, who verbally acknowledged these results. Electronically Signed   By: Roanna Raider M.D.   On: 12/10/2016 00:36    Assessment and Plan: This is a 46 y.o. male who presents with left BKA stump infection.  I have independently viewed the patient's imaging studies and reviewed the patient's laboratory studies.  His left leg CT scan shows extensive soft tissue gas in the stump below the knee level, with some tracks of gas going up to the mid-thigh level.  There are no fluid collections noted.  His WBC is normal and lactic acid is normal.  There is high concern given his worsening pain, cellulitis, tenderness, and CT scan findings for a soft tissue infection, possibly necrotizing.  Given that the patient has had all his surgeries done at the [facility] and also at [facility] with their vascular surgery teams, would recommend transfer to a tertiary care center with vascular surgery for further surgical  management.  The patient likely needs incision and drainage vs debridement of the stump.  In case his management requires further stump revision or conversion to AKA, he would be better served by vascular surgery team.  This was discussed with the patient and Dr. Dolores Frame in the ED.  All questions were answered.   Howie Ill, MD [facility]

## 2016-12-10 NOTE — ED Provider Notes (Signed)
[facility] Emergency Department Provider Note   ____________________________________________   First MD Initiated Contact with Patient 12/10/16 [phone]     (approximate)  I have reviewed the triage vital signs and the nursing notes.   HISTORY  Chief Complaint Fall    HPI [name] is a 46 y.o. male who was a visitor with his wife who was a patient in the emergency department when he fell out of his wheelchair in the parking lot and struck his left BKA stump. Complains of left knee pain. Also reports striking forehead but denies LOC, headache, dizziness, nausea or vomiting. [facility] same status post fall onto his left stump. Reports increased swelling and pain since. Denies associated fever, chills, chest pain, shortness of breath, abdominal pain, nausea, vomiting, diarrhea. Nothing makes his symptoms better or worse.   Past Medical History:  Diagnosis Date  . Asthma   . Collagen vascular disease (HCC)   . COPD (chronic obstructive pulmonary disease) (HCC)   . Diabetes mellitus without complication (HCC)   . Gastric ulcer   . Hypertension   . Myocardial infarction   . Renal disorder   . Thrombocytopenia [facility](HCC)     Patient Active Problem List   Diagnosis Date Noted  . Necrotizing fasciitis (HCC)   . Diabetic foot infection (HCC) 05/08/2015  . DM (diabetes mellitus) (HCC) 05/08/2015  . HTN (hypertension) 05/08/2015  . COPD (chronic obstructive pulmonary disease) (HCC) 05/08/2015    Past Surgical History:  Procedure Laterality Date  . CHOLECYSTECTOMY    . FOOT SURGERY Left   . left below knee amputation      Prior to Admission medications   Medication Sig Start Date End Date Taking? Authorizing Provider  acetaminophen (TYLENOL) 500 MG tablet Take 1,000 mg by mouth every 6 (six) hours as needed for mild pain.  05/09/12   Historical Provider, MD  albuterol (PROAIR HFA) 108 (90 BASE) MCG/ACT inhaler Inhale 2  puffs into the lungs every 4 (four) hours as needed for wheezing or shortness of breath.  08/20/14   Historical Provider, MD  carbamazepine (TEGRETOL) 200 MG tablet Take 1 tablet by mouth 2 (two) times daily.     Historical Provider, MD  carvedilol (COREG) 12.5 MG tablet Take 1 tablet by mouth 2 (two) times daily.    Historical Provider, MD  cholecalciferol (VITAMIN D) 1000 units tablet Take 1,000 Units by mouth daily.    Historical Provider, MD  ferrous sulfate 325 (65 FE) MG EC tablet Take 325 mg by mouth daily with breakfast.    Historical Provider, MD  gabapentin (NEURONTIN) 300 MG capsule Take 2 capsules by mouth 3 (three) times daily. 03/09/15 03/08/16  Historical Provider, MD  insulin aspart (NOVOLOG) 100 UNIT/ML injection Inject 50 Units into the skin See admin instructions. Per sliding scale    Historical Provider, MD  insulin glargine (LANTUS) 100 UNIT/ML injection Inject 10 Units into the skin at bedtime.    Historical Provider, MD  isosorbide mononitrate (IMDUR) 30 MG 24 hr tablet Take 1 tablet by mouth 2 (two) times daily.    Historical Provider, MD  morphine (MS CONTIN) 30 MG 12 hr tablet Take 30 mg by mouth every 12 (twelve) hours.    Historical Provider, MD  Multiple Vitamins-Minerals (MULTIVITAMIN WITH MINERALS) tablet Take 1 tablet by mouth daily.    Historical Provider, MD  oxycodone (OXY-IR) 5 MG capsule Take 1 capsule (5 mg total) by mouth every 4 (  four) hours as needed. Patient taking differently: Take 15 mg by mouth 3 (three) times daily.  09/26/15   Tommi Rumps, PA-C  pantoprazole (PROTONIX) 40 MG tablet Take 40 mg by mouth 2 (two) times daily. 10/30/14   Historical Provider, MD  pravastatin (PRAVACHOL) 20 MG tablet Take 1 tablet by mouth at bedtime.    Historical Provider, MD  QUEtiapine (SEROQUEL) 400 MG tablet Take 1 tablet by mouth at bedtime.    Historical Provider, MD    Allergies Demerol [meperidine]; Arithmin [antazoline]; and Erythromycin  Family History    Problem Relation Age of Onset  . Adopted: Yes    Social History Social History  Substance Use Topics  . Smoking status: Current Every Day Smoker    Packs/day: 1.00    Types: Cigarettes  . Smokeless tobacco: Never Used  . Alcohol use No    Review of Systems  Constitutional: No fever/chills. Eyes: No visual changes. ENT: No sore throat. Cardiovascular: Denies chest pain. Respiratory: Denies shortness of breath. Gastrointestinal: No abdominal pain.  No nausea, no vomiting.  No diarrhea.  No constipation. Genitourinary: Negative for dysuria. Musculoskeletal: Positive for left stump pain. Negative for back pain. Skin: Negative for rash. Neurological: Negative for headaches, focal weakness or numbness.  10-point ROS otherwise negative.  ____________________________________________   PHYSICAL EXAM:  VITAL SIGNS: ED Triage Vitals  Enc Vitals Group     BP 12/09/16 2330 130/88     Pulse Rate 12/09/16 2330 99     Resp 12/09/16 2330 18     Temp 12/09/16 2330 98 F (36.7 C)     Temp Source 12/09/16 2330 Oral     SpO2 12/09/16 2330 99 %     Weight 12/09/16 2332 185 lb (83.9 kg)     Height 12/09/16 2332 6\' 1"  (1.854 m)     Head Circumference --      Peak Flow --      Pain Score 12/09/16 2330 9     Pain Loc --      Pain Edu? --      Excl. in GC? --     Constitutional: Alert and oriented. Chronically ill appearing and in no acute distress. Eyes: Conjunctivae are normal. PERRL. EOMI. Head: Atraumatic. Nose: No congestion/rhinnorhea. Mouth/Throat: Mucous membranes are moist.  Oropharynx non-erythematous. Neck: No stridor.  No cervical spine tenderness to palpation. Cardiovascular: Normal rate, regular rhythm. Grossly normal heart sounds.  Good peripheral circulation. Respiratory: Normal respiratory effort.  No retractions. Lungs CTAB. Gastrointestinal: Soft and nontender. No distention. No abdominal bruits. No CVA tenderness. Musculoskeletal: Left BKA stump: No break in  skin. Mild to moderate swelling with crepitus. No warmth. Mild erythema. Small joint effusion. 2+ femoral pulses. Neurologic:  Normal speech and language. No gross focal neurologic deficits are appreciated.  Skin:  Skin is warm, dry and intact. No rash noted. Psychiatric: Mood and affect are normal. Speech and behavior are normal.  ____________________________________________   LABS (all labs ordered are listed, but only abnormal results are displayed)  Labs Reviewed  CBC WITH DIFFERENTIAL/PLATELET - Abnormal; Notable for the following:       Result Value   RBC 3.30 (*)    Hemoglobin 11.9 (*)    HCT 33.9 (*)    MCV 102.9 (*)    MCH 36.1 (*)    All other components within normal limits  CULTURE, BLOOD (ROUTINE X 2)  CULTURE, BLOOD (ROUTINE X 2)  BASIC METABOLIC PANEL  LACTIC ACID, PLASMA  LACTIC ACID,  PLASMA   ____________________________________________  EKG  None ____________________________________________  RADIOLOGY  Knee x-rays discussed with Dr. Cherly Hensen: 1. No evidence of fracture or dislocation.  2. Diffuse soft tissue air tracking about the stump and along the  popliteal fossa. Would correlate clinically as to whether this  reflects a large soft tissue laceration or underlying infection with  a gas producing organism.    CT left femur discussed with Dr. Manus Gunning: Large volume of tracking soft tissue air about the facial planes of  the BKA stump into the thigh. This can be secondary to laceration in  the setting of fall, however no definite laceration is visualized.  Necrotizing soft tissue infection cannot be excluded by imaging  findings alone. No evidence of facial thickening or nonenhancement.  No focal abscess.    No acute osseous abnormality.   ____________________________________________   PROCEDURES  Procedure(s) performed: None  Procedures  Critical Care performed: Yes, see critical care note(s)   CRITICAL CARE Performed by: Irean Hong   Total critical care time: 60 minutes  Critical care time was exclusive of separately billable procedures and treating other patients.  Critical care was necessary to treat or prevent imminent or life-threatening deterioration.  Critical care was time spent personally by me on the following activities: development of treatment plan with patient and/or surrogate as well as nursing, discussions with consultants, evaluation of patient's response to treatment, examination of patient, obtaining history from patient or surrogate, ordering and performing treatments and interventions, ordering and review of laboratory studies, ordering and review of radiographic studies, pulse oximetry and re-evaluation of patient's condition.  ____________________________________________   INITIAL IMPRESSION / ASSESSMENT AND PLAN / ED COURSE  Pertinent labs & imaging results that were available during my care of the patient were reviewed by me and considered in my medical decision making (see chart for details).  46 year old male who presents with left stump pain after falling out of wheelchair. Patient was seen for same at [facility] 2 days ago with x-rays. I am unable to see the x-rays, but the radiology interpretation does not mention subcutaneous air. Patient tells me he has had necrotizing fasciitis previously. X-rays are concerning for gas producing infection. Will obtain blood cultures, screening lab work including lactate, administer broad-spectrum antibiotics and discuss with general surgery.  Clinical Course as of Dec 10 609  Thu Dec 10, 2016  0328 Discussed case with surgery on call Dr. Aleen Campi. Clinical picture not wholly consistent with necrotizing fasciitis as patient is afebrile with normal WBC and lactate, with minimal clinical findings. Surgery recommends obtaining CT scan with contrast to further delineate.  [JS]  0430 CT discussed with radiologist. Have asked Dr. Aleen Campi to evaluate patient in the  emergency department.  [JS]  [id number] Patient seen by surgery who recommends transfer to [facility] or [place] (patient has had surgery done at both facilities). [facility].  [JS]  [phone] Waiting to hear back from [place] surgery. Review of chart reveals patient was transferred to [facility] in 2016 for debridement of necrotizing fasciitis of the left leg. Discussed with patient who now desires transfer to [facility].  [JS]  0543 Spoke with [facility] transfer center. Awaiting callback from general surgery and emergency department.  [JS]  [phone] Spoke with Dr. Gwendolyn Lima [facility] Surgery on-call) who will evaluate patient in the ED. Accepted by Dr. Laurell Josephs from the [facility] ED.  [JS]  [id number] [facility] called back; updated him of patient's transfer to [facility].  [JS]  Clinical Course User Index [JS] Irean Hong, MD     ____________________________________________   FINAL CLINICAL IMPRESSION(S) / ED DIAGNOSES  Final diagnoses:  Necrotizing fasciitis (HCC)  Cellulitis of left lower extremity  Pain of left lower extremity      NEW MEDICATIONS STARTED DURING THIS VISIT:  New Prescriptions   No medications on file     Note:  This document was prepared using Dragon voice recognition software and may include unintentional dictation errors.    Irean Hong, MD 12/10/16 [phone]

## 2016-12-11 LAB — BLOOD CULTURE ID PANEL (REFLEXED)
ACINETOBACTER BAUMANNII: NOT DETECTED
Candida albicans: NOT DETECTED
Candida glabrata: NOT DETECTED
Candida krusei: NOT DETECTED
Candida parapsilosis: NOT DETECTED
Candida tropicalis: NOT DETECTED
ENTEROCOCCUS SPECIES: NOT DETECTED
Enterobacter cloacae complex: NOT DETECTED
Enterobacteriaceae species: NOT DETECTED
Escherichia coli: NOT DETECTED
HAEMOPHILUS INFLUENZAE: NOT DETECTED
Klebsiella oxytoca: NOT DETECTED
Klebsiella pneumoniae: NOT DETECTED
LISTERIA MONOCYTOGENES: NOT DETECTED
METHICILLIN RESISTANCE: NOT DETECTED
NEISSERIA MENINGITIDIS: NOT DETECTED
PROTEUS SPECIES: NOT DETECTED
PSEUDOMONAS AERUGINOSA: NOT DETECTED
SERRATIA MARCESCENS: NOT DETECTED
STAPHYLOCOCCUS AUREUS BCID: NOT DETECTED
STAPHYLOCOCCUS SPECIES: DETECTED — AB
STREPTOCOCCUS AGALACTIAE: NOT DETECTED
STREPTOCOCCUS PNEUMONIAE: NOT DETECTED
Streptococcus pyogenes: NOT DETECTED
Streptococcus species: NOT DETECTED

## 2016-12-13 LAB — CULTURE, BLOOD (ROUTINE X 2): Special Requests: ADEQUATE

## 2016-12-15 LAB — CULTURE, BLOOD (ROUTINE X 2)
Culture: NO GROWTH
SPECIAL REQUESTS: ADEQUATE

## 2016-12-23 DIAGNOSIS — Z7901 Long term (current) use of anticoagulants: Secondary | ICD-10-CM | POA: Diagnosis not present

## 2016-12-23 DIAGNOSIS — I272 Pulmonary hypertension, unspecified: Secondary | ICD-10-CM | POA: Diagnosis not present

## 2016-12-23 DIAGNOSIS — E119 Type 2 diabetes mellitus without complications: Secondary | ICD-10-CM | POA: Diagnosis not present

## 2016-12-23 DIAGNOSIS — D696 Thrombocytopenia, unspecified: Secondary | ICD-10-CM | POA: Diagnosis not present

## 2016-12-23 DIAGNOSIS — E785 Hyperlipidemia, unspecified: Secondary | ICD-10-CM | POA: Diagnosis not present

## 2016-12-23 DIAGNOSIS — Z89612 Acquired absence of left leg above knee: Secondary | ICD-10-CM | POA: Diagnosis not present

## 2016-12-23 DIAGNOSIS — M545 Low back pain: Secondary | ICD-10-CM | POA: Diagnosis not present

## 2016-12-23 DIAGNOSIS — Z4781 Encounter for orthopedic aftercare following surgical amputation: Secondary | ICD-10-CM | POA: Diagnosis not present

## 2016-12-23 DIAGNOSIS — R69 Illness, unspecified: Secondary | ICD-10-CM | POA: Diagnosis not present

## 2016-12-23 DIAGNOSIS — I10 Essential (primary) hypertension: Secondary | ICD-10-CM | POA: Diagnosis not present

## 2016-12-25 DIAGNOSIS — E119 Type 2 diabetes mellitus without complications: Secondary | ICD-10-CM | POA: Diagnosis not present

## 2016-12-25 DIAGNOSIS — Z4781 Encounter for orthopedic aftercare following surgical amputation: Secondary | ICD-10-CM | POA: Diagnosis not present

## 2016-12-25 DIAGNOSIS — Z7901 Long term (current) use of anticoagulants: Secondary | ICD-10-CM | POA: Diagnosis not present

## 2016-12-25 DIAGNOSIS — R69 Illness, unspecified: Secondary | ICD-10-CM | POA: Diagnosis not present

## 2016-12-25 DIAGNOSIS — M545 Low back pain: Secondary | ICD-10-CM | POA: Diagnosis not present

## 2016-12-25 DIAGNOSIS — I272 Pulmonary hypertension, unspecified: Secondary | ICD-10-CM | POA: Diagnosis not present

## 2016-12-25 DIAGNOSIS — Z89612 Acquired absence of left leg above knee: Secondary | ICD-10-CM | POA: Diagnosis not present

## 2016-12-25 DIAGNOSIS — E785 Hyperlipidemia, unspecified: Secondary | ICD-10-CM | POA: Diagnosis not present

## 2016-12-25 DIAGNOSIS — I10 Essential (primary) hypertension: Secondary | ICD-10-CM | POA: Diagnosis not present

## 2016-12-25 DIAGNOSIS — D696 Thrombocytopenia, unspecified: Secondary | ICD-10-CM | POA: Diagnosis not present

## 2016-12-28 DIAGNOSIS — M545 Low back pain: Secondary | ICD-10-CM | POA: Diagnosis not present

## 2016-12-28 DIAGNOSIS — E119 Type 2 diabetes mellitus without complications: Secondary | ICD-10-CM | POA: Diagnosis not present

## 2016-12-28 DIAGNOSIS — Z89612 Acquired absence of left leg above knee: Secondary | ICD-10-CM | POA: Diagnosis not present

## 2016-12-28 DIAGNOSIS — D696 Thrombocytopenia, unspecified: Secondary | ICD-10-CM | POA: Diagnosis not present

## 2016-12-28 DIAGNOSIS — I10 Essential (primary) hypertension: Secondary | ICD-10-CM | POA: Diagnosis not present

## 2016-12-28 DIAGNOSIS — I272 Pulmonary hypertension, unspecified: Secondary | ICD-10-CM | POA: Diagnosis not present

## 2016-12-28 DIAGNOSIS — R69 Illness, unspecified: Secondary | ICD-10-CM | POA: Diagnosis not present

## 2016-12-28 DIAGNOSIS — Z4781 Encounter for orthopedic aftercare following surgical amputation: Secondary | ICD-10-CM | POA: Diagnosis not present

## 2016-12-28 DIAGNOSIS — E785 Hyperlipidemia, unspecified: Secondary | ICD-10-CM | POA: Diagnosis not present

## 2016-12-28 DIAGNOSIS — Z7901 Long term (current) use of anticoagulants: Secondary | ICD-10-CM | POA: Diagnosis not present

## 2016-12-31 DIAGNOSIS — D696 Thrombocytopenia, unspecified: Secondary | ICD-10-CM | POA: Diagnosis not present

## 2016-12-31 DIAGNOSIS — I272 Pulmonary hypertension, unspecified: Secondary | ICD-10-CM | POA: Diagnosis not present

## 2016-12-31 DIAGNOSIS — E785 Hyperlipidemia, unspecified: Secondary | ICD-10-CM | POA: Diagnosis not present

## 2016-12-31 DIAGNOSIS — Z7901 Long term (current) use of anticoagulants: Secondary | ICD-10-CM | POA: Diagnosis not present

## 2016-12-31 DIAGNOSIS — R69 Illness, unspecified: Secondary | ICD-10-CM | POA: Diagnosis not present

## 2016-12-31 DIAGNOSIS — E119 Type 2 diabetes mellitus without complications: Secondary | ICD-10-CM | POA: Diagnosis not present

## 2016-12-31 DIAGNOSIS — M545 Low back pain: Secondary | ICD-10-CM | POA: Diagnosis not present

## 2016-12-31 DIAGNOSIS — I10 Essential (primary) hypertension: Secondary | ICD-10-CM | POA: Diagnosis not present

## 2016-12-31 DIAGNOSIS — Z4781 Encounter for orthopedic aftercare following surgical amputation: Secondary | ICD-10-CM | POA: Diagnosis not present

## 2016-12-31 DIAGNOSIS — Z89612 Acquired absence of left leg above knee: Secondary | ICD-10-CM | POA: Diagnosis not present

## 2017-01-01 DIAGNOSIS — R69 Illness, unspecified: Secondary | ICD-10-CM | POA: Diagnosis not present

## 2017-01-01 DIAGNOSIS — E785 Hyperlipidemia, unspecified: Secondary | ICD-10-CM | POA: Diagnosis not present

## 2017-01-01 DIAGNOSIS — Z89612 Acquired absence of left leg above knee: Secondary | ICD-10-CM | POA: Diagnosis not present

## 2017-01-01 DIAGNOSIS — D696 Thrombocytopenia, unspecified: Secondary | ICD-10-CM | POA: Diagnosis not present

## 2017-01-01 DIAGNOSIS — E119 Type 2 diabetes mellitus without complications: Secondary | ICD-10-CM | POA: Diagnosis not present

## 2017-01-01 DIAGNOSIS — Z4781 Encounter for orthopedic aftercare following surgical amputation: Secondary | ICD-10-CM | POA: Diagnosis not present

## 2017-01-01 DIAGNOSIS — Z7901 Long term (current) use of anticoagulants: Secondary | ICD-10-CM | POA: Diagnosis not present

## 2017-01-01 DIAGNOSIS — I10 Essential (primary) hypertension: Secondary | ICD-10-CM | POA: Diagnosis not present

## 2017-01-01 DIAGNOSIS — I272 Pulmonary hypertension, unspecified: Secondary | ICD-10-CM | POA: Diagnosis not present

## 2017-01-01 DIAGNOSIS — M545 Low back pain: Secondary | ICD-10-CM | POA: Diagnosis not present

## 2017-01-04 DIAGNOSIS — E785 Hyperlipidemia, unspecified: Secondary | ICD-10-CM | POA: Diagnosis not present

## 2017-01-04 DIAGNOSIS — M545 Low back pain: Secondary | ICD-10-CM | POA: Diagnosis not present

## 2017-01-04 DIAGNOSIS — R69 Illness, unspecified: Secondary | ICD-10-CM | POA: Diagnosis not present

## 2017-01-04 DIAGNOSIS — I272 Pulmonary hypertension, unspecified: Secondary | ICD-10-CM | POA: Diagnosis not present

## 2017-01-04 DIAGNOSIS — Z89612 Acquired absence of left leg above knee: Secondary | ICD-10-CM | POA: Diagnosis not present

## 2017-01-04 DIAGNOSIS — I10 Essential (primary) hypertension: Secondary | ICD-10-CM | POA: Diagnosis not present

## 2017-01-04 DIAGNOSIS — D696 Thrombocytopenia, unspecified: Secondary | ICD-10-CM | POA: Diagnosis not present

## 2017-01-04 DIAGNOSIS — E119 Type 2 diabetes mellitus without complications: Secondary | ICD-10-CM | POA: Diagnosis not present

## 2017-01-04 DIAGNOSIS — Z7901 Long term (current) use of anticoagulants: Secondary | ICD-10-CM | POA: Diagnosis not present

## 2017-01-04 DIAGNOSIS — Z4781 Encounter for orthopedic aftercare following surgical amputation: Secondary | ICD-10-CM | POA: Diagnosis not present

## 2017-01-06 DIAGNOSIS — Z4781 Encounter for orthopedic aftercare following surgical amputation: Secondary | ICD-10-CM | POA: Diagnosis not present

## 2017-01-06 DIAGNOSIS — I10 Essential (primary) hypertension: Secondary | ICD-10-CM | POA: Diagnosis not present

## 2017-01-06 DIAGNOSIS — R69 Illness, unspecified: Secondary | ICD-10-CM | POA: Diagnosis not present

## 2017-01-06 DIAGNOSIS — Z7901 Long term (current) use of anticoagulants: Secondary | ICD-10-CM | POA: Diagnosis not present

## 2017-01-06 DIAGNOSIS — E785 Hyperlipidemia, unspecified: Secondary | ICD-10-CM | POA: Diagnosis not present

## 2017-01-06 DIAGNOSIS — Z89612 Acquired absence of left leg above knee: Secondary | ICD-10-CM | POA: Diagnosis not present

## 2017-01-06 DIAGNOSIS — D696 Thrombocytopenia, unspecified: Secondary | ICD-10-CM | POA: Diagnosis not present

## 2017-01-06 DIAGNOSIS — E119 Type 2 diabetes mellitus without complications: Secondary | ICD-10-CM | POA: Diagnosis not present

## 2017-01-06 DIAGNOSIS — M545 Low back pain: Secondary | ICD-10-CM | POA: Diagnosis not present

## 2017-01-06 DIAGNOSIS — I272 Pulmonary hypertension, unspecified: Secondary | ICD-10-CM | POA: Diagnosis not present

## 2017-01-07 DIAGNOSIS — E785 Hyperlipidemia, unspecified: Secondary | ICD-10-CM | POA: Diagnosis not present

## 2017-01-07 DIAGNOSIS — M545 Low back pain: Secondary | ICD-10-CM | POA: Diagnosis not present

## 2017-01-07 DIAGNOSIS — I272 Pulmonary hypertension, unspecified: Secondary | ICD-10-CM | POA: Diagnosis not present

## 2017-01-07 DIAGNOSIS — Z7901 Long term (current) use of anticoagulants: Secondary | ICD-10-CM | POA: Diagnosis not present

## 2017-01-07 DIAGNOSIS — Z4781 Encounter for orthopedic aftercare following surgical amputation: Secondary | ICD-10-CM | POA: Diagnosis not present

## 2017-01-07 DIAGNOSIS — Z89612 Acquired absence of left leg above knee: Secondary | ICD-10-CM | POA: Diagnosis not present

## 2017-01-07 DIAGNOSIS — D696 Thrombocytopenia, unspecified: Secondary | ICD-10-CM | POA: Diagnosis not present

## 2017-01-07 DIAGNOSIS — E119 Type 2 diabetes mellitus without complications: Secondary | ICD-10-CM | POA: Diagnosis not present

## 2017-01-07 DIAGNOSIS — R69 Illness, unspecified: Secondary | ICD-10-CM | POA: Diagnosis not present

## 2017-01-07 DIAGNOSIS — I10 Essential (primary) hypertension: Secondary | ICD-10-CM | POA: Diagnosis not present

## 2017-01-08 DIAGNOSIS — Z89612 Acquired absence of left leg above knee: Secondary | ICD-10-CM | POA: Diagnosis not present

## 2017-01-08 DIAGNOSIS — E119 Type 2 diabetes mellitus without complications: Secondary | ICD-10-CM | POA: Diagnosis not present

## 2017-01-08 DIAGNOSIS — Z7901 Long term (current) use of anticoagulants: Secondary | ICD-10-CM | POA: Diagnosis not present

## 2017-01-08 DIAGNOSIS — Z4781 Encounter for orthopedic aftercare following surgical amputation: Secondary | ICD-10-CM | POA: Diagnosis not present

## 2017-01-08 DIAGNOSIS — D696 Thrombocytopenia, unspecified: Secondary | ICD-10-CM | POA: Diagnosis not present

## 2017-01-08 DIAGNOSIS — I272 Pulmonary hypertension, unspecified: Secondary | ICD-10-CM | POA: Diagnosis not present

## 2017-01-08 DIAGNOSIS — R69 Illness, unspecified: Secondary | ICD-10-CM | POA: Diagnosis not present

## 2017-01-08 DIAGNOSIS — M545 Low back pain: Secondary | ICD-10-CM | POA: Diagnosis not present

## 2017-01-08 DIAGNOSIS — I10 Essential (primary) hypertension: Secondary | ICD-10-CM | POA: Diagnosis not present

## 2017-01-08 DIAGNOSIS — E785 Hyperlipidemia, unspecified: Secondary | ICD-10-CM | POA: Diagnosis not present

## 2017-01-11 DIAGNOSIS — E785 Hyperlipidemia, unspecified: Secondary | ICD-10-CM | POA: Diagnosis not present

## 2017-01-11 DIAGNOSIS — Z4781 Encounter for orthopedic aftercare following surgical amputation: Secondary | ICD-10-CM | POA: Diagnosis not present

## 2017-01-11 DIAGNOSIS — I272 Pulmonary hypertension, unspecified: Secondary | ICD-10-CM | POA: Diagnosis not present

## 2017-01-11 DIAGNOSIS — R69 Illness, unspecified: Secondary | ICD-10-CM | POA: Diagnosis not present

## 2017-01-11 DIAGNOSIS — Z89612 Acquired absence of left leg above knee: Secondary | ICD-10-CM | POA: Diagnosis not present

## 2017-01-11 DIAGNOSIS — M545 Low back pain: Secondary | ICD-10-CM | POA: Diagnosis not present

## 2017-01-11 DIAGNOSIS — Z7901 Long term (current) use of anticoagulants: Secondary | ICD-10-CM | POA: Diagnosis not present

## 2017-01-11 DIAGNOSIS — I10 Essential (primary) hypertension: Secondary | ICD-10-CM | POA: Diagnosis not present

## 2017-01-11 DIAGNOSIS — D696 Thrombocytopenia, unspecified: Secondary | ICD-10-CM | POA: Diagnosis not present

## 2017-01-11 DIAGNOSIS — E119 Type 2 diabetes mellitus without complications: Secondary | ICD-10-CM | POA: Diagnosis not present

## 2017-01-14 DIAGNOSIS — Z7901 Long term (current) use of anticoagulants: Secondary | ICD-10-CM | POA: Diagnosis not present

## 2017-01-14 DIAGNOSIS — E785 Hyperlipidemia, unspecified: Secondary | ICD-10-CM | POA: Diagnosis not present

## 2017-01-14 DIAGNOSIS — I10 Essential (primary) hypertension: Secondary | ICD-10-CM | POA: Diagnosis not present

## 2017-01-14 DIAGNOSIS — R69 Illness, unspecified: Secondary | ICD-10-CM | POA: Diagnosis not present

## 2017-01-14 DIAGNOSIS — E119 Type 2 diabetes mellitus without complications: Secondary | ICD-10-CM | POA: Diagnosis not present

## 2017-01-14 DIAGNOSIS — Z4781 Encounter for orthopedic aftercare following surgical amputation: Secondary | ICD-10-CM | POA: Diagnosis not present

## 2017-01-14 DIAGNOSIS — Z89612 Acquired absence of left leg above knee: Secondary | ICD-10-CM | POA: Diagnosis not present

## 2017-01-14 DIAGNOSIS — M545 Low back pain: Secondary | ICD-10-CM | POA: Diagnosis not present

## 2017-01-14 DIAGNOSIS — I272 Pulmonary hypertension, unspecified: Secondary | ICD-10-CM | POA: Diagnosis not present

## 2017-01-14 DIAGNOSIS — D696 Thrombocytopenia, unspecified: Secondary | ICD-10-CM | POA: Diagnosis not present

## 2017-01-18 DIAGNOSIS — R69 Illness, unspecified: Secondary | ICD-10-CM | POA: Diagnosis not present

## 2017-01-18 DIAGNOSIS — D696 Thrombocytopenia, unspecified: Secondary | ICD-10-CM | POA: Diagnosis not present

## 2017-01-18 DIAGNOSIS — I272 Pulmonary hypertension, unspecified: Secondary | ICD-10-CM | POA: Diagnosis not present

## 2017-01-18 DIAGNOSIS — I10 Essential (primary) hypertension: Secondary | ICD-10-CM | POA: Diagnosis not present

## 2017-01-18 DIAGNOSIS — E119 Type 2 diabetes mellitus without complications: Secondary | ICD-10-CM | POA: Diagnosis not present

## 2017-01-18 DIAGNOSIS — M545 Low back pain: Secondary | ICD-10-CM | POA: Diagnosis not present

## 2017-01-18 DIAGNOSIS — Z7901 Long term (current) use of anticoagulants: Secondary | ICD-10-CM | POA: Diagnosis not present

## 2017-01-18 DIAGNOSIS — Z4781 Encounter for orthopedic aftercare following surgical amputation: Secondary | ICD-10-CM | POA: Diagnosis not present

## 2017-01-18 DIAGNOSIS — E785 Hyperlipidemia, unspecified: Secondary | ICD-10-CM | POA: Diagnosis not present

## 2017-01-18 DIAGNOSIS — Z89612 Acquired absence of left leg above knee: Secondary | ICD-10-CM | POA: Diagnosis not present

## 2017-01-20 DIAGNOSIS — M545 Low back pain: Secondary | ICD-10-CM | POA: Diagnosis not present

## 2017-01-20 DIAGNOSIS — D696 Thrombocytopenia, unspecified: Secondary | ICD-10-CM | POA: Diagnosis not present

## 2017-01-20 DIAGNOSIS — Z89612 Acquired absence of left leg above knee: Secondary | ICD-10-CM | POA: Diagnosis not present

## 2017-01-20 DIAGNOSIS — E785 Hyperlipidemia, unspecified: Secondary | ICD-10-CM | POA: Diagnosis not present

## 2017-01-20 DIAGNOSIS — E119 Type 2 diabetes mellitus without complications: Secondary | ICD-10-CM | POA: Diagnosis not present

## 2017-01-20 DIAGNOSIS — R69 Illness, unspecified: Secondary | ICD-10-CM | POA: Diagnosis not present

## 2017-01-20 DIAGNOSIS — I10 Essential (primary) hypertension: Secondary | ICD-10-CM | POA: Diagnosis not present

## 2017-01-20 DIAGNOSIS — Z7901 Long term (current) use of anticoagulants: Secondary | ICD-10-CM | POA: Diagnosis not present

## 2017-01-20 DIAGNOSIS — Z4781 Encounter for orthopedic aftercare following surgical amputation: Secondary | ICD-10-CM | POA: Diagnosis not present

## 2017-01-20 DIAGNOSIS — I272 Pulmonary hypertension, unspecified: Secondary | ICD-10-CM | POA: Diagnosis not present

## 2017-02-14 DIAGNOSIS — Z8673 Personal history of transient ischemic attack (TIA), and cerebral infarction without residual deficits: Secondary | ICD-10-CM | POA: Diagnosis not present

## 2017-02-14 DIAGNOSIS — M25474 Effusion, right foot: Secondary | ICD-10-CM | POA: Diagnosis not present

## 2017-02-14 DIAGNOSIS — J449 Chronic obstructive pulmonary disease, unspecified: Secondary | ICD-10-CM | POA: Diagnosis not present

## 2017-02-14 DIAGNOSIS — L03115 Cellulitis of right lower limb: Secondary | ICD-10-CM | POA: Diagnosis not present

## 2017-02-14 DIAGNOSIS — E119 Type 2 diabetes mellitus without complications: Secondary | ICD-10-CM | POA: Diagnosis not present

## 2017-02-14 DIAGNOSIS — I252 Old myocardial infarction: Secondary | ICD-10-CM | POA: Diagnosis not present

## 2017-02-14 DIAGNOSIS — M7989 Other specified soft tissue disorders: Secondary | ICD-10-CM | POA: Diagnosis not present

## 2017-02-14 DIAGNOSIS — M79671 Pain in right foot: Secondary | ICD-10-CM | POA: Diagnosis not present

## 2017-02-14 DIAGNOSIS — L539 Erythematous condition, unspecified: Secondary | ICD-10-CM | POA: Diagnosis not present

## 2017-02-14 DIAGNOSIS — Z885 Allergy status to narcotic agent status: Secondary | ICD-10-CM | POA: Diagnosis not present

## 2017-02-14 DIAGNOSIS — R69 Illness, unspecified: Secondary | ICD-10-CM | POA: Diagnosis not present

## 2017-02-14 DIAGNOSIS — I10 Essential (primary) hypertension: Secondary | ICD-10-CM | POA: Diagnosis not present

## 2017-06-11 IMAGING — CR DG CHEST 1V PORT
1 series · 1 of 1 positions shown · non-contrast
Comparison: 09/05/2014

CLINICAL DATA: Chest pain starting around 8 p.m.. Central chest
crushing and stabbing pain radiating to the left shoulder. Nausea
and vomiting.

EXAM:
PORTABLE CHEST - 1 VIEW

[ap]
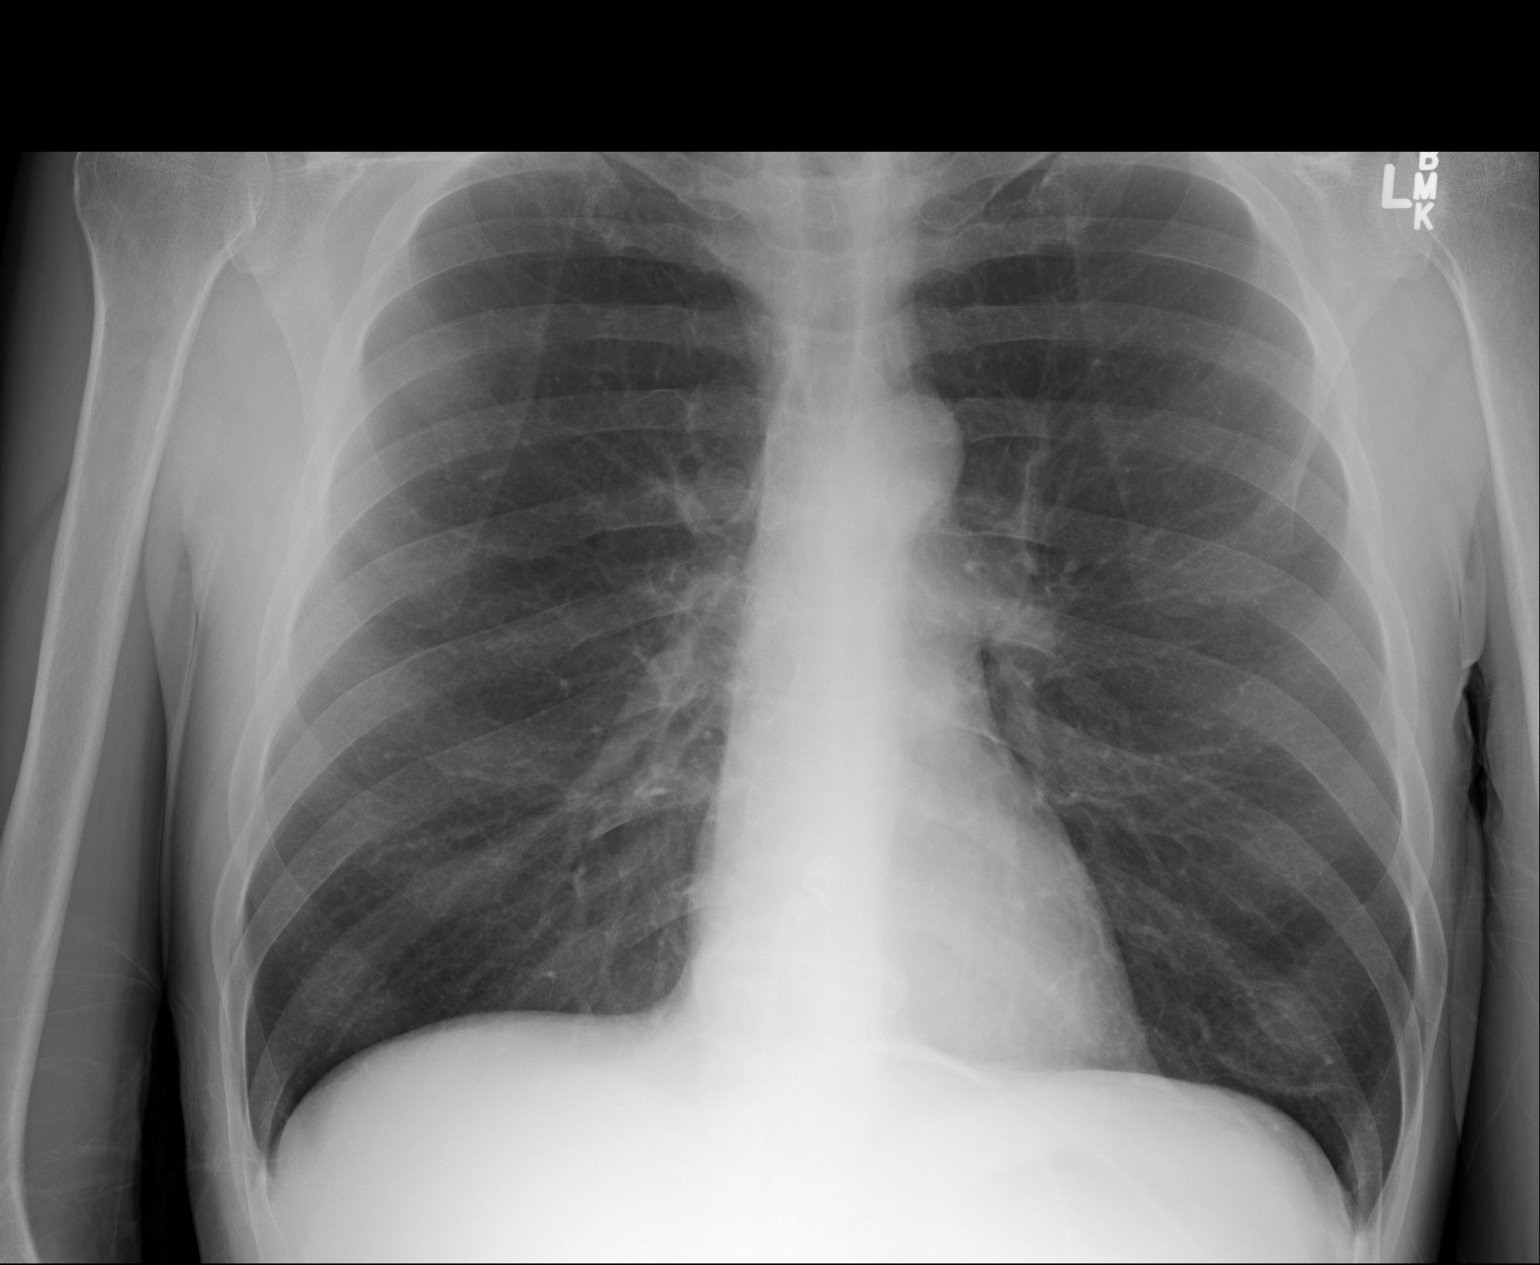

[1 of 1 positions shown; findings below may reference images not displayed]

FINDINGS: Mild hyperinflation. Focal linear fibrosis in the left base. The
heart size and mediastinal contours are within normal limits. Both
lungs are clear. The visualized skeletal structures are
unremarkable.
IMPRESSION: No active disease.

## 2017-06-12 IMAGING — CR DG ABDOMEN 2V
1 series · 2 of 2 positions shown · non-contrast
Comparison: CT abdomen and pelvis 05/19/2012.  Abdomen 09/10/2009

CLINICAL DATA: Chest pain and abdominal pain.  Vomiting.

EXAM:
ABDOMEN - 2 VIEW

[Series 1: dg abd 2 views · 0.14mm/px · 2 of 2 slices shown]
[im 1/2]
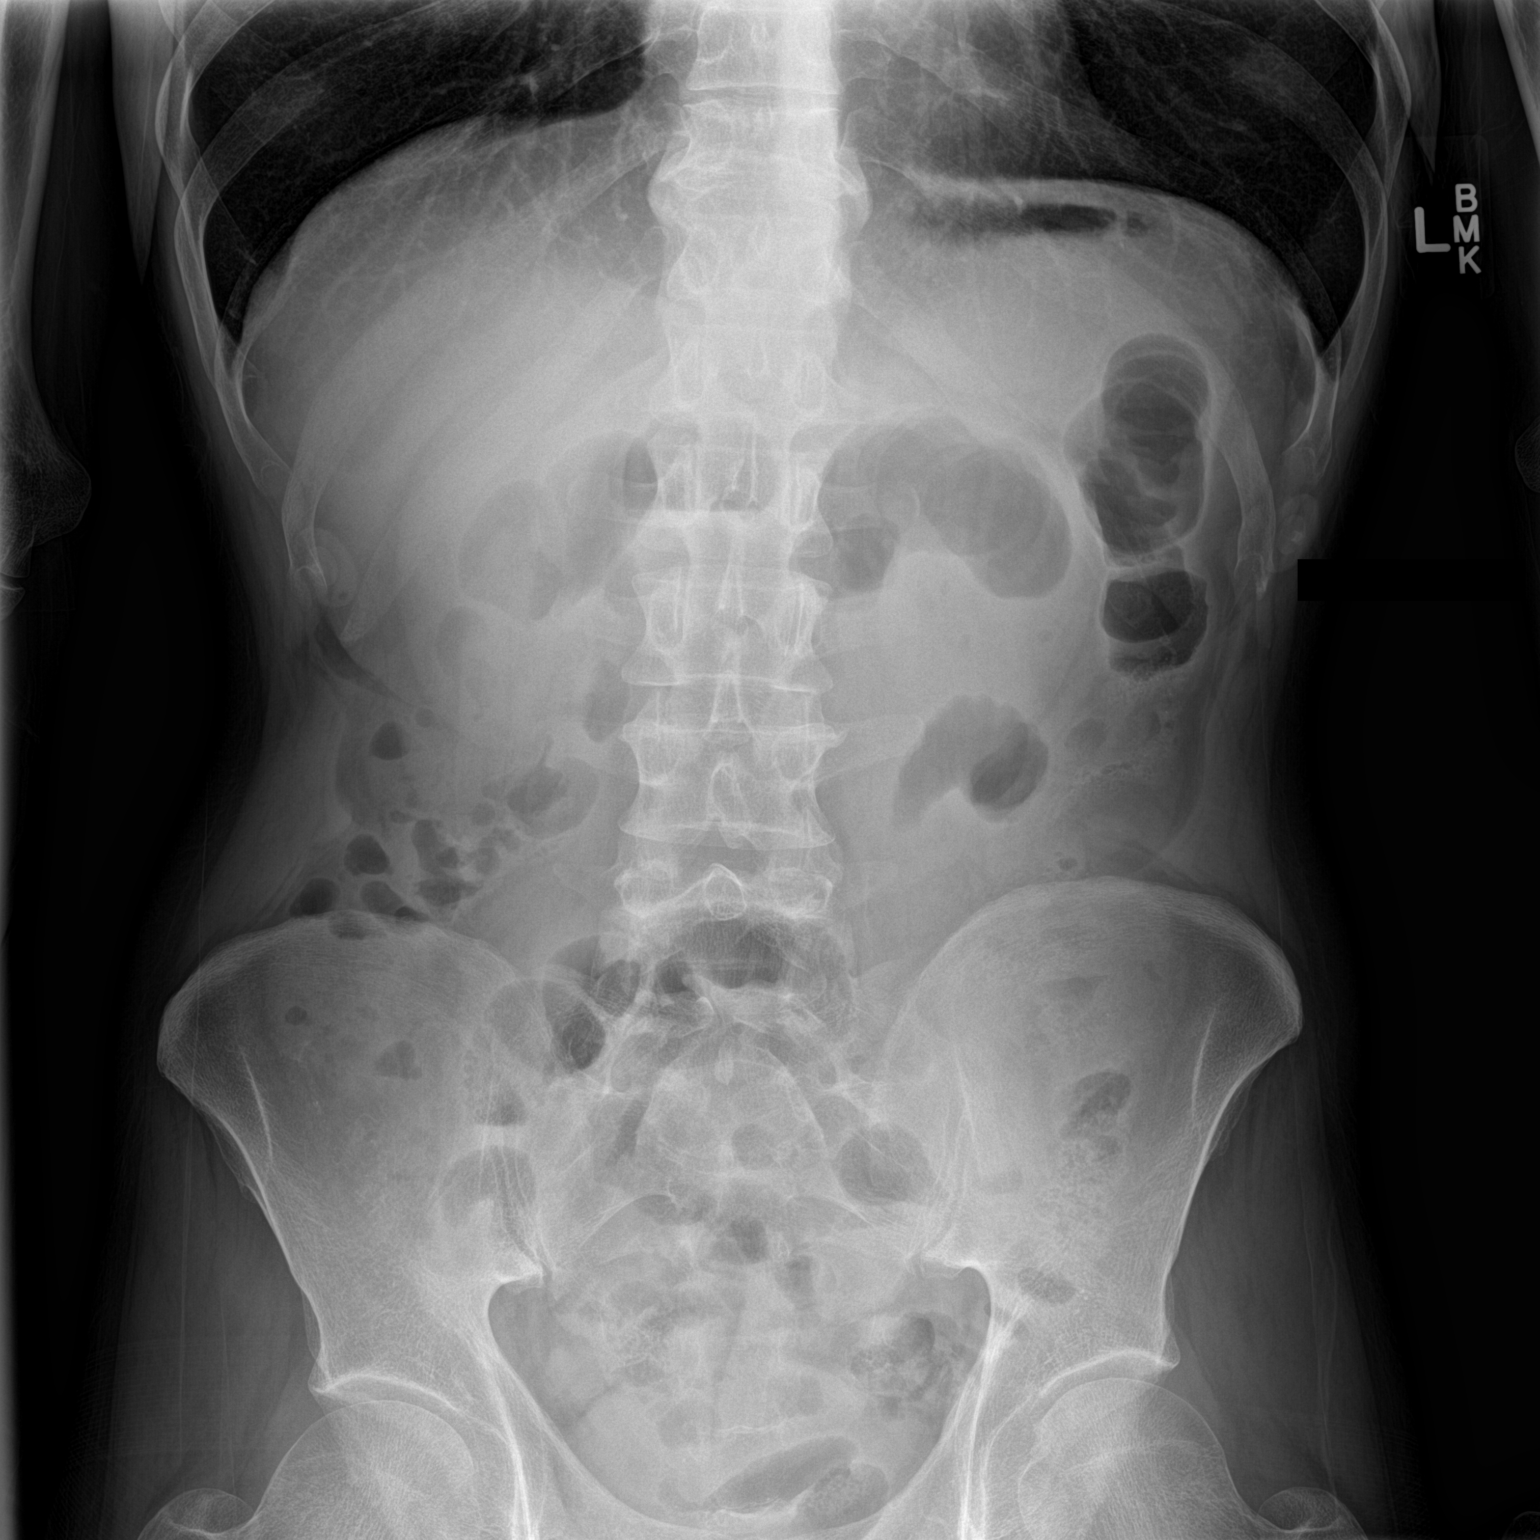
[im 2/2]
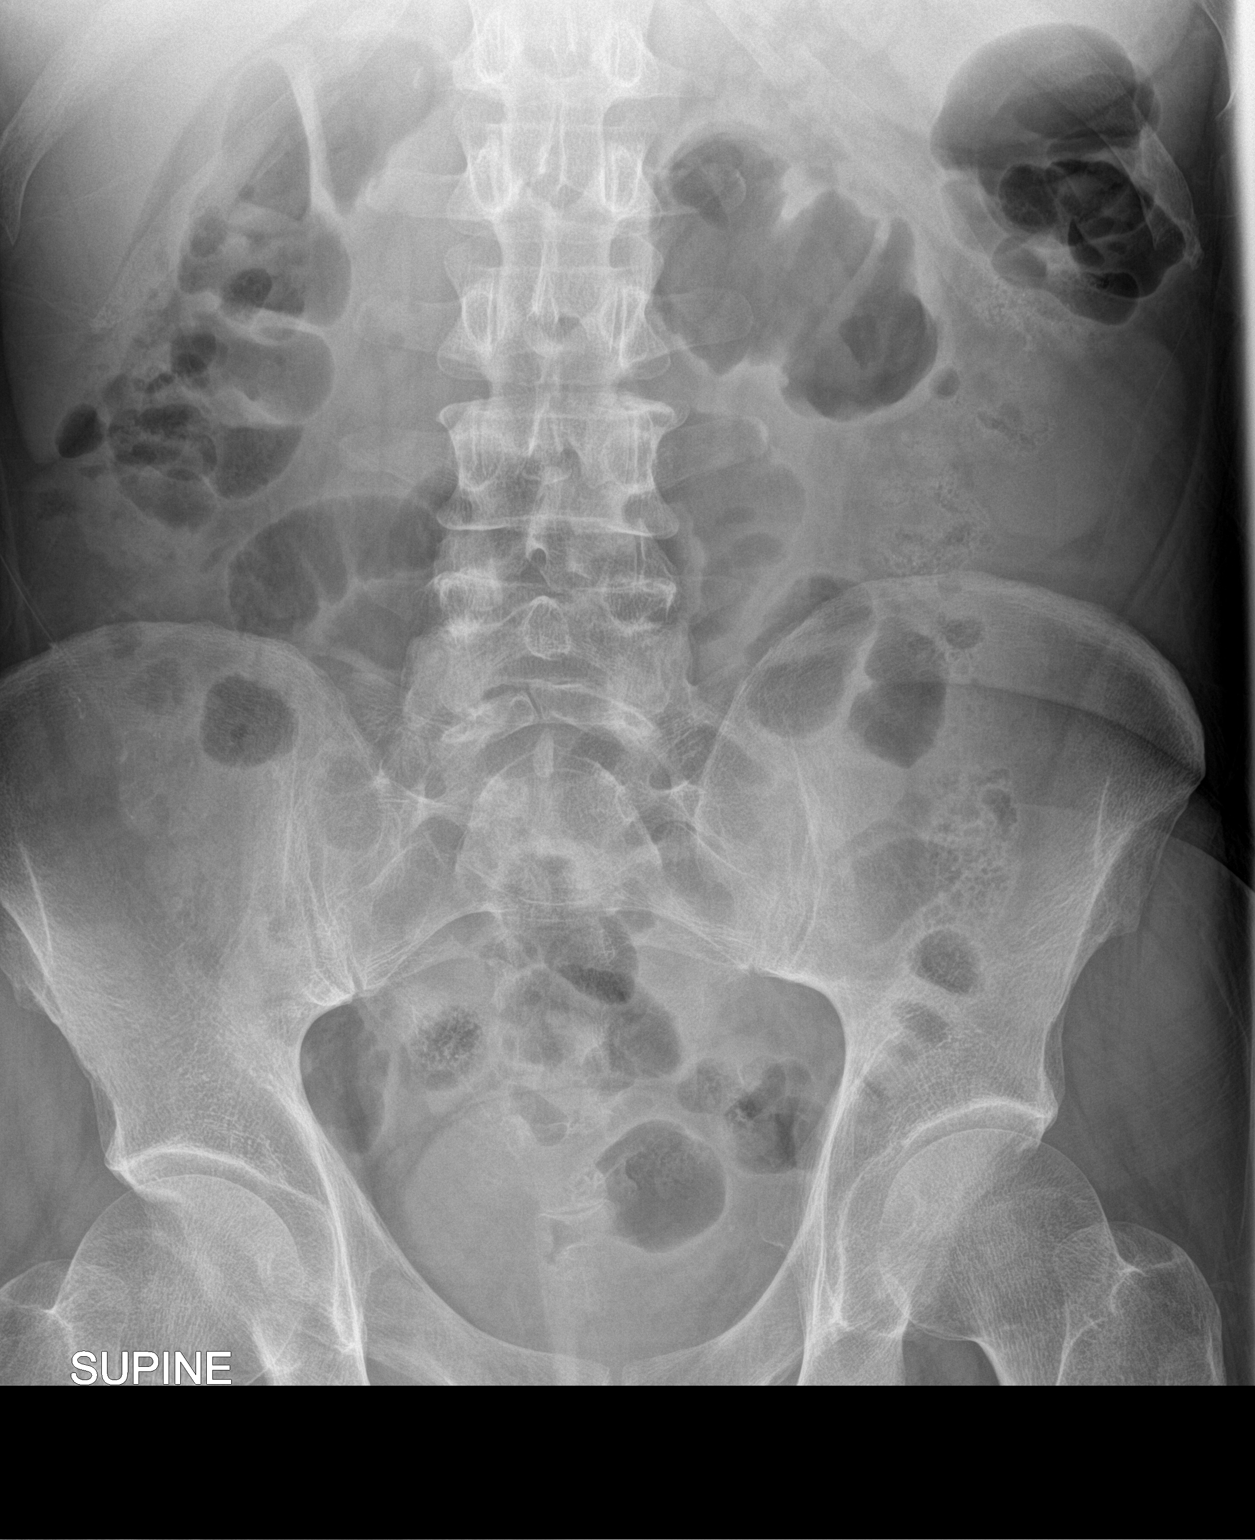

[2 of 2 positions shown; findings below may reference images not displayed]

FINDINGS: The bowel gas pattern is normal. There is no evidence of free air.
No radio-opaque calculi or other significant radiographic
abnormality is seen.
IMPRESSION: Negative.

## 2018-02-23 ENCOUNTER — Encounter: Payer: Self-pay | Admitting: Emergency Medicine

## 2018-02-23 ENCOUNTER — Other Ambulatory Visit: Payer: Self-pay

## 2018-02-23 ENCOUNTER — Emergency Department
Admission: EM | Admit: 2018-02-23 | Discharge: 2018-02-23 | Disposition: A | Discharge status: Home / Self Care | Payer: Medicare HMO | Attending: Emergency Medicine | Admitting: Emergency Medicine

## 2018-02-23 DIAGNOSIS — E119 Type 2 diabetes mellitus without complications: Secondary | ICD-10-CM | POA: Diagnosis not present

## 2018-02-23 DIAGNOSIS — Z7902 Long term (current) use of antithrombotics/antiplatelets: Secondary | ICD-10-CM | POA: Diagnosis not present

## 2018-02-23 DIAGNOSIS — Z794 Long term (current) use of insulin: Secondary | ICD-10-CM | POA: Diagnosis not present

## 2018-02-23 DIAGNOSIS — Z79899 Other long term (current) drug therapy: Secondary | ICD-10-CM | POA: Insufficient documentation

## 2018-02-23 DIAGNOSIS — L03115 Cellulitis of right lower limb: Secondary | ICD-10-CM | POA: Insufficient documentation

## 2018-02-23 DIAGNOSIS — L0291 Cutaneous abscess, unspecified: Secondary | ICD-10-CM | POA: Diagnosis present

## 2018-02-23 DIAGNOSIS — F17210 Nicotine dependence, cigarettes, uncomplicated: Secondary | ICD-10-CM | POA: Insufficient documentation

## 2018-02-23 DIAGNOSIS — I252 Old myocardial infarction: Secondary | ICD-10-CM | POA: Diagnosis not present

## 2018-02-23 DIAGNOSIS — J45909 Unspecified asthma, uncomplicated: Secondary | ICD-10-CM | POA: Diagnosis not present

## 2018-02-23 DIAGNOSIS — J449 Chronic obstructive pulmonary disease, unspecified: Secondary | ICD-10-CM | POA: Insufficient documentation

## 2018-02-23 DIAGNOSIS — R69 Illness, unspecified: Secondary | ICD-10-CM | POA: Diagnosis not present

## 2018-02-23 DIAGNOSIS — I10 Essential (primary) hypertension: Secondary | ICD-10-CM | POA: Diagnosis not present

## 2018-02-23 MED ORDER — COLLAGENASE 250 UNIT/GM EX OINT: 1.0000 "application " | TOPICAL_OINTMENT | Freq: Two times a day (BID) | CUTANEOUS | 0 refills | Status: DC

## 2018-02-23 MED ORDER — BACITRACIN ZINC 500 UNIT/GM EX OINT
1.0000 "application " | TOPICAL_OINTMENT | Freq: Once | CUTANEOUS | Status: AC
  Administered 2018-02-23: 1 via TOPICAL

## 2018-02-23 MED ORDER — SULFAMETHOXAZOLE-TRIMETHOPRIM 800-160 MG PO TABS: 1.0000 | ORAL_TABLET | Freq: Two times a day (BID) | ORAL | 0 refills | Status: DC

## 2018-02-23 NOTE — ED Triage Notes (Signed)
Pt to triage via w/c with no distress noted; pt reports ?abscess to right lower leg x wk; appears to be approx quarter size ulcer with no surrounding redness or drainage

## 2018-02-23 NOTE — ED Notes (Addendum)
Pt presents to ED with wound to his right lower leg. Pt states a week ago he hit his leg on his wheelchair. Wound is not healing. Has been treating himself at home with betadine scrub and neosporin and has applied clean a dressing daily. No improvement per pt. No drainage noted from wound. Slight swelling around dime sized wound with redness noted.

## 2018-02-23 NOTE — ED Provider Notes (Signed)
[facility] Emergency Department Provider Note  ____________________________________________  Time seen: Approximately 7:30 PM  I have reviewed the triage vital signs and the nursing notes.   HISTORY  Chief Complaint Abscess   HPI [name] is a 47 y.o. male who presents to the emergency department for treatment and evaluation of a wound to the right lower leg. He hit it on his wheelchair about a week ago and it is not healing well. He has used a betadine scrub and neosporin without improvement.    Past Medical History:  Diagnosis Date  . Asthma   . Collagen vascular disease (HCC)   . COPD (chronic obstructive pulmonary disease) (HCC)   . Diabetes mellitus without complication (HCC)   . Gastric ulcer   . Hypertension   . Myocardial infarction (HCC)   . Renal disorder   . Thrombocytopenia [facility])     Patient Active Problem List   Diagnosis Date Noted  . Necrotizing fasciitis (HCC)   . Diabetic foot infection (HCC) 05/08/2015  . DM (diabetes mellitus) (HCC) 05/08/2015  . HTN (hypertension) 05/08/2015  . COPD (chronic obstructive pulmonary disease) (HCC) 05/08/2015    Past Surgical History:  Procedure Laterality Date  . CHOLECYSTECTOMY    . FOOT SURGERY Left   . left below knee amputation      Prior to Admission medications   Medication Sig Start Date End Date Taking? Authorizing Provider  acetaminophen (TYLENOL) 500 MG tablet Take 1,000 mg by mouth every 6 (six) hours as needed for mild pain.  05/09/12   [provider]  albuterol (PROAIR HFA) 108 (90 BASE) MCG/ACT inhaler Inhale 2 puffs into the lungs every 4 (four) hours as needed for wheezing or shortness of breath.  08/20/14   [provider]  carbamazepine (TEGRETOL) 200 MG tablet Take 1 tablet by mouth 2 (two) times daily.     [provider]  carvedilol (COREG) 12.5 MG tablet Take 1 tablet by mouth 2 (two) times daily.    [provider]   cholecalciferol (VITAMIN D) 1000 units tablet Take 1,000 Units by mouth daily.    [provider]  collagenase (SANTYL) ointment Apply 1 application topically 2 (two) times daily. 02/23/18   [place] Anagnos, Rulon Eisenmenger B, FNP  ferrous sulfate 325 (65 FE) MG EC tablet Take 325 mg by mouth daily with breakfast.    [provider]  gabapentin (NEURONTIN) 300 MG capsule Take 2 capsules by mouth 3 (three) times daily. 03/09/15 03/08/16  [provider]  insulin aspart (NOVOLOG) 100 UNIT/ML injection Inject 50 Units into the skin See admin instructions. Per sliding scale    [provider]  insulin glargine (LANTUS) 100 UNIT/ML injection Inject 10 Units into the skin at bedtime.    [provider]  isosorbide mononitrate (IMDUR) 30 MG 24 hr tablet Take 1 tablet by mouth 2 (two) times daily.    [provider]  morphine (MS CONTIN) 30 MG 12 hr tablet Take 30 mg by mouth every 12 (twelve) hours.    [provider]  Multiple Vitamins-Minerals (MULTIVITAMIN WITH MINERALS) tablet Take 1 tablet by mouth daily.    [provider]  oxycodone (OXY-IR) 5 MG capsule Take 1 capsule (5 mg total) by mouth every 4 (four) hours as needed. Patient taking differently: Take 15 mg by mouth 3 (three) times daily.  09/26/15   Tommi Rumps, PA-C  pantoprazole (PROTONIX) 40 MG tablet Take 40 mg by mouth 2 (two) times daily.  10/30/14   [provider]  pravastatin (PRAVACHOL) 20 MG tablet Take 1 tablet by mouth at bedtime.    [provider]  QUEtiapine (SEROQUEL) 400 MG tablet Take 1 tablet by mouth at bedtime.    [provider]  sulfamethoxazole-trimethoprim (BACTRIM DS,SEPTRA DS) 800-160 MG tablet Take 1 tablet by mouth 2 (two) times daily. 02/23/18   Chinita Pesterriplett, Hartlee Amedee B, FNP    Allergies Demerol [meperidine]; Arithmin [antazoline]; and Erythromycin  Family History  Adopted: Yes    Social History Social History   Tobacco Use  .  Smoking status: Current Every Day Smoker    Packs/day: 1.00    Types: Cigarettes  . Smokeless tobacco: Never Used  Substance Use Topics  . Alcohol use: No  . Drug use: No    Review of Systems  Constitutional: Negative for fever. Respiratory: Negative for cough or shortness of breath.  Musculoskeletal: Negative for myalgias Skin: Positive for lesion on the right lower extremity. Neurological: Negative for numbness or paresthesias. ____________________________________________   PHYSICAL EXAM:  VITAL SIGNS: ED Triage Vitals [02/23/18 1859]  Enc Vitals Group     BP 133/89     Pulse Rate 88     Resp 18     Temp 98.4 F (36.9 C)     Temp Source Oral     SpO2 97 %     Weight 180 lb (81.6 kg)     Height 6\' 1"  (1.854 m)     Head Circumference      Peak Flow      Pain Score 4     Pain Loc      Pain Edu?      Excl. in GC?      Constitutional: Chronically ill appearing. Eyes: Conjunctivae are clear without discharge or drainage. Nose: No rhinorrhea noted. Mouth/Throat: Airway is patent.  Neck: No stridor. Unrestricted range of motion observed. Cardiovascular: Capillary refill is <3 seconds.  Respiratory: Respirations are even and unlabored.. Musculoskeletal: Unrestricted range of motion observed. Neurologic: Awake, alert, and oriented x 4.  Skin:  2cm lesion to the pretibial area on the right lower extremity. Lesion has an open center with erythematous boarder. No fluctuance. Surrounding erythema without induration.  ____________________________________________   LABS (all labs ordered are listed, but only abnormal results are displayed)  Labs Reviewed - No data to display ____________________________________________  EKG  Not indicated. ____________________________________________  RADIOLOGY  Not indicated. ____________________________________________   PROCEDURES  Procedures ____________________________________________   INITIAL IMPRESSION /  ASSESSMENT AND PLAN / ED COURSE  [name] is a 47 y.o. male who presents to the emergency department for treatment and evaluation of a lesion to the right lower extremity that has been present for the past week. He will be treated with Bactrim and Santyl. He is to follow up with the wound care center or see his primary care provider for symptoms that are not improving over the next few days.    Medications  bacitracin ointment 1 application (1 application Topical Given 02/23/18 2044)     Pertinent labs & imaging results that were available during my care of the patient were reviewed by me and considered in my medical decision making (see chart for details).  ____________________________________________   FINAL CLINICAL IMPRESSION(S) / ED DIAGNOSES  Final diagnoses:  Cellulitis of right lower extremity    ED Discharge Orders        Ordered    sulfamethoxazole-trimethoprim (BACTRIM DS,SEPTRA DS) 800-160 MG tablet  2 times daily  02/23/18 2034    collagenase (SANTYL) ointment  2 times daily     02/23/18 2034       Note:  This document was prepared using Dragon voice recognition software and may include unintentional dictation errors.    Chinita Pester, FNP 02/23/18 2103    Dionne Bucy, MD 02/23/18 2315

## 2018-02-23 NOTE — ED Notes (Signed)

## 2018-06-01 ENCOUNTER — Emergency Department: Payer: Medicare HMO

## 2018-06-01 ENCOUNTER — Other Ambulatory Visit: Payer: Self-pay

## 2018-06-01 ENCOUNTER — Encounter: Payer: Self-pay | Admitting: Emergency Medicine

## 2018-06-01 ENCOUNTER — Inpatient Hospital Stay
Admission: EM | Admit: 2018-06-01 | Discharge: 2018-06-03 | DRG: 871 | Disposition: A | Discharge status: Home / Self Care | Payer: Medicare HMO | Attending: Internal Medicine | Admitting: Internal Medicine

## 2018-06-01 DIAGNOSIS — I959 Hypotension, unspecified: Secondary | ICD-10-CM | POA: Diagnosis present

## 2018-06-01 DIAGNOSIS — E119 Type 2 diabetes mellitus without complications: Secondary | ICD-10-CM | POA: Diagnosis not present

## 2018-06-01 DIAGNOSIS — I10 Essential (primary) hypertension: Secondary | ICD-10-CM | POA: Diagnosis present

## 2018-06-01 DIAGNOSIS — S2232XA Fracture of one rib, left side, initial encounter for closed fracture: Secondary | ICD-10-CM

## 2018-06-01 DIAGNOSIS — J440 Chronic obstructive pulmonary disease with acute lower respiratory infection: Secondary | ICD-10-CM | POA: Diagnosis present

## 2018-06-01 DIAGNOSIS — E222 Syndrome of inappropriate secretion of antidiuretic hormone: Secondary | ICD-10-CM | POA: Diagnosis present

## 2018-06-01 DIAGNOSIS — I252 Old myocardial infarction: Secondary | ICD-10-CM

## 2018-06-01 DIAGNOSIS — Z79899 Other long term (current) drug therapy: Secondary | ICD-10-CM

## 2018-06-01 DIAGNOSIS — A419 Sepsis, unspecified organism: Principal | ICD-10-CM | POA: Diagnosis present

## 2018-06-01 DIAGNOSIS — Z716 Tobacco abuse counseling: Secondary | ICD-10-CM | POA: Diagnosis not present

## 2018-06-01 DIAGNOSIS — J449 Chronic obstructive pulmonary disease, unspecified: Secondary | ICD-10-CM | POA: Diagnosis present

## 2018-06-01 DIAGNOSIS — E871 Hypo-osmolality and hyponatremia: Secondary | ICD-10-CM

## 2018-06-01 DIAGNOSIS — F17210 Nicotine dependence, cigarettes, uncomplicated: Secondary | ICD-10-CM | POA: Diagnosis present

## 2018-06-01 DIAGNOSIS — Z89512 Acquired absence of left leg below knee: Secondary | ICD-10-CM

## 2018-06-01 DIAGNOSIS — J189 Pneumonia, unspecified organism: Secondary | ICD-10-CM | POA: Diagnosis present

## 2018-06-01 DIAGNOSIS — R609 Edema, unspecified: Secondary | ICD-10-CM

## 2018-06-01 DIAGNOSIS — W1830XA Fall on same level, unspecified, initial encounter: Secondary | ICD-10-CM

## 2018-06-01 DIAGNOSIS — I2510 Atherosclerotic heart disease of native coronary artery without angina pectoris: Secondary | ICD-10-CM | POA: Diagnosis present

## 2018-06-01 DIAGNOSIS — E861 Hypovolemia: Secondary | ICD-10-CM | POA: Diagnosis present

## 2018-06-01 DIAGNOSIS — I340 Nonrheumatic mitral (valve) insufficiency: Secondary | ICD-10-CM | POA: Diagnosis not present

## 2018-06-01 DIAGNOSIS — Z888 Allergy status to other drugs, medicaments and biological substances status: Secondary | ICD-10-CM | POA: Diagnosis not present

## 2018-06-01 DIAGNOSIS — R600 Localized edema: Secondary | ICD-10-CM | POA: Diagnosis not present

## 2018-06-01 DIAGNOSIS — K219 Gastro-esophageal reflux disease without esophagitis: Secondary | ICD-10-CM | POA: Diagnosis present

## 2018-06-01 DIAGNOSIS — L03115 Cellulitis of right lower limb: Secondary | ICD-10-CM | POA: Diagnosis not present

## 2018-06-01 LAB — COMPREHENSIVE METABOLIC PANEL
ALK PHOS: 257 U/L — AB (ref 38–126)
ALT: 22 U/L (ref 0–44)
AST: 69 U/L — ABNORMAL HIGH (ref 15–41)
Albumin: 2.7 g/dL — ABNORMAL LOW (ref 3.5–5.0)
Anion gap: 11 (ref 5–15)
BILIRUBIN TOTAL: 1.3 mg/dL — AB (ref 0.3–1.2)
BUN: 7 mg/dL (ref 6–20)
CALCIUM: 8.2 mg/dL — AB (ref 8.9–10.3)
CO2: 22 mmol/L (ref 22–32)
Chloride: 77 mmol/L — ABNORMAL LOW (ref 98–111)
Creatinine, Ser: 0.78 mg/dL (ref 0.61–1.24)
GLUCOSE: 117 mg/dL — AB (ref 70–99)
Potassium: 5.5 mmol/L — ABNORMAL HIGH (ref 3.5–5.1)
Sodium: 110 mmol/L — CL (ref 135–145)
TOTAL PROTEIN: 6.9 g/dL (ref 6.5–8.1)

## 2018-06-01 LAB — CBC WITH DIFFERENTIAL/PLATELET
Basophils Absolute: 0 10*3/uL (ref 0–0.1)
Basophils Relative: 0 %
EOS PCT: 1 %
Eosinophils Absolute: 0.1 10*3/uL (ref 0–0.7)
HCT: 35.4 % — ABNORMAL LOW (ref 40.0–52.0)
Hemoglobin: 12.5 g/dL — ABNORMAL LOW (ref 13.0–18.0)
LYMPHS ABS: 0.5 10*3/uL — AB (ref 1.0–3.6)
Lymphocytes Relative: 3 %
MCH: 36.3 pg — ABNORMAL HIGH (ref 26.0–34.0)
MCHC: 35.3 g/dL (ref 32.0–36.0)
MCV: 102.8 fL — ABNORMAL HIGH (ref 80.0–100.0)
MONOS PCT: 4 %
Monocytes Absolute: 0.7 10*3/uL (ref 0.2–1.0)
Neutro Abs: 15.4 10*3/uL — ABNORMAL HIGH (ref 1.4–6.5)
Neutrophils Relative %: 92 %
Platelets: 338 10*3/uL (ref 150–440)
RBC: 3.44 MIL/uL — ABNORMAL LOW (ref 4.40–5.90)
RDW: 13.8 % (ref 11.5–14.5)
WBC: 16.7 10*3/uL — AB (ref 3.8–10.6)

## 2018-06-01 LAB — BRAIN NATRIURETIC PEPTIDE: B Natriuretic Peptide: 312 pg/mL — ABNORMAL HIGH (ref 0.0–100.0)

## 2018-06-01 MED ORDER — SODIUM CHLORIDE 0.9 % IV SOLN
Freq: Once | INTRAVENOUS | Status: AC
  Administered 2018-06-01: via INTRAVENOUS

## 2018-06-01 MED ORDER — SODIUM CHLORIDE 0.9 % IV SOLN
500.0000 mg | Freq: Once | INTRAVENOUS | Status: AC
  Administered 2018-06-02: 500 mg via INTRAVENOUS
  Filled 2018-06-01: qty 500

## 2018-06-01 MED ORDER — CEFTRIAXONE SODIUM 1 G IJ SOLR: 1.0000 g | Freq: Once | INTRAMUSCULAR | Status: DC

## 2018-06-01 MED ORDER — AZITHROMYCIN 500 MG PO TABS
500.0000 mg | ORAL_TABLET | Freq: Once | ORAL | Status: AC
  Administered 2018-06-02: 500 mg via ORAL
  Filled 2018-06-01: qty 1

## 2018-06-01 NOTE — ED Provider Notes (Signed)
[facility] Emergency Department Provider Note       Time seen: ----------------------------------------- 8:54 PM on 06/01/2018 -----------------------------------------   I have reviewed the triage vital signs and the nursing notes.  HISTORY   Chief Complaint Leg Swelling    HPI [name] is a 47 y.o. male with a history of collagen vascular disease, COPD, diabetes, gastric ulcer, hypertension, MI, thrombocytopenia who presents to the ED for right leg swelling for the past several days.  Patient states he has some wounds present on the right calf that are weeping.  He denies any known history of heart failure.  He does report to falling recently and injuring his left ribs, he is concerned he may have a left rib fracture.  Typically when his right leg swells he has had infection.  He feels like it is more swollen than normal.  Past Medical History:  Diagnosis Date  . Asthma   . Collagen vascular disease (HCC)   . COPD (chronic obstructive pulmonary disease) (HCC)   . Diabetes mellitus without complication (HCC)   . Gastric ulcer   . Hypertension   . Myocardial infarction (HCC)   . Renal disorder   . Thrombocytopenia [facility])     Patient Active Problem List   Diagnosis Date Noted  . Necrotizing fasciitis (HCC)   . Diabetic foot infection (HCC) 05/08/2015  . DM (diabetes mellitus) (HCC) 05/08/2015  . HTN (hypertension) 05/08/2015  . COPD (chronic obstructive pulmonary disease) (HCC) 05/08/2015    Past Surgical History:  Procedure Laterality Date  . CHOLECYSTECTOMY    . FOOT SURGERY Left   . left below knee amputation      Allergies Demerol [meperidine]; Arithmin [antazoline]; and Erythromycin  Social History Social History   Tobacco Use  . Smoking status: Current Every Day Smoker    Packs/day: 1.00    Types: Cigarettes  . Smokeless tobacco: Never Used  Substance Use Topics  . Alcohol use: No  . Drug use: No   Review of  Systems Constitutional: Negative for fever. Cardiovascular: Negative for chest pain. Respiratory: Negative for shortness of breath. Gastrointestinal: Negative for abdominal pain, vomiting and diarrhea. Musculoskeletal: Positive for right leg swelling, left-sided rib pain Skin: Negative for rash. Neurological: Negative for headaches, focal weakness or numbness.  All systems negative/normal/unremarkable except as stated in the HPI  ____________________________________________   PHYSICAL EXAM:  VITAL SIGNS: ED Triage Vitals  Enc Vitals Group     BP 06/01/18 2050 122/77     Pulse Rate 06/01/18 2050 96     Resp 06/01/18 2050 17     Temp 06/01/18 2052 98.2 F (36.8 C)     Temp Source 06/01/18 2052 Oral     SpO2 06/01/18 2050 100 %     Weight 06/01/18 2051 165 lb (74.8 kg)     Height 06/01/18 2051 6\' 1"  (1.854 m)     Head Circumference --      Peak Flow --      Pain Score 06/01/18 2051 7     Pain Loc --      Pain Edu? --      Excl. in GC? --    Constitutional: Alert and oriented. Well appearing and in no distress. Eyes: Conjunctivae are normal. Normal extraocular movements. Cardiovascular: Normal rate, regular rhythm. No murmurs, rubs, or gallops. Respiratory: Normal respiratory effort without tachypnea nor retractions. Breath sounds are clear and equal bilaterally. No wheezes/rales/rhonchi. Gastrointestinal: Soft and nontender. Normal bowel sounds Musculoskeletal: Diffuse right  lower extremity edema is noted.  There are some ulcerations noted, no obvious erythema or cellulitis.  Left sided amputation.  Left inferior rib tenderness Neurologic:  Normal speech and language. No gross focal neurologic deficits are appreciated.  Skin: Chronic appearing edema with some skin ulcerations on the right lower extremity Psychiatric: Mood and affect are normal. Speech and behavior are normal.  ___________________________________________  ED COURSE:  As part of my medical decision making,  I reviewed the following data within the electronic MEDICAL RECORD NUMBER History obtained from family if available, nursing notes, old chart and ekg, as well as notes from prior ED visits. Patient presented for right lower extremity edema, we will assess with labs and imaging as indicated at this time.   Procedures ____________________________________________   LABS (pertinent positives/negatives)  Labs Reviewed  CBC WITH DIFFERENTIAL/PLATELET - Abnormal; Notable for the following components:      Result Value   WBC 16.7 (*)    RBC 3.44 (*)    Hemoglobin 12.5 (*)    HCT 35.4 (*)    MCV 102.8 (*)    MCH 36.3 (*)    Neutro Abs 15.4 (*)    Lymphs Abs 0.5 (*)    All other components within normal limits  COMPREHENSIVE METABOLIC PANEL - Abnormal; Notable for the following components:   Sodium 110 (*)    Potassium 5.5 (*)    Chloride 77 (*)    Glucose, Bld 117 (*)    Calcium 8.2 (*)    Albumin 2.7 (*)    AST 69 (*)    Alkaline Phosphatase 257 (*)    Total Bilirubin 1.3 (*)    All other components within normal limits  CULTURE, BLOOD (ROUTINE X 2)  CULTURE, BLOOD (ROUTINE X 2)  CARBAMAZEPINE LEVEL, TOTAL   CRITICAL CARE Performed by: Ulice DashJohnathan E Williams   Total critical care time: 30 minutes  Critical care time was exclusive of separately billable procedures and treating other patients.  Critical care was necessary to treat or prevent imminent or life-threatening deterioration.  Critical care was time spent personally by me on the following activities: development of treatment plan with patient and/or surrogate as well as nursing, discussions with consultants, evaluation of patient's response to treatment, examination of patient, obtaining history from patient or surrogate, ordering and performing treatments and interventions, ordering and review of laboratory studies, ordering and review of radiographic studies, pulse oximetry and re-evaluation of patient's  condition.  RADIOLOGY Images were viewed by me  Right lower extremity ultrasound, left-sided rib x-rays IMPRESSION: 1. Negative for pneumothorax or pleural effusion. Acute mildly displaced left eighth rib fracture 2. Bilateral interstitial opacity with patchy confluent airspace disease in the right thorax suspicious for pneumonia. ____________________________________________  DIFFERENTIAL DIAGNOSIS   DVT, peripheral edema, renal failure, cellulitis, rib fracture, rib contusion  FINAL ASSESSMENT AND PLAN  Edema, rib fracture, likely community-acquired pneumonia, hyponatremia   Plan: The patient had presented for right lower extremity edema. Patient's labs did reveal leukocytosis which I suspect is coming from what appears to be pneumonia although he has not complained much of cough, family states he has been coughing for the past week. Patient's imaging revealed pneumonia and left eighth rib fracture.  Ultrasound was negative for DVT, but his labs did reveal significant abnormalities including hyponatremia and hypochloremia.  There is no elevation in his potassium.  I have ordered IV antibiotics as well as gentle saline infusion.  I will discuss with the hospitalist for admission.   Johnathan E  Mayford Knife, MD   Note: This note was generated in part or whole with voice recognition software. Voice recognition is usually quite accurate but there are transcription errors that can and very often do occur. I apologize for any typographical errors that were not detected and corrected.     Emily Filbert, MD 06/01/18 2226

## 2018-06-01 NOTE — ED Notes (Signed)
Date and time results received: 06/01/18 22:08 (use smartphrase ".now" to insert current time)  Test: sodium Critical Value: 110  Name of Provider Notified: Dr. Mayford KnifeWilliams  Orders Received? Or Actions Taken?: acknowledged

## 2018-06-01 NOTE — ED Notes (Signed)
ED Provider at bedside. 

## 2018-06-01 NOTE — H&P (Signed)
[facility] - [place] at [facility]   PATIENT NAME: [name]    MR#:  [id number]  DATE OF BIRTH:  04/10/1971  DATE OF ADMISSION:  06/01/2018  PRIMARY CARE PHYSICIAN: [facility]   REQUESTING/REFERRING PHYSICIAN: Mayford Knife, MD  CHIEF COMPLAINT:   Chief Complaint  Patient presents with  . Leg Swelling    HISTORY OF PRESENT ILLNESS:  [name]  is a 47 y.o. male who presents with chief complaint as above.  Patient presents with complaint of right lower extremity edema with developing ulcers.  Patient states that his leg is been significantly swollen for the past week to week and a half.  He developed ulcerative wounds, and he feels that they may be infected.  He is also had a cough for the last several days, and some mild episodes of wheezing.  Here in the ED was found to have leukocytosis with bandemia.  Given the above symptoms hospitalist were called for admission.  PAST MEDICAL HISTORY:   Past Medical History:  Diagnosis Date  . Asthma   . Collagen vascular disease (HCC)   . COPD (chronic obstructive pulmonary disease) (HCC)   . Diabetes mellitus without complication (HCC)   . Gastric ulcer   . Hypertension   . Myocardial infarction (HCC)   . Renal disorder   . Thrombocytopenia (HCC)      PAST SURGICAL HISTORY:   Past Surgical History:  Procedure Laterality Date  . CHOLECYSTECTOMY    . FOOT SURGERY Left   . left below knee amputation       SOCIAL HISTORY:   Social History   Tobacco Use  . Smoking status: Current Every Day Smoker    Packs/day: 1.00    Types: Cigarettes  . Smokeless tobacco: Never Used  Substance Use Topics  . Alcohol use: No     FAMILY HISTORY:   Family History  Adopted: Yes    DRUG ALLERGIES:   Allergies  Allergen Reactions  . Demerol [Meperidine] Anaphylaxis  . Arithmin [Antazoline]   . Erythromycin     MEDICATIONS AT HOME:   Prior to Admission medications    Medication Sig Start Date End Date Taking? Authorizing Provider  acetaminophen (TYLENOL) 500 MG tablet Take 1,000 mg by mouth every 6 (six) hours as needed for mild pain.  05/09/12   [provider]  albuterol (PROAIR HFA) 108 (90 BASE) MCG/ACT inhaler Inhale 2 puffs into the lungs every 4 (four) hours as needed for wheezing or shortness of breath.  08/20/14   [provider]  carbamazepine (TEGRETOL) 200 MG tablet Take 1 tablet by mouth 2 (two) times daily.     [provider]  carvedilol (COREG) 12.5 MG tablet Take 1 tablet by mouth 2 (two) times daily.    [provider]  cholecalciferol (VITAMIN D) 1000 units tablet Take 1,000 Units by mouth daily.    [provider]  collagenase (SANTYL) ointment Apply 1 application topically 2 (two) times daily. 02/23/18   Triplett, Rulon Eisenmenger B, FNP  ferrous sulfate 325 (65 FE) MG EC tablet Take 325 mg by mouth daily with breakfast.    [provider]  gabapentin (NEURONTIN) 300 MG capsule Take 2 capsules by mouth 3 (three) times daily. 03/09/15 03/08/16  [provider]  insulin aspart (NOVOLOG) 100 UNIT/ML injection Inject 50 Units into the skin See admin instructions. Per sliding scale    [provider]  insulin glargine (LANTUS) 100 UNIT/ML injection Inject 10 Units  into the skin at bedtime.    [provider]  isosorbide mononitrate (IMDUR) 30 MG 24 hr tablet Take 1 tablet by mouth 2 (two) times daily.    [provider]  morphine (MS CONTIN) 30 MG 12 hr tablet Take 30 mg by mouth every 12 (twelve) hours.    [provider]  Multiple Vitamins-Minerals (MULTIVITAMIN WITH MINERALS) tablet Take 1 tablet by mouth daily.    [provider]  oxycodone (OXY-IR) 5 MG capsule Take 1 capsule (5 mg total) by mouth every 4 (four) hours as needed. Patient taking differently: Take 15 mg by mouth 3 (three) times daily.  09/26/15   Tommi Rumps, PA-C  pantoprazole  (PROTONIX) 40 MG tablet Take 40 mg by mouth 2 (two) times daily. 10/30/14   [provider]  pravastatin (PRAVACHOL) 20 MG tablet Take 1 tablet by mouth at bedtime.    [provider]  QUEtiapine (SEROQUEL) 400 MG tablet Take 1 tablet by mouth at bedtime.    [provider]  sulfamethoxazole-trimethoprim (BACTRIM DS,SEPTRA DS) 800-160 MG tablet Take 1 tablet by mouth 2 (two) times daily. 02/23/18   Triplett, Rulon Eisenmenger B, FNP    REVIEW OF SYSTEMS:  Review of Systems  Constitutional: Negative for chills, fever, malaise/fatigue and weight loss.  HENT: Negative for ear pain, hearing loss and tinnitus.   Eyes: Negative for blurred vision, double vision, pain and redness.  Respiratory: Positive for cough. Negative for hemoptysis and shortness of breath.   Cardiovascular: Positive for leg swelling. Negative for chest pain, palpitations and orthopnea.  Gastrointestinal: Negative for abdominal pain, constipation, diarrhea, nausea and vomiting.  Genitourinary: Negative for dysuria, frequency and hematuria.  Musculoskeletal: Negative for back pain, joint pain and neck pain.  Skin:       No acne, rash, or lesions  Neurological: Negative for dizziness, tremors, focal weakness and weakness.  Endo/Heme/Allergies: Negative for polydipsia. Does not bruise/bleed easily.  Psychiatric/Behavioral: Negative for depression. The patient is not nervous/anxious and does not have insomnia.      VITAL SIGNS:   Vitals:   06/01/18 2050 06/01/18 2051 06/01/18 2052  BP: 122/77    Pulse: 96    Resp: 17    Temp:   98.2 F (36.8 C)  TempSrc:   Oral  SpO2: 100%    Weight:  74.8 kg   Height:  6\' 1"  (1.854 m)    Wt Readings from Last 3 Encounters:  06/01/18 74.8 kg  02/23/18 81.6 kg  12/09/16 83.9 kg    PHYSICAL EXAMINATION:  Physical Exam  Vitals reviewed. Constitutional: He is oriented to person, place, and time. He appears well-developed and well-nourished. No distress.  HENT:  Head:  Normocephalic and atraumatic.  Mouth/Throat: Oropharynx is clear and moist.  Eyes: Pupils are equal, round, and reactive to light. Conjunctivae and EOM are normal. No scleral icterus.  Neck: Normal range of motion. Neck supple. No JVD present. No thyromegaly present.  Cardiovascular: Normal rate, regular rhythm and intact distal pulses. Exam reveals no gallop and no friction rub.  No murmur heard. Respiratory: Effort normal. No respiratory distress. He has no wheezes. He has rales.  GI: Soft. Bowel sounds are normal. He exhibits no distension. There is no tenderness.  Musculoskeletal: Normal range of motion. He exhibits no edema.  No arthritis, no gout  Lymphadenopathy:    He has no cervical adenopathy.  Neurological: He is alert and oriented to person, place, and time. No cranial nerve deficit.  No dysarthria,  no aphasia  Skin: Skin is warm and dry. No rash noted. There is erythema (With some swelling and some open wounds with mild exudate right lower extremity).  Psychiatric: He has a normal mood and affect. His behavior is normal. Judgment and thought content normal.    LABORATORY PANEL:   CBC Recent Labs  Lab 06/01/18 2112  WBC 16.7*  HGB 12.5*  HCT 35.4*  PLT 338   ------------------------------------------------------------------------------------------------------------------  Chemistries  Recent Labs  Lab 06/01/18 2112  NA 110*  K 5.5*  CL 77*  CO2 22  GLUCOSE 117*  BUN 7  CREATININE 0.78  CALCIUM 8.2*  AST 69*  ALT 22  ALKPHOS 257*  BILITOT 1.3*   ------------------------------------------------------------------------------------------------------------------  Cardiac Enzymes No results for input(s): TROPONINI in the last 168 hours. ------------------------------------------------------------------------------------------------------------------  RADIOLOGY:  Dg Ribs Unilateral W/chest Left  Result Date: 06/01/2018 CLINICAL DATA:  Fall with left-sided  rib pain EXAM: LEFT RIBS AND CHEST - 3+ VIEW COMPARISON:  08/19/2015 FINDINGS: Single view chest demonstrates diffuse bilateral interstitial opacity with patchy and slightly nodular appearing more confluent airspace disease in the right thorax. Normal heart size. Aortic atherosclerosis. Left rib series demonstrates acute mildly displaced left eighth lateral rib fracture. IMPRESSION: 1. Negative for pneumothorax or pleural effusion. Acute mildly displaced left eighth rib fracture 2. Bilateral interstitial opacity with patchy confluent airspace disease in the right thorax suspicious for pneumonia. Electronically Signed   By: Jasmine Pang M.D.   On: 06/01/2018 21:26   US Venous Img Lower Unilateral Right  Result Date: 06/01/2018 CLINICAL DATA:  RIGHT leg swelling for 2 days. History of thrombophlebitis. EXAM: RIGHT  LOWER EXTREMITY VENOUS DOPPLER ULTRASOUND TECHNIQUE: Gray-scale sonography with graded compression, as well as color Doppler and duplex ultrasound were performed to evaluate the lower extremity deep venous systems from the level of the common femoral vein and including the common femoral, femoral, profunda femoral, popliteal and calf veins including the posterior tibial, peroneal and gastrocnemius veins when visible. The superficial great saphenous vein was also interrogated. Spectral Doppler was utilized to evaluate flow at rest and with distal augmentation maneuvers in the common femoral, femoral and popliteal veins. COMPARISON:  RIGHT lower extremity venous ultrasound April 26, 2012 FINDINGS: Contralateral Common Femoral Vein: Respiratory phasicity is normal and symmetric with the symptomatic side. No evidence of thrombus. Normal compressibility. Common Femoral Vein: No evidence of thrombus. Normal compressibility, respiratory phasicity and response to augmentation. Saphenofemoral Junction: No evidence of thrombus. Normal compressibility and flow on color Doppler imaging. Profunda Femoral Vein: No  evidence of thrombus. Normal compressibility and flow on color Doppler imaging. Femoral Vein: No evidence of thrombus. Normal compressibility, respiratory phasicity and response to augmentation. Popliteal Vein: No evidence of thrombus. Normal compressibility, respiratory phasicity and response to augmentation. Calf Veins: No evidence of thrombus. Normal compressibility and flow on color Doppler imaging. Other Findings:  Mild interstitial edema. IMPRESSION: 1. No RIGHT lower extremity deep vein thrombosis. 2. Mild interstitial edema. Electronically Signed   By: Awilda Metro M.D.   On: 06/01/2018 22:53    EKG:   Orders placed or performed during the hospital encounter of 07/25/16  . EKG 12-Lead  . EKG 12-Lead  . EKG    IMPRESSION AND PLAN:  Principal Problem:   Hyponatremia -patient states that this is a chronic issue for him, though in our record he seems to had normal hemoglobin levels last year.  He denies any alcohol use for a long time.  We will start by hydrating with  IV fluids tonight, monitor sodium level Active Problems:   CAP (community acquired pneumonia) -antibiotics, supportive treatment PRN   DM (diabetes mellitus) (HCC) -sliding scale insulin with corresponding glucose checks   HTN (hypertension) -continue home meds   COPD (chronic obstructive pulmonary disease) (HCC) -home dose inhalers   GERD (gastroesophageal reflux disease) -Home dose PPI   Chart review performed and case discussed with ED provider. Labs, imaging and/or ECG reviewed by provider and discussed with patient/family. Management plans discussed with the patient and/or family.  DVT PROPHYLAXIS: SubQ lovenox   GI PROPHYLAXIS:  PPI   ADMISSION STATUS: Inpatient     CODE STATUS: Full Code Status History    Date Active Date Inactive Code Status Order ID Comments User Context   05/08/2015 0817 05/08/2015 1948 Full Code [id number]  Crissie Figureseddy, Edavally N, MD Inpatient      TOTAL TIME TAKING CARE OF THIS  PATIENT: 45 minutes.   Dawan Farney FIELDING 06/01/2018, 11:00 PM  [facility]  Office  [phone]  CC: Primary care physician; [facility]  Note:  This document was prepared using Conservation officer, historic buildingsDragon voice recognition software and may include unintentional dictation errors.

## 2018-06-01 NOTE — ED Triage Notes (Signed)
Pt comes into the ED via POV c/o right leg swelling that has been ongoing for a couple of days.  Patient states he is a diabetic and now has new wounds present on the right calf that are weeping.  Patient denies any known CHF.  Patient states he also fell the other day and is concerned about his ribs as well.  Patient has even and unlabored respirations at this time and appears in NAD.  Patient already has an above the knee amputation on the left due to an infection in the past.

## 2018-06-01 NOTE — ED Notes (Signed)
Patient transported to Ultrasound 

## 2018-06-01 NOTE — ED Notes (Signed)
Patient transported to X-ray 

## 2018-06-01 NOTE — ED Notes (Signed)
Per Dr. Anne HahnWillis, okay for one set of blood cultures due to pt difficult stick.

## 2018-06-02 ENCOUNTER — Inpatient Hospital Stay (HOSPITAL_COMMUNITY)
Admit: 2018-06-02 | Discharge: 2018-06-02 | Disposition: A | Discharge status: Home / Self Care | Payer: Medicare HMO | Attending: Internal Medicine | Admitting: Internal Medicine

## 2018-06-02 DIAGNOSIS — I340 Nonrheumatic mitral (valve) insufficiency: Secondary | ICD-10-CM

## 2018-06-02 LAB — CBC
HCT: 29.2 % — ABNORMAL LOW (ref 40.0–52.0)
Hemoglobin: 10.3 g/dL — ABNORMAL LOW (ref 13.0–18.0)
MCH: 36.2 pg — AB (ref 26.0–34.0)
MCHC: 35.2 g/dL (ref 32.0–36.0)
MCV: 102.7 fL — AB (ref 80.0–100.0)
Platelets: 335 10*3/uL (ref 150–440)
RBC: 2.84 MIL/uL — AB (ref 4.40–5.90)
RDW: 13.9 % (ref 11.5–14.5)
WBC: 19 10*3/uL — ABNORMAL HIGH (ref 3.8–10.6)

## 2018-06-02 LAB — BASIC METABOLIC PANEL
Anion gap: 8 (ref 5–15)
BUN: 6 mg/dL (ref 6–20)
CHLORIDE: 88 mmol/L — AB (ref 98–111)
CO2: 27 mmol/L (ref 22–32)
Calcium: 7.9 mg/dL — ABNORMAL LOW (ref 8.9–10.3)
Creatinine, Ser: 0.78 mg/dL (ref 0.61–1.24)
GFR calc non Af Amer: >60 mL/min (ref >60)
Glucose, Bld: 84 mg/dL (ref 70–99)
Potassium: 4.5 mmol/L (ref 3.5–5.1)
Sodium: 123 mmol/L — ABNORMAL LOW (ref 135–145)

## 2018-06-02 LAB — ECHOCARDIOGRAM COMPLETE
Height: 73 in
Weight: 2771.2 oz

## 2018-06-02 LAB — GLUCOSE, CAPILLARY
GLUCOSE-CAPILLARY: 107 mg/dL — AB (ref 70–99)
GLUCOSE-CAPILLARY: 119 mg/dL — AB (ref 70–99)
Glucose-Capillary: 86 mg/dL (ref 70–99)
Glucose-Capillary: 82 mg/dL (ref 70–99)
Glucose-Capillary: 110 mg/dL — ABNORMAL HIGH (ref 70–99)

## 2018-06-02 LAB — SODIUM: SODIUM: 129 mmol/L — AB (ref 135–145)

## 2018-06-02 LAB — CARBAMAZEPINE LEVEL, TOTAL

## 2018-06-02 LAB — ETHANOL: Alcohol, Ethyl (B): <10 mg/dL (ref <10)

## 2018-06-02 MED ORDER — PRAVASTATIN SODIUM 20 MG PO TABS
20.0000 mg | ORAL_TABLET | Freq: Every day | ORAL | Status: DC
  Administered 2018-06-02 (×2): 20 mg via ORAL
  Filled 2018-06-02 (×2): qty 1

## 2018-06-02 MED ORDER — QUETIAPINE FUMARATE 200 MG PO TABS
200.0000 mg | ORAL_TABLET | Freq: Every day | ORAL | Status: DC
  Administered 2018-06-02 (×2): 200 mg via ORAL
  Filled 2018-06-02 (×3): qty 1

## 2018-06-02 MED ORDER — OXYCODONE HCL 5 MG PO TABS: 15.0000 mg | ORAL_TABLET | Freq: Three times a day (TID) | ORAL | Status: DC | PRN

## 2018-06-02 MED ORDER — ISOSORBIDE MONONITRATE ER 30 MG PO TB24
30.0000 mg | ORAL_TABLET | Freq: Every day | ORAL | Status: DC
  Administered 2018-06-03: 30 mg via ORAL
  Filled 2018-06-02: qty 1

## 2018-06-02 MED ORDER — VANCOMYCIN HCL IN DEXTROSE 1-5 GM/200ML-% IV SOLN
1000.0000 mg | Freq: Three times a day (TID) | INTRAVENOUS | Status: DC
  Administered 2018-06-02 – 2018-06-03 (×2): 1000 mg via INTRAVENOUS
  Filled 2018-06-02 (×5): qty 200

## 2018-06-02 MED ORDER — SODIUM CHLORIDE 0.9 % IV BOLUS
1000.0000 mL | Freq: Once | INTRAVENOUS | Status: AC
  Administered 2018-06-02: 1000 mL via INTRAVENOUS

## 2018-06-02 MED ORDER — CARBAMAZEPINE 200 MG PO TABS
200.0000 mg | ORAL_TABLET | Freq: Two times a day (BID) | ORAL | Status: DC
  Administered 2018-06-02 – 2018-06-03 (×4): 200 mg via ORAL
  Filled 2018-06-02 (×5): qty 1

## 2018-06-02 MED ORDER — ONDANSETRON HCL 4 MG PO TABS: 4.0000 mg | ORAL_TABLET | Freq: Four times a day (QID) | ORAL | Status: DC | PRN

## 2018-06-02 MED ORDER — INSULIN ASPART 100 UNIT/ML ~~LOC~~ SOLN: 0.0000 [IU] | Freq: Three times a day (TID) | SUBCUTANEOUS | Status: DC

## 2018-06-02 MED ORDER — ENOXAPARIN SODIUM 40 MG/0.4ML ~~LOC~~ SOLN
40.0000 mg | SUBCUTANEOUS | Status: DC
  Administered 2018-06-02: 40 mg via SUBCUTANEOUS
  Filled 2018-06-02: qty 0.4

## 2018-06-02 MED ORDER — SODIUM CHLORIDE 0.9 % IV BOLUS: 1000.0000 mL | Freq: Once | INTRAVENOUS | Status: AC

## 2018-06-02 MED ORDER — PANTOPRAZOLE SODIUM 40 MG PO TBEC
40.0000 mg | DELAYED_RELEASE_TABLET | Freq: Two times a day (BID) | ORAL | Status: DC
  Administered 2018-06-02 – 2018-06-03 (×4): 40 mg via ORAL
  Filled 2018-06-02 (×4): qty 1

## 2018-06-02 MED ORDER — VANCOMYCIN HCL IN DEXTROSE 1-5 GM/200ML-% IV SOLN
1000.0000 mg | Freq: Once | INTRAVENOUS | Status: AC
  Administered 2018-06-02: 1000 mg via INTRAVENOUS
  Filled 2018-06-02: qty 200

## 2018-06-02 MED ORDER — SODIUM CHLORIDE 0.9 % IV SOLN
INTRAVENOUS | Status: DC
  Administered 2018-06-02: 02:00:00 via INTRAVENOUS

## 2018-06-02 MED ORDER — ACETAMINOPHEN 325 MG PO TABS
650.0000 mg | ORAL_TABLET | Freq: Four times a day (QID) | ORAL | Status: DC | PRN
  Administered 2018-06-02 – 2018-06-03 (×2): 650 mg via ORAL
  Filled 2018-06-02 (×2): qty 2

## 2018-06-02 MED ORDER — ONDANSETRON HCL 4 MG/2ML IJ SOLN: 4.0000 mg | Freq: Four times a day (QID) | INTRAMUSCULAR | Status: DC | PRN

## 2018-06-02 MED ORDER — SODIUM CHLORIDE 0.9 % IV SOLN
1.0000 g | INTRAVENOUS | Status: DC
  Administered 2018-06-02 – 2018-06-03 (×2): 1 g via INTRAVENOUS
  Filled 2018-06-02: qty 10
  Filled 2018-06-02: qty 1

## 2018-06-02 MED ORDER — IBUPROFEN 400 MG PO TABS
400.0000 mg | ORAL_TABLET | Freq: Four times a day (QID) | ORAL | Status: DC | PRN
  Administered 2018-06-02: 400 mg via ORAL
  Filled 2018-06-02: qty 1

## 2018-06-02 MED ORDER — MORPHINE SULFATE ER 30 MG PO TBCR
30.0000 mg | EXTENDED_RELEASE_TABLET | Freq: Two times a day (BID) | ORAL | Status: DC
Start: 2018-06-02 — End: 2018-06-03
  Administered 2018-06-02 – 2018-06-03 (×4): 30 mg via ORAL
  Filled 2018-06-02 (×4): qty 1

## 2018-06-02 MED ORDER — CARVEDILOL 12.5 MG PO TABS
12.5000 mg | ORAL_TABLET | Freq: Two times a day (BID) | ORAL | Status: DC
  Administered 2018-06-03: 12.5 mg via ORAL
  Filled 2018-06-02 (×2): qty 1

## 2018-06-02 MED ORDER — INSULIN ASPART 100 UNIT/ML ~~LOC~~ SOLN: 0.0000 [IU] | Freq: Every day | SUBCUTANEOUS | Status: DC

## 2018-06-02 NOTE — Consult Note (Signed)
Pharmacy Antibiotic Note  [name] is a 47 y.o. male admitted on 06/01/2018 with cellulitis.  Pharmacy has been consulted for Vancomycin dosing. Patient also receiving ceftriaxone 1g IV every 24 hours.   Plan: Ke: 0.104   T1/2: 7   Vd: 54.6  Vancomycin 1000 IV every 8 hours with 6 hour stack dosing.  Goal trough 10-15 mcg/mL. Calculated trough at Css 13.8. Trough level ordered prior to 4th dose.   Height: 6\' 1"  (185.4 cm) Weight: 173 lb 3.2 oz (78.6 kg) IBW/kg (Calculated) : 79.9  Temp (24hrs), Avg:100 F (37.8 C), Min:98.2 F (36.8 C), Max:102.5 F (39.2 C)  Recent Labs  Lab 06/01/18 2112 06/02/18 0408  WBC 16.7* 19.0*  CREATININE 0.78 0.78    Estimated Creatinine Clearance: 126.9 mL/min (by C-G formula based on SCr of 0.78 mg/dL).    Allergies  Allergen Reactions  . Demerol [Meperidine] Anaphylaxis  . Arithmin [Antazoline]   . Erythromycin     Antimicrobials this admission: 9/19 azithromycin 1000mg  x 1 dose  9/19 ceftriaxone>> 9/19 Vancomycin>>   Microbiology results: 9/19 BCx: Pending   Thank you for allowing pharmacy to be a part of this patient's care.  Gardner CandleSheema M Jorgia Manthei, PharmD, BCPS Clinical Pharmacist 06/02/2018 12:14 PM

## 2018-06-02 NOTE — Progress Notes (Signed)
Family Meeting Note  Advance Directive:yes  Today a meeting took place with the Patient.  The following clinical team members were present during this meeting:MD  The following were discussed:Patient's diagnosis: Sepsis with pneumonia and cellulitis, Patient's progosis: > 12 months and Goals for treatment: Full Code  Additional follow-up to be provided: Patient has updated advanced directives and is full code  Time spent during discussion: 16 minutes  Jadira Nierman, MD

## 2018-06-02 NOTE — Progress Notes (Signed)
Nephrology consult called in to Dr. Ronn MelenaKolloru.

## 2018-06-02 NOTE — Consult Note (Signed)
[facility]  CONSULT NOTE    Date: 06/02/2018                  Patient Name:  [name]  MRN: [id number]  DOB: [birth year]  Age / Sex: 47 y.o., male         PCP: [facility]                 Service Requesting Consult: Dr. Juliene Pina                 Reason for Consult: hyponatremia            History of Present Illness: [name] is a 47 y.o. white male presents with leg swelling. Found to have hyponatremia, rib fractures and pneumonia.   Nephrology consulted for hyponatremia.   Medications: Outpatient medications: Medications Prior to Admission  Medication Sig Dispense Refill Last Dose  . acetaminophen (TYLENOL) 500 MG tablet Take 1,000 mg by mouth every 6 (six) hours as needed for mild pain.    prn  . albuterol (PROAIR HFA) 108 (90 BASE) MCG/ACT inhaler Inhale 2 puffs into the lungs every 4 (four) hours as needed for wheezing or shortness of breath.    prn at Unknown time  . carbamazepine (TEGRETOL) 200 MG tablet Take 1 tablet by mouth 2 (two) times daily.    07/24/2016 at Unknown time  . carvedilol (COREG) 12.5 MG tablet Take 1 tablet by mouth 2 (two) times daily.   07/24/2016 at Unknown time  . cholecalciferol (VITAMIN D) 1000 units tablet Take 1,000 Units by mouth daily.   07/24/2016 at Unknown time  . collagenase (SANTYL) ointment Apply 1 application topically 2 (two) times daily. 15 g 0   . ferrous sulfate 325 (65 FE) MG EC tablet Take 325 mg by mouth daily with breakfast.   07/24/2016 at Unknown time  . gabapentin (NEURONTIN) 300 MG capsule Take 2 capsules by mouth 3 (three) times daily.   11/10/2015 at Unknown time  . insulin aspart (NOVOLOG) 100 UNIT/ML injection Inject 50 Units into the skin See admin instructions. Per sliding scale   07/24/2016 at Unknown time  . insulin glargine (LANTUS) 100 UNIT/ML injection Inject 10 Units into the skin at bedtime.   07/24/2016 at Unknown time  . isosorbide mononitrate (IMDUR) 30 MG  24 hr tablet Take 1 tablet by mouth 2 (two) times daily.   07/24/2016 at Unknown time  . morphine (MS CONTIN) 30 MG 12 hr tablet Take 30 mg by mouth every 12 (twelve) hours.   07/24/2016 at Unknown time  . Multiple Vitamins-Minerals (MULTIVITAMIN WITH MINERALS) tablet Take 1 tablet by mouth daily.   07/24/2016 at Unknown time  . oxycodone (OXY-IR) 5 MG capsule Take 1 capsule (5 mg total) by mouth every 4 (four) hours as needed. (Patient taking differently: Take 15 mg by mouth 3 (three) times daily. ) 5 capsule 0 07/24/2016 at Unknown time  . pantoprazole (PROTONIX) 40 MG tablet Take 40 mg by mouth 2 (two) times daily.   07/24/2016 at Unknown time  . pravastatin (PRAVACHOL) 20 MG tablet Take 1 tablet by mouth at bedtime.   07/24/2016 at Unknown time  . QUEtiapine (SEROQUEL) 400 MG tablet Take 1 tablet by mouth at bedtime.   07/24/2016 at Unknown time  . sulfamethoxazole-trimethoprim (BACTRIM DS,SEPTRA DS) 800-160 MG tablet Take 1 tablet by mouth 2 (two) times daily. (Patient not taking: Reported on 06/02/2018) 20 tablet 0 Not Taking  at Unknown time    Current medications: Current Facility-Administered Medications  Medication Dose Route Frequency Provider Last Rate Last Dose  . acetaminophen (TYLENOL) tablet 650 mg  650 mg Oral Q6H PRN Adrian Saran, MD   650 mg at 06/02/18 1018  . carbamazepine (TEGRETOL) tablet 200 mg  200 mg Oral BID Oralia Manis, MD   200 mg at 06/02/18 1018  . carvedilol (COREG) tablet 12.5 mg  12.5 mg Oral BID Oralia Manis, MD      . cefTRIAXone (ROCEPHIN) 1 g in sodium chloride 0.9 % 100 mL IVPB  1 g Intravenous Q24H Oralia Manis, MD 200 mL/hr at 06/02/18 0306 1 g at 06/02/18 0306  . enoxaparin (LOVENOX) injection 40 mg  40 mg Subcutaneous Q24H Oralia Manis, MD      . insulin aspart (novoLOG) injection 0-5 Units  0-5 Units Subcutaneous QHS Oralia Manis, MD      . insulin aspart (novoLOG) injection 0-9 Units  0-9 Units Subcutaneous TID WC Oralia Manis, MD      . isosorbide  mononitrate (IMDUR) 24 hr tablet 30 mg  30 mg Oral Daily Oralia Manis, MD      . morphine (MS CONTIN) 12 hr tablet 30 mg  30 mg Oral Rigoberto Noel, MD   30 mg at 06/02/18 1018  . ondansetron (ZOFRAN) tablet 4 mg  4 mg Oral Q6H PRN Oralia Manis, MD       Or  . ondansetron [facility]) injection 4 mg  4 mg Intravenous Q6H PRN Oralia Manis, MD      . oxyCODONE (Oxy IR/ROXICODONE) immediate release tablet 15 mg  15 mg Oral Q8H PRN Oralia Manis, MD      . pantoprazole (PROTONIX) EC tablet 40 mg  40 mg Oral BID Oralia Manis, MD   40 mg at 06/02/18 1018  . pravastatin (PRAVACHOL) tablet 20 mg  20 mg Oral Lamont Snowball, MD   20 mg at 06/02/18 1610  . QUEtiapine (SEROQUEL) tablet 200 mg  200 mg Oral Lamont Snowball, MD   200 mg at 06/02/18 0303  . vancomycin (VANCOCIN) IVPB 1000 mg/200 mL premix  1,000 mg Intravenous Q8H Hallaji, Sheema M, RPH          Allergies: Allergies  Allergen Reactions  . Demerol [Meperidine] Anaphylaxis  . Arithmin [Antazoline]   . Erythromycin       Past Medical History: Past Medical History:  Diagnosis Date  . Asthma   . Collagen vascular disease (HCC)   . COPD (chronic obstructive pulmonary disease) (HCC)   . Diabetes mellitus without complication (HCC)   . Gastric ulcer   . Hypertension   . Myocardial infarction (HCC)   . Renal disorder   . Thrombocytopenia (HCC)      Past Surgical History: Past Surgical History:  Procedure Laterality Date  . CHOLECYSTECTOMY    . FOOT SURGERY Left   . left below knee amputation       Family History: Family History  Adopted: Yes     Social History: Social History   Socioeconomic History  . Marital status: Married    Spouse name: Not on file  . Number of children: Not on file  . Years of education: Not on file  . Highest education level: Not on file  Occupational History  . Not on file  Social Needs  . Financial resource strain: Not on file  . Food insecurity:    Worry: Not on file     Inability: Not  on file  . Transportation needs:    Medical: Not on file    Non-medical: Not on file  Tobacco Use  . Smoking status: Current Every Day Smoker    Packs/day: 1.00    Types: Cigarettes  . Smokeless tobacco: Never Used  Substance and Sexual Activity  . Alcohol use: No  . Drug use: No  . Sexual activity: Not on file  Lifestyle  . Physical activity:    Days per week: Not on file    Minutes per session: Not on file  . Stress: Not on file  Relationships  . Social connections:    Talks on phone: Not on file    Gets together: Not on file    Attends religious service: Not on file    Active member of club or organization: Not on file    Attends meetings of clubs or organizations: Not on file    Relationship status: Not on file  . Intimate partner violence:    Fear of current or ex partner: Not on file    Emotionally abused: Not on file    Physically abused: Not on file    Forced sexual activity: Not on file  Other Topics Concern  . Not on file  Social History Narrative  . Not on file     Review of Systems: Review of Systems  Constitutional: Negative.  Negative for chills, diaphoresis, fever, malaise/fatigue and weight loss.  HENT: Negative.  Negative for congestion, ear discharge, ear pain, hearing loss, nosebleeds, sinus pain, sore throat and tinnitus.   Eyes: Negative.  Negative for blurred vision, double vision, photophobia, pain, discharge and redness.  Respiratory: Positive for cough, hemoptysis, sputum production, shortness of breath and wheezing. Negative for stridor.   Cardiovascular: Negative.  Negative for chest pain, palpitations, orthopnea, claudication, leg swelling and PND.  Gastrointestinal: Negative.  Negative for abdominal pain, blood in stool, constipation, diarrhea, heartburn, melena, nausea and vomiting.  Genitourinary: Negative.  Negative for dysuria, flank pain, frequency, hematuria and urgency.  Musculoskeletal: Negative.  Negative for back  pain, falls, joint pain, myalgias and neck pain.  Skin: Negative.  Negative for itching and rash.  Neurological: Negative.  Negative for dizziness, tingling, tremors, sensory change, speech change, focal weakness, seizures, loss of consciousness, weakness and headaches.  Endo/Heme/Allergies: Negative.  Negative for environmental allergies and polydipsia. Does not bruise/bleed easily.  Psychiatric/Behavioral: Negative.  Negative for depression, hallucinations, memory loss, substance abuse and suicidal ideas. The patient is not nervous/anxious and does not have insomnia.     Vital Signs: Blood pressure 116/77, pulse 71, temperature 97.8 F (36.6 C), temperature source Oral, resp. rate 19, height 6\' 1"  (1.854 m), weight 78.6 kg, SpO2 99 %.  Weight trends: Filed Weights   06/01/18 2051 06/02/18 0123  Weight: 74.8 kg 78.6 kg    Physical Exam: General: NAD,   Head: Normocephalic, atraumatic. Moist oral mucosal membranes  Eyes: Anicteric, PERRL  Neck: Supple, trachea midline  Lungs:  Clear to auscultation  Heart: Regular rate and rhythm  Abdomen:  Soft, nontender,   Extremities: Left AKA, no peripheral edema.  Neurologic: Nonfocal, moving all four extremities  Skin: No lesions         Lab results: Basic Metabolic Panel: Recent Labs  Lab 06/01/18 2112 06/02/18 0408 06/02/18 1215  NA 110* 123* 129*  K 5.5* 4.5  --   CL 77* 88*  --   CO2 22 27  --   GLUCOSE 117* 84  --   BUN  7 6  --   CREATININE 0.78 0.78  --   CALCIUM 8.2* 7.9*  --     Liver Function Tests: Recent Labs  Lab 06/01/18 2112  AST 69*  ALT 22  ALKPHOS 257*  BILITOT 1.3*  PROT 6.9  ALBUMIN 2.7*   No results for input(s): LIPASE, AMYLASE in the last 168 hours. No results for input(s): AMMONIA in the last 168 hours.  CBC: Recent Labs  Lab 06/01/18 2112 06/02/18 0408  WBC 16.7* 19.0*  NEUTROABS 15.4*  --   HGB 12.5* 10.3*  HCT 35.4* 29.2*  MCV 102.8* 102.7*  PLT 338 335    Cardiac  Enzymes: No results for input(s): CKTOTAL, CKMB, CKMBINDEX, TROPONINI in the last 168 hours.  BNP: Invalid input(s): POCBNP  CBG: Recent Labs  Lab 06/02/18 0118 06/02/18 0757 06/02/18 1156  GLUCAP 86 82 107*    Microbiology: Results for orders placed or performed during the hospital encounter of 06/01/18  Blood culture (routine x 2)     Status: None (Preliminary result)   Collection Time: 06/01/18 11:23 PM  Result Value Ref Range Status   Specimen Description BLOOD RIGHT ASSIST CONTROL  Final   Special Requests   Final    BOTTLES DRAWN AEROBIC AND ANAEROBIC Blood Culture results may not be optimal due to an excessive volume of blood received in culture bottles   Culture   Final    NO GROWTH < 12 HOURS Performed at [facility], [address] [place], [place] [address]    Report Status PENDING  Incomplete  Blood culture (routine x 2)     Status: None (Preliminary result)   Collection Time: 06/02/18  3:34 AM  Result Value Ref Range Status   Specimen Description BLOOD LEFT ARM  Final   Special Requests   Final    BOTTLES DRAWN AEROBIC AND ANAEROBIC Blood Culture adequate volume   Culture   Final    NO GROWTH <12 HOURS Performed at [facility], [address] [place], [place] [address]    Report Status PENDING  Incomplete    Coagulation Studies: No results for input(s): LABPROT, INR in the last 72 hours.  Urinalysis: No results for input(s): COLORURINE, LABSPEC, PHURINE, GLUCOSEU, HGBUR, BILIRUBINUR, KETONESUR, PROTEINUR, UROBILINOGEN, NITRITE, LEUKOCYTESUR in the last 72 hours.  Invalid input(s): APPERANCEUR    Imaging: Dg Ribs Unilateral W/chest Left  Result Date: 06/01/2018 CLINICAL DATA:  Fall with left-sided rib pain EXAM: LEFT RIBS AND CHEST - 3+ VIEW COMPARISON:  08/19/2015 FINDINGS: Single view chest demonstrates diffuse bilateral interstitial opacity with patchy and slightly nodular appearing more confluent airspace disease in the  right thorax. Normal heart size. Aortic atherosclerosis. Left rib series demonstrates acute mildly displaced left eighth lateral rib fracture. IMPRESSION: 1. Negative for pneumothorax or pleural effusion. Acute mildly displaced left eighth rib fracture 2. Bilateral interstitial opacity with patchy confluent airspace disease in the right thorax suspicious for pneumonia. Electronically Signed   By: Jasmine PangKim  Fujinaga M.D.   On: 06/01/2018 21:26   [place] Venous Img Lower Unilateral Right  Result Date: 06/01/2018 CLINICAL DATA:  RIGHT leg swelling for 2 days. History of thrombophlebitis. EXAM: RIGHT  LOWER EXTREMITY VENOUS DOPPLER ULTRASOUND TECHNIQUE: Gray-scale sonography with graded compression, as well as color Doppler and duplex ultrasound were performed to evaluate the lower extremity deep venous systems from the level of the common femoral vein and including the common femoral, femoral, profunda femoral, popliteal and calf veins including the posterior tibial, peroneal and  gastrocnemius veins when visible. The superficial great saphenous vein was also interrogated. Spectral Doppler was utilized to evaluate flow at rest and with distal augmentation maneuvers in the common femoral, femoral and popliteal veins. COMPARISON:  RIGHT lower extremity venous ultrasound April 26, 2012 FINDINGS: Contralateral Common Femoral Vein: Respiratory phasicity is normal and symmetric with the symptomatic side. No evidence of thrombus. Normal compressibility. Common Femoral Vein: No evidence of thrombus. Normal compressibility, respiratory phasicity and response to augmentation. Saphenofemoral Junction: No evidence of thrombus. Normal compressibility and flow on color Doppler imaging. Profunda Femoral Vein: No evidence of thrombus. Normal compressibility and flow on color Doppler imaging. Femoral Vein: No evidence of thrombus. Normal compressibility, respiratory phasicity and response to augmentation. Popliteal Vein: No evidence of  thrombus. Normal compressibility, respiratory phasicity and response to augmentation. Calf Veins: No evidence of thrombus. Normal compressibility and flow on color Doppler imaging. Other Findings:  Mild interstitial edema. IMPRESSION: 1. No RIGHT lower extremity deep vein thrombosis. 2. Mild interstitial edema. Electronically Signed   By: Awilda Metro M.D.   On: 06/01/2018 22:53      Assessment & Plan: [name] is a 47 y.o.  male with coronary artery disease, hypertension, diabetes mellitus type II, peptic ulcer disease, COPD, who was admitted to [facility] on 06/01/2018 for Hyponatremia [E87.1] Peripheral edema [R60.9] Closed fracture of one rib of left side, initial encounter [S22.32XA] Community acquired pneumonia, unspecified laterality [J18.9]   1. Hyponatremia: acute on chronic. History of hyponatremia. However most likely with SIADH and hypovolemia - Improving with IV fluids.    LOS: 1 Mamta Rimmer 9/19/20193:59 PM

## 2018-06-02 NOTE — Consult Note (Signed)
WOC Nurse wound consult note Reason for Consult:Collagen vascular disease.  Edema to right lower leg which patient states is usually indicative of infection.  Found to have CAP. Nonintact lesions to right lower leg and right inner thigh.  Dry and crusted at this time.  He has been using salt water compresses to the areas.  There is edema and erythema more consistent with the autoimmune reaction than cellulitis to this leg.  Positive for pneumonia and negative for DVT.  Wound type: autoimmune/inflammatory Pressure Injury POA: NA Measurement:scattered lesions, 0.5 cm in diameter.  Dry and scabbed to right lower extremity and inner thigh.  Wound ZOX:WRUEAVWbed:scabbed dry Drainage (amount, consistency, odor) none Periwound:edema and erythema to right leg Dressing procedure/placement/frequency:Leave right leg open to air.  Will not follow at this time.  Please re-consult if needed.  Maple HudsonKaren Kaoir Loree MSN, RN, FNP-BC CWON Wound, Ostomy, Continence Nurse Pager [phone]

## 2018-06-02 NOTE — Progress Notes (Signed)
[facility] [place] [facility]   PATIENT NAME: [name]    MR#:  [id number]  DATE OF BIRTH:  July 24, 1971  SUBJECTIVE:   Patient had low blood pressure this morning systolic and 68.  He was asymptomatic. Patient also with fever of 102 this morning.  Patient reports that he fell 3 days ago when asked how he suffered this left rib fracture.  REVIEW OF SYSTEMS:    Review of Systems  Constitutional: Negative for fever, chills weight loss HENT: Negative for ear pain, nosebleeds, congestion, facial swelling, rhinorrhea, neck pain, neck stiffness and ear discharge.   Respiratory: Negative for cough, shortness of breath, wheezing  Cardiovascular: Negative for chest pain, palpitations and leg swelling.  Gastrointestinal: Negative for heartburn, abdominal pain, vomiting, diarrhea or consitpation Genitourinary: Negative for dysuria, urgency, frequency, hematuria Musculoskeletal: Negative for back pain or joint pain Neurological: Negative for dizziness, seizures, syncope, focal weakness,  numbness and headaches.  Hematological: Does not bruise/bleed easily.  Psychiatric/Behavioral: Negative for hallucinations, confusion, dysphoric mood Skin: Right lower extremity erythema and edema   Tolerating Diet: yes      DRUG ALLERGIES:   Allergies  Allergen Reactions  . Demerol [Meperidine] Anaphylaxis  . Arithmin [Antazoline]   . Erythromycin     VITALS:  Blood pressure (!) 94/50, pulse (!) 109, temperature (!) 100.9 F (38.3 C), temperature source Axillary, resp. rate 19, height 6\' 1"  (1.854 m), weight 78.6 kg, SpO2 90 %.  PHYSICAL EXAMINATION:  Constitutional: Appears well-developed and well-nourished. No distress. HENT: Normocephalic. Marland Kitchen. Oropharynx is clear and moist.  Eyes: Conjunctivae and EOM are normal. PERRLA, no scleral icterus.  Neck: Normal ROM. Neck supple. No JVD. No tracheal deviation. CVS: RRR, S1/S2 +, no murmurs, no gallops, no carotid bruit.   Pulmonary: Effort and breath sounds normal, no stridor, rhonchi, wheezes, rales.  Abdominal: Soft. BS +,  no distension, tenderness, rebound or guarding.  Musculoskeletal: Left AKA right lower extremity with erythema and swelling Neuro: Alert. CN 2-12 grossly intact. No focal deficits. Skin: Right lower extremity with erythema and swelling. Psychiatric: Normal mood and affect.      LABORATORY PANEL:   CBC Recent Labs  Lab 06/02/18 0408  WBC 19.0*  HGB 10.3*  HCT 29.2*  PLT 335   ------------------------------------------------------------------------------------------------------------------  Chemistries  Recent Labs  Lab 06/01/18 2112 06/02/18 0408  NA 110* 123*  K 5.5* 4.5  CL 77* 88*  CO2 22 27  GLUCOSE 117* 84  BUN 7 6  CREATININE 0.78 0.78  CALCIUM 8.2* 7.9*  AST 69*  --   ALT 22  --   ALKPHOS 257*  --   BILITOT 1.3*  --    ------------------------------------------------------------------------------------------------------------------  Cardiac Enzymes No results for input(s): TROPONINI in the last 168 hours. ------------------------------------------------------------------------------------------------------------------  RADIOLOGY:  Dg Ribs Unilateral W/chest Left  Result Date: 06/01/2018 CLINICAL DATA:  Fall with left-sided rib pain EXAM: LEFT RIBS AND CHEST - 3+ VIEW COMPARISON:  08/19/2015 FINDINGS: Single view chest demonstrates diffuse bilateral interstitial opacity with patchy and slightly nodular appearing more confluent airspace disease in the right thorax. Normal heart size. Aortic atherosclerosis. Left rib series demonstrates acute mildly displaced left eighth lateral rib fracture. IMPRESSION: 1. Negative for pneumothorax or pleural effusion. Acute mildly displaced left eighth rib fracture 2. Bilateral interstitial opacity with patchy confluent airspace disease in the right thorax suspicious for pneumonia. Electronically Signed   By: Jasmine PangKim  Fujinaga  M.D.   On: 06/01/2018 21:26   [place] Venous Img Lower Unilateral  Right  Result Date: 06/01/2018 CLINICAL DATA:  RIGHT leg swelling for 2 days. History of thrombophlebitis. EXAM: RIGHT  LOWER EXTREMITY VENOUS DOPPLER ULTRASOUND TECHNIQUE: Gray-scale sonography with graded compression, as well as color Doppler and duplex ultrasound were performed to evaluate the lower extremity deep venous systems from the level of the common femoral vein and including the common femoral, femoral, profunda femoral, popliteal and calf veins including the posterior tibial, peroneal and gastrocnemius veins when visible. The superficial great saphenous vein was also interrogated. Spectral Doppler was utilized to evaluate flow at rest and with distal augmentation maneuvers in the common femoral, femoral and popliteal veins. COMPARISON:  RIGHT lower extremity venous ultrasound April 26, 2012 FINDINGS: Contralateral Common Femoral Vein: Respiratory phasicity is normal and symmetric with the symptomatic side. No evidence of thrombus. Normal compressibility. Common Femoral Vein: No evidence of thrombus. Normal compressibility, respiratory phasicity and response to augmentation. Saphenofemoral Junction: No evidence of thrombus. Normal compressibility and flow on color Doppler imaging. Profunda Femoral Vein: No evidence of thrombus. Normal compressibility and flow on color Doppler imaging. Femoral Vein: No evidence of thrombus. Normal compressibility, respiratory phasicity and response to augmentation. Popliteal Vein: No evidence of thrombus. Normal compressibility, respiratory phasicity and response to augmentation. Calf Veins: No evidence of thrombus. Normal compressibility and flow on color Doppler imaging. Other Findings:  Mild interstitial edema. IMPRESSION: 1. No RIGHT lower extremity deep vein thrombosis. 2. Mild interstitial edema. Electronically Signed   By: Awilda Metro M.D.   On: 06/01/2018 22:53     ASSESSMENT AND PLAN:    47 year old male who presented to the ER with right leg swelling and found to have hyponatremia.  1.  Sepsis: This morning patient has hypotension, fever and leukocytosis Sepsis is multifactorial due to community-acquired pneumonia in addition to right lower extremity cellulitis   2.  Hyponatremia: Patient's sodium level went from 110-123 IV fluids stopped for now Repeat sodium in 1130 Nephrology consultation requested  3.  Community-acquired pneumonia: Continue Rocephin and azithromycin Incentive spirometer ordered  4.  Right lower extremity cellulitis: Elevate leg on 2-3 pillows Add vancomycin Lower extremity Doppler was negative for DVT  5.Tobacco dependence: Patient is encouraged to quit smoking. Counseling was provided for 4 minutes.  6.  COPD without signs of exacerbation  7.  Diabetes: Continue sliding scale  8.  History of hypertension with hypotension this morning: Hold all hypertensive medications     Management plans discussed with the patient and he is in agreement.  CODE STATUS: Full  TOTAL TIME TAKING CARE OF THIS PATIENT: 33 minutes.     POSSIBLE D/C 2 to 3 days, DEPENDING ON CLINICAL CONDITION.   Chelsei Mcchesney M.D on 06/02/2018 at 10:36 AM  Between 7am to 6pm - Pager - [phone] After 6pm go to [email or web address] - password [email or web address]  [facility]  Office  [phone]  CC: Primary care physician; [facility]  Note: This dictation was prepared with Dragon dictation along with smaller phrase technology. Any transcriptional errors that result from this process are unintentional.

## 2018-06-02 NOTE — Progress Notes (Signed)
SWOT responded for MEWS of 5. MD paged for tylenol order. Patient receiving 1L NS bolus. Manual B/P checked.  Patient asymptomatic at this time.

## 2018-06-02 NOTE — Progress Notes (Signed)
*  PRELIMINARY RESULTS* Echocardiogram 2D Echocardiogram has been performed.  Cristela BlueHege, Neala Miggins 06/02/2018, 2:27 PM

## 2018-06-03 LAB — BASIC METABOLIC PANEL
ANION GAP: 7 (ref 5–15)
BUN: 6 mg/dL (ref 6–20)
CALCIUM: 7.9 mg/dL — AB (ref 8.9–10.3)
CO2: 26 mmol/L (ref 22–32)
Chloride: 97 mmol/L — ABNORMAL LOW (ref 98–111)
Creatinine, Ser: 0.82 mg/dL (ref 0.61–1.24)
Glucose, Bld: 100 mg/dL — ABNORMAL HIGH (ref 70–99)
POTASSIUM: 4.1 mmol/L (ref 3.5–5.1)
Sodium: 130 mmol/L — ABNORMAL LOW (ref 135–145)

## 2018-06-03 LAB — HIV ANTIBODY (ROUTINE TESTING W REFLEX): HIV Screen 4th Generation wRfx: NONREACTIVE

## 2018-06-03 LAB — GLUCOSE, CAPILLARY: GLUCOSE-CAPILLARY: 75 mg/dL (ref 70–99)

## 2018-06-03 MED ORDER — AMOXICILLIN-POT CLAVULANATE 875-125 MG PO TABS: 1.0000 | ORAL_TABLET | Freq: Two times a day (BID) | ORAL | 0 refills | Status: DC

## 2018-06-03 NOTE — Plan of Care (Signed)
  Problem: Skin Integrity: Goal: Risk for impaired skin integrity will decrease Outcome: Progressing   Problem: Clinical Measurements: Goal: Ability to avoid or minimize complications of infection will improve Outcome: Progressing   Problem: Skin Integrity: Goal: Skin integrity will improve Outcome: Progressing   

## 2018-06-03 NOTE — Discharge Summary (Signed)
[facility] [place]   PATIENT NAME: [name]    MR#:  [id number]  DATE OF BIRTH:  March 21, 1971  DATE OF ADMISSION:  06/01/2018 ADMITTING PHYSICIAN: Oralia Manis, MD  DATE OF DISCHARGE: 06/03/2018  PRIMARY CARE PHYSICIAN: [facility]    ADMISSION DIAGNOSIS:  Hyponatremia [E87.1] Peripheral edema [R60.9] Closed fracture of one rib of left side, initial encounter [S22.32XA] Community acquired pneumonia, unspecified laterality [J18.9]  DISCHARGE DIAGNOSIS:  Principal Problem:   Hyponatremia Active Problems:   DM (diabetes mellitus) (HCC)   HTN (hypertension)   COPD (chronic obstructive pulmonary disease) (HCC)   CAP (community acquired pneumonia)   GERD (gastroesophageal reflux disease)   SECONDARY DIAGNOSIS:   Past Medical History:  Diagnosis Date  . Asthma   . Collagen vascular disease (HCC)   . COPD (chronic obstructive pulmonary disease) (HCC)   . Diabetes mellitus without complication (HCC)   . Gastric ulcer   . Hypertension   . Myocardial infarction (HCC)   . Renal disorder   . Thrombocytopenia [facility])     HOSPITAL COURSE:   47 year old male who presented to the ER with right leg swelling and found to have hyponatremia.  1.  Sepsis: Patient had hypotension, fever and leukocytosis Sepsis is multifactorial due to community-acquired pneumonia in addition to right lower extremity cellulitis   2.  Hyponatremia: Patient has chronic low sodium and is now at baseline. 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   3.  Community-acquired pneumonia: He will be discharged on oral AUGMENTIN.    4.  Right lower extremity cellulitis: Lower extremity Doppler was negative for DVT Augmenti should be ok to treat cellulitis.  5.Tobacco dependence: Patient is encouraged to quit smoking. Counseling was provided for 4 minutes.  6.  COPD without signs of exacerbation  7.  Diabetes: He will continue ADA  diet 8.  History of hypertension with hypotension this morning: His BP has improved and can continue outpatient medications.    DISCHARGE CONDITIONS AND DIET:   Stable diabetic  CONSULTS OBTAINED:  Treatment Team:  Lamont Dowdy, MD  DRUG ALLERGIES:   Allergies  Allergen Reactions  . Demerol [Meperidine] Anaphylaxis  . Arithmin [Antazoline]   . Erythromycin     DISCHARGE MEDICATIONS:   Allergies as of 06/03/2018      Reactions   Demerol [meperidine] Anaphylaxis   Arithmin [antazoline]    Erythromycin       Medication List    STOP taking these medications   sulfamethoxazole-trimethoprim 800-160 MG tablet Commonly known as:  BACTRIM DS,SEPTRA DS     TAKE these medications   acetaminophen 500 MG tablet Commonly known as:  TYLENOL Take 1,000 mg by mouth every 6 (six) hours as needed for mild pain.   amoxicillin-clavulanate 875-125 MG tablet Commonly known as:  AUGMENTIN Take 1 tablet by mouth 2 (two) times daily.   carvedilol 12.5 MG tablet Commonly known as:  COREG Take 1 tablet by mouth 2 (two) times daily.   cholecalciferol 1000 units tablet Commonly known as:  VITAMIN D Take 1,000 Units by mouth daily.   collagenase ointment Commonly known as:  SANTYL Apply 1 application topically 2 (two) times daily.   ferrous sulfate 325 (65 FE) MG EC tablet Take 325 mg by mouth daily with breakfast.   gabapentin 300 MG capsule Commonly known as:  NEURONTIN Take 2 capsules by mouth 3 (three) times daily.   insulin aspart 100 UNIT/ML injection Commonly  known as:  novoLOG Inject 50 Units into the skin See admin instructions. Per sliding scale   insulin glargine 100 UNIT/ML injection Commonly known as:  LANTUS Inject 10 Units into the skin at bedtime.   isosorbide mononitrate 30 MG 24 hr tablet Commonly known as:  IMDUR Take 1 tablet by mouth 2 (two) times daily.   morphine 30 MG 12 hr tablet Commonly known as:  MS CONTIN Take 30 mg by mouth every 12  (twelve) hours.   multivitamin with minerals tablet Take 1 tablet by mouth daily.   oxycodone 5 MG capsule Commonly known as:  OXY-IR Take 1 capsule (5 mg total) by mouth every 4 (four) hours as needed. What changed:    how much to take  when to take this   pantoprazole 40 MG tablet Commonly known as:  PROTONIX Take 40 mg by mouth 2 (two) times daily.   pravastatin 20 MG tablet Commonly known as:  PRAVACHOL Take 1 tablet by mouth at bedtime.   PROAIR HFA 108 (90 Base) MCG/ACT inhaler Generic drug:  albuterol Inhale 2 puffs into the lungs every 4 (four) hours as needed for wheezing or shortness of breath.   SEROQUEL 400 MG tablet Generic drug:  QUEtiapine Take 1 tablet by mouth at bedtime.   TEGRETOL 200 MG tablet Generic drug:  carbamazepine Take 1 tablet by mouth 2 (two) times daily.         Today   CHIEF COMPLAINT:  Doing better this am   VITAL SIGNS:  Blood pressure 108/73, pulse 90, temperature 99.2 F (37.3 C), temperature source Oral, resp. rate 18, height 6\' 1"  (1.854 m), weight 78.6 kg, SpO2 98 %.   REVIEW OF SYSTEMS:  Review of Systems  Constitutional: Negative.  Negative for chills, fever and malaise/fatigue.  HENT: Negative.  Negative for ear discharge, ear pain, hearing loss, nosebleeds and sore throat.   Eyes: Negative.  Negative for blurred vision and pain.  Respiratory: Negative.  Negative for cough, hemoptysis, shortness of breath and wheezing.   Cardiovascular: Negative.  Negative for chest pain, palpitations and leg swelling.  Gastrointestinal: Negative.  Negative for abdominal pain, blood in stool, diarrhea, nausea and vomiting.  Genitourinary: Negative.  Negative for dysuria.  Musculoskeletal: Negative.  Negative for back pain.  Skin: Negative.   Neurological: Negative for dizziness, tremors, speech change, focal weakness, seizures and headaches.  Endo/Heme/Allergies: Negative.  Does not bruise/bleed easily.  Psychiatric/Behavioral:  Negative.  Negative for depression, hallucinations and suicidal ideas.     PHYSICAL EXAMINATION:  GENERAL:  47 y.o.-year-old patient lying in the bed with no acute distress.  NECK:  Supple, no jugular venous distention. No thyroid enlargement, no tenderness.  LUNGS: Normal breath sounds bilaterally, no wheezing, rales,rhonchi  No use of accessory muscles of respiration.  CARDIOVASCULAR: S1, S2 normal. No murmurs, rubs, or gallops.  ABDOMEN: Soft, non-tender, non-distended. Bowel sounds present. No organomegaly or mass.  EXTREMITIES:left AKA right LE cellulitis and edema improved PSYCHIATRIC: The patient is alert and oriented x 3.  SKIN: No obvious rash, lesion, or ulcer.   DATA REVIEW:   CBC Recent Labs  Lab 06/02/18 0408  WBC 19.0*  HGB 10.3*  HCT 29.2*  PLT 335    Chemistries  Recent Labs  Lab 06/01/18 2112  06/03/18 0505  NA 110*   < > 130*  K 5.5*   < > 4.1  CL 77*   < > 97*  CO2 22   < > 26  GLUCOSE 117*   < >  100*  BUN 7   < > 6  CREATININE 0.78   < > 0.82  CALCIUM 8.2*   < > 7.9*  AST 69*  --   --   ALT 22  --   --   ALKPHOS 257*  --   --   BILITOT 1.3*  --   --    < > = values in this interval not displayed.    Cardiac Enzymes No results for input(s): TROPONINI in the last 168 hours.  Microbiology Results  @[email or web address] @  RADIOLOGY:  Dg Ribs Unilateral W/chest Left  Result Date: 06/01/2018 CLINICAL DATA:  Fall with left-sided rib pain EXAM: LEFT RIBS AND CHEST - 3+ VIEW COMPARISON:  08/19/2015 FINDINGS: Single view chest demonstrates diffuse bilateral interstitial opacity with patchy and slightly nodular appearing more confluent airspace disease in the right thorax. Normal heart size. Aortic atherosclerosis. Left rib series demonstrates acute mildly displaced left eighth lateral rib fracture. IMPRESSION: 1. Negative for pneumothorax or pleural effusion. Acute mildly displaced left eighth rib fracture 2. Bilateral interstitial opacity with patchy confluent  airspace disease in the right thorax suspicious for pneumonia. Electronically Signed   By: Jasmine Pang M.D.   On: 06/01/2018 21:26   US Venous Img Lower Unilateral Right  Result Date: 06/01/2018 CLINICAL DATA:  RIGHT leg swelling for 2 days. History of thrombophlebitis. EXAM: RIGHT  LOWER EXTREMITY VENOUS DOPPLER ULTRASOUND TECHNIQUE: Gray-scale sonography with graded compression, as well as color Doppler and duplex ultrasound were performed to evaluate the lower extremity deep venous systems from the level of the common femoral vein and including the common femoral, femoral, profunda femoral, popliteal and calf veins including the posterior tibial, peroneal and gastrocnemius veins when visible. The superficial great saphenous vein was also interrogated. Spectral Doppler was utilized to evaluate flow at rest and with distal augmentation maneuvers in the common femoral, femoral and popliteal veins. COMPARISON:  RIGHT lower extremity venous ultrasound April 26, 2012 FINDINGS: Contralateral Common Femoral Vein: Respiratory phasicity is normal and symmetric with the symptomatic side. No evidence of thrombus. Normal compressibility. Common Femoral Vein: No evidence of thrombus. Normal compressibility, respiratory phasicity and response to augmentation. Saphenofemoral Junction: No evidence of thrombus. Normal compressibility and flow on color Doppler imaging. Profunda Femoral Vein: No evidence of thrombus. Normal compressibility and flow on color Doppler imaging. Femoral Vein: No evidence of thrombus. Normal compressibility, respiratory phasicity and response to augmentation. Popliteal Vein: No evidence of thrombus. Normal compressibility, respiratory phasicity and response to augmentation. Calf Veins: No evidence of thrombus. Normal compressibility and flow on color Doppler imaging. Other Findings:  Mild interstitial edema. IMPRESSION: 1. No RIGHT lower extremity deep vein thrombosis. 2. Mild interstitial edema.  Electronically Signed   By: Awilda Metro M.D.   On: 06/01/2018 22:53      Allergies as of 06/03/2018      Reactions   Demerol [meperidine] Anaphylaxis   Arithmin [antazoline]    Erythromycin       Medication List    STOP taking these medications   sulfamethoxazole-trimethoprim 800-160 MG tablet Commonly known as:  BACTRIM DS,SEPTRA DS     TAKE these medications   acetaminophen 500 MG tablet Commonly known as:  TYLENOL Take 1,000 mg by mouth every 6 (six) hours as needed for mild pain.   amoxicillin-clavulanate 875-125 MG tablet Commonly known as:  AUGMENTIN Take 1 tablet by mouth 2 (two) times daily.   carvedilol 12.5 MG tablet Commonly known as:  COREG Take 1 tablet by mouth  2 (two) times daily.   cholecalciferol 1000 units tablet Commonly known as:  VITAMIN D Take 1,000 Units by mouth daily.   collagenase ointment Commonly known as:  SANTYL Apply 1 application topically 2 (two) times daily.   ferrous sulfate 325 (65 FE) MG EC tablet Take 325 mg by mouth daily with breakfast.   gabapentin 300 MG capsule Commonly known as:  NEURONTIN Take 2 capsules by mouth 3 (three) times daily.   insulin aspart 100 UNIT/ML injection Commonly known as:  novoLOG Inject 50 Units into the skin See admin instructions. Per sliding scale   insulin glargine 100 UNIT/ML injection Commonly known as:  LANTUS Inject 10 Units into the skin at bedtime.   isosorbide mononitrate 30 MG 24 hr tablet Commonly known as:  IMDUR Take 1 tablet by mouth 2 (two) times daily.   morphine 30 MG 12 hr tablet Commonly known as:  MS CONTIN Take 30 mg by mouth every 12 (twelve) hours.   multivitamin with minerals tablet Take 1 tablet by mouth daily.   oxycodone 5 MG capsule Commonly known as:  OXY-IR Take 1 capsule (5 mg total) by mouth every 4 (four) hours as needed. What changed:    how much to take  when to take this   pantoprazole 40 MG tablet Commonly known as:  PROTONIX Take  40 mg by mouth 2 (two) times daily.   pravastatin 20 MG tablet Commonly known as:  PRAVACHOL Take 1 tablet by mouth at bedtime.   PROAIR HFA 108 (90 Base) MCG/ACT inhaler Generic drug:  albuterol Inhale 2 puffs into the lungs every 4 (four) hours as needed for wheezing or shortness of breath.   SEROQUEL 400 MG tablet Generic drug:  QUEtiapine Take 1 tablet by mouth at bedtime.   TEGRETOL 200 MG tablet Generic drug:  carbamazepine Take 1 tablet by mouth 2 (two) times daily.          Management plans discussed with the patient and he is in agreement. Stable for discharge   Patient should follow up with pcp  CODE STATUS:     Code Status Orders  (From admission, onward)         Start     Ordered   06/02/18 0116  Full code  Continuous     06/02/18 0115        Code Status History    Date Active Date Inactive Code Status Order ID Comments User Context   05/08/2015 0817 05/08/2015 1948 Full Code [id number]  Crissie Figureseddy, Edavally N, MD Inpatient      TOTAL TIME TAKING CARE OF THIS PATIENT: 38 minutes.    Note: This dictation was prepared with Dragon dictation along with smaller phrase technology. Any transcriptional errors that result from this process are unintentional.  Vinson Tietze M.D on 06/03/2018 at 8:00 AM  Between 7am to 6pm - Pager - [phone] After 6pm go to [email or web address] - Social research officer, governmentpassword [email or web address]  [facility]  Office  [phone]  CC: Primary care physician; [facility]

## 2018-06-03 NOTE — Progress Notes (Signed)
Patient is being discharged to home with wife. DC & RX instructions given and patient acknowledged understanding. IV removed. Wife will transport patient home.

## 2018-06-03 NOTE — Progress Notes (Signed)
[facility]  ROUNDING NOTE   Subjective:   Na 130  Productive cough this morning.   Objective:  Vital signs in last 24 hours:  Temp:  [97.8 F (36.6 C)-99.3 F (37.4 C)] 99.2 F (37.3 C) (09/20 0731) Pulse Rate:  [71-98] 90 (09/20 0731) Resp:  [18] 18 (09/20 0017) BP: (92-124)/(54-106) 108/73 (09/20 0731) SpO2:  [92 %-99 %] 98 % (09/20 0731)  Weight change:  Filed Weights   06/01/18 2051 06/02/18 0123  Weight: 74.8 kg 78.6 kg    Intake/Output: I/O last 3 completed shifts: In: 3392 [P.O.:600; I.V.:544.3; IV Piggyback:2247.7] Out: 4280 [Urine:4280]   Intake/Output this shift:  No intake/output data recorded.  Physical Exam: General: NAD, laying in bed  Head: Normocephalic, atraumatic. Moist oral mucosal membranes  Eyes: Anicteric, PERRL  Neck: Supple, trachea midline  Lungs:  Clear to auscultation  Heart: Regular rate and rhythm  Abdomen:  Soft, nontender  Extremities:  no peripheral edema. Left AKA  Neurologic: Nonfocal, moving all four extremities  Skin: No lesions        Basic Metabolic Panel: Recent Labs  Lab 06/01/18 2112 06/02/18 0408 06/02/18 1215 06/03/18 0505  NA 110* 123* 129* 130*  K 5.5* 4.5  --  4.1  CL 77* 88*  --  97*  CO2 22 27  --  26  GLUCOSE 117* 84  --  100*  BUN 7 6  --  6  CREATININE 0.78 0.78  --  0.82  CALCIUM 8.2* 7.9*  --  7.9*    Liver Function Tests: Recent Labs  Lab 06/01/18 2112  AST 69*  ALT 22  ALKPHOS 257*  BILITOT 1.3*  PROT 6.9  ALBUMIN 2.7*   No results for input(s): LIPASE, AMYLASE in the last 168 hours. No results for input(s): AMMONIA in the last 168 hours.  CBC: Recent Labs  Lab 06/01/18 2112 06/02/18 0408  WBC 16.7* 19.0*  NEUTROABS 15.4*  --   HGB 12.5* 10.3*  HCT 35.4* 29.2*  MCV 102.8* 102.7*  PLT 338 335    Cardiac Enzymes: No results for input(s): CKTOTAL, CKMB, CKMBINDEX, TROPONINI in the last 168 hours.  BNP: Invalid input(s): POCBNP  CBG: Recent Labs  Lab  06/02/18 0757 06/02/18 1156 06/02/18 1618 06/02/18 2105 06/03/18 0733  GLUCAP 82 107* 119* 110* 75    Microbiology: Results for orders placed or performed during the hospital encounter of 06/01/18  Blood culture (routine x 2)     Status: None (Preliminary result)   Collection Time: 06/01/18 11:23 PM  Result Value Ref Range Status   Specimen Description BLOOD RIGHT ASSIST CONTROL  Final   Special Requests   Final    BOTTLES DRAWN AEROBIC AND ANAEROBIC Blood Culture results may not be optimal due to an excessive volume of blood received in culture bottles   Culture   Final    NO GROWTH 1 DAY Performed at [facility], [address], [place], [place] [address]    Report Status PENDING  Incomplete  Blood culture (routine x 2)     Status: None (Preliminary result)   Collection Time: 06/02/18  3:34 AM  Result Value Ref Range Status   Specimen Description BLOOD LEFT ARM  Final   Special Requests   Final    BOTTLES DRAWN AEROBIC AND ANAEROBIC Blood Culture adequate volume   Culture   Final    NO GROWTH 1 DAY Performed at [facility], [address], [place], [place] [address]  Report Status PENDING  Incomplete    Coagulation Studies: No results for input(s): LABPROT, INR in the last 72 hours.  Urinalysis: No results for input(s): COLORURINE, LABSPEC, PHURINE, GLUCOSEU, HGBUR, BILIRUBINUR, KETONESUR, PROTEINUR, UROBILINOGEN, NITRITE, LEUKOCYTESUR in the last 72 hours.  Invalid input(s): APPERANCEUR    Imaging: Dg Ribs Unilateral W/chest Left  Result Date: 06/01/2018 CLINICAL DATA:  Fall with left-sided rib pain EXAM: LEFT RIBS AND CHEST - 3+ VIEW COMPARISON:  08/19/2015 FINDINGS: Single view chest demonstrates diffuse bilateral interstitial opacity with patchy and slightly nodular appearing more confluent airspace disease in the right thorax. Normal heart size. Aortic atherosclerosis. Left rib series demonstrates acute mildly displaced left eighth lateral  rib fracture. IMPRESSION: 1. Negative for pneumothorax or pleural effusion. Acute mildly displaced left eighth rib fracture 2. Bilateral interstitial opacity with patchy confluent airspace disease in the right thorax suspicious for pneumonia. Electronically Signed   By: Jasmine Pang M.D.   On: 06/01/2018 21:26   US Venous Img Lower Unilateral Right  Result Date: 06/01/2018 CLINICAL DATA:  RIGHT leg swelling for 2 days. History of thrombophlebitis. EXAM: RIGHT  LOWER EXTREMITY VENOUS DOPPLER ULTRASOUND TECHNIQUE: Gray-scale sonography with graded compression, as well as color Doppler and duplex ultrasound were performed to evaluate the lower extremity deep venous systems from the level of the common femoral vein and including the common femoral, femoral, profunda femoral, popliteal and calf veins including the posterior tibial, peroneal and gastrocnemius veins when visible. The superficial great saphenous vein was also interrogated. Spectral Doppler was utilized to evaluate flow at rest and with distal augmentation maneuvers in the common femoral, femoral and popliteal veins. COMPARISON:  RIGHT lower extremity venous ultrasound April 26, 2012 FINDINGS: Contralateral Common Femoral Vein: Respiratory phasicity is normal and symmetric with the symptomatic side. No evidence of thrombus. Normal compressibility. Common Femoral Vein: No evidence of thrombus. Normal compressibility, respiratory phasicity and response to augmentation. Saphenofemoral Junction: No evidence of thrombus. Normal compressibility and flow on color Doppler imaging. Profunda Femoral Vein: No evidence of thrombus. Normal compressibility and flow on color Doppler imaging. Femoral Vein: No evidence of thrombus. Normal compressibility, respiratory phasicity and response to augmentation. Popliteal Vein: No evidence of thrombus. Normal compressibility, respiratory phasicity and response to augmentation. Calf Veins: No evidence of thrombus. Normal  compressibility and flow on color Doppler imaging. Other Findings:  Mild interstitial edema. IMPRESSION: 1. No RIGHT lower extremity deep vein thrombosis. 2. Mild interstitial edema. Electronically Signed   By: Awilda Metro M.D.   On: 06/01/2018 22:53     Medications:   . cefTRIAXone (ROCEPHIN)  IV Stopped (06/03/18 0327)  . vancomycin 1,000 mg (06/03/18 0200)   . carbamazepine  200 mg Oral BID  . carvedilol  12.5 mg Oral BID  . enoxaparin (LOVENOX) injection  40 mg Subcutaneous Q24H  . insulin aspart  0-5 Units Subcutaneous QHS  . insulin aspart  0-9 Units Subcutaneous TID WC  . isosorbide mononitrate  30 mg Oral Daily  . morphine  30 mg Oral Q12H  . pantoprazole  40 mg Oral BID  . pravastatin  20 mg Oral QHS  . QUEtiapine  200 mg Oral QHS   acetaminophen, ondansetron **OR** ondansetron (ZOFRAN) IV, oxyCODONE  Assessment/ Plan:  [name] is a 47 y.o. white male with coronary artery disease, hypertension, diabetes mellitus type II, peptic ulcer disease, COPD, who was admitted to [facility] on 06/01/2018 for Hyponatremia    1. Hyponatremia: acute on chronic. History of hyponatremia. However most likely  with SIADH and hypovolemia. At baseline.  - Improved with IV fluids.    LOS: 2 Sibbie Flammia 9/20/20199:45 AM

## 2018-06-07 LAB — CULTURE, BLOOD (ROUTINE X 2)
Culture: NO GROWTH
Culture: NO GROWTH
SPECIAL REQUESTS: ADEQUATE

## 2018-07-01 DIAGNOSIS — W050XXA Fall from non-moving wheelchair, initial encounter: Secondary | ICD-10-CM | POA: Diagnosis not present

## 2018-07-01 DIAGNOSIS — G8929 Other chronic pain: Secondary | ICD-10-CM | POA: Diagnosis not present

## 2018-07-01 DIAGNOSIS — Y929 Unspecified place or not applicable: Secondary | ICD-10-CM | POA: Diagnosis not present

## 2018-07-01 DIAGNOSIS — W19XXXA Unspecified fall, initial encounter: Secondary | ICD-10-CM | POA: Diagnosis not present

## 2018-07-01 DIAGNOSIS — S72012A Unspecified intracapsular fracture of left femur, initial encounter for closed fracture: Secondary | ICD-10-CM | POA: Diagnosis not present

## 2018-07-01 DIAGNOSIS — R69 Illness, unspecified: Secondary | ICD-10-CM | POA: Diagnosis not present

## 2018-07-01 DIAGNOSIS — I252 Old myocardial infarction: Secondary | ICD-10-CM | POA: Diagnosis not present

## 2018-07-01 DIAGNOSIS — F329 Major depressive disorder, single episode, unspecified: Secondary | ICD-10-CM | POA: Diagnosis not present

## 2018-07-01 DIAGNOSIS — I2510 Atherosclerotic heart disease of native coronary artery without angina pectoris: Secondary | ICD-10-CM | POA: Diagnosis not present

## 2018-07-01 DIAGNOSIS — Z86718 Personal history of other venous thrombosis and embolism: Secondary | ICD-10-CM | POA: Diagnosis not present

## 2018-07-01 DIAGNOSIS — Z89612 Acquired absence of left leg above knee: Secondary | ICD-10-CM | POA: Diagnosis not present

## 2018-07-01 DIAGNOSIS — Z23 Encounter for immunization: Secondary | ICD-10-CM | POA: Diagnosis not present

## 2018-07-01 DIAGNOSIS — E119 Type 2 diabetes mellitus without complications: Secondary | ICD-10-CM | POA: Diagnosis not present

## 2018-07-01 DIAGNOSIS — S72002A Fracture of unspecified part of neck of left femur, initial encounter for closed fracture: Secondary | ICD-10-CM | POA: Diagnosis not present

## 2018-07-01 DIAGNOSIS — Z993 Dependence on wheelchair: Secondary | ICD-10-CM | POA: Diagnosis not present

## 2018-07-01 DIAGNOSIS — M25552 Pain in left hip: Secondary | ICD-10-CM | POA: Diagnosis not present

## 2018-07-01 DIAGNOSIS — F603 Borderline personality disorder: Secondary | ICD-10-CM | POA: Diagnosis not present

## 2018-07-01 DIAGNOSIS — J449 Chronic obstructive pulmonary disease, unspecified: Secondary | ICD-10-CM | POA: Diagnosis not present

## 2018-07-02 DIAGNOSIS — W19XXXA Unspecified fall, initial encounter: Secondary | ICD-10-CM | POA: Diagnosis not present

## 2018-07-02 DIAGNOSIS — Z7982 Long term (current) use of aspirin: Secondary | ICD-10-CM | POA: Diagnosis not present

## 2018-07-02 DIAGNOSIS — E119 Type 2 diabetes mellitus without complications: Secondary | ICD-10-CM | POA: Diagnosis not present

## 2018-07-02 DIAGNOSIS — J449 Chronic obstructive pulmonary disease, unspecified: Secondary | ICD-10-CM | POA: Diagnosis not present

## 2018-07-02 DIAGNOSIS — S72012A Unspecified intracapsular fracture of left femur, initial encounter for closed fracture: Secondary | ICD-10-CM | POA: Diagnosis not present

## 2018-07-02 DIAGNOSIS — N189 Chronic kidney disease, unspecified: Secondary | ICD-10-CM | POA: Diagnosis not present

## 2018-07-02 DIAGNOSIS — I129 Hypertensive chronic kidney disease with stage 1 through stage 4 chronic kidney disease, or unspecified chronic kidney disease: Secondary | ICD-10-CM | POA: Diagnosis not present

## 2018-07-02 DIAGNOSIS — R69 Illness, unspecified: Secondary | ICD-10-CM | POA: Diagnosis not present

## 2018-07-02 DIAGNOSIS — I252 Old myocardial infarction: Secondary | ICD-10-CM | POA: Diagnosis not present

## 2018-07-02 DIAGNOSIS — G8929 Other chronic pain: Secondary | ICD-10-CM | POA: Diagnosis not present

## 2018-07-02 DIAGNOSIS — Z720 Tobacco use: Secondary | ICD-10-CM | POA: Diagnosis not present

## 2018-07-02 DIAGNOSIS — S72002A Fracture of unspecified part of neck of left femur, initial encounter for closed fracture: Secondary | ICD-10-CM | POA: Diagnosis not present

## 2018-07-02 DIAGNOSIS — Z86718 Personal history of other venous thrombosis and embolism: Secondary | ICD-10-CM | POA: Diagnosis not present

## 2018-07-02 DIAGNOSIS — S72052A Unspecified fracture of head of left femur, initial encounter for closed fracture: Secondary | ICD-10-CM | POA: Diagnosis not present

## 2018-07-03 DIAGNOSIS — J449 Chronic obstructive pulmonary disease, unspecified: Secondary | ICD-10-CM | POA: Diagnosis not present

## 2018-07-03 DIAGNOSIS — E119 Type 2 diabetes mellitus without complications: Secondary | ICD-10-CM | POA: Diagnosis not present

## 2018-07-03 DIAGNOSIS — S72002A Fracture of unspecified part of neck of left femur, initial encounter for closed fracture: Secondary | ICD-10-CM | POA: Diagnosis not present

## 2018-07-03 DIAGNOSIS — Z86718 Personal history of other venous thrombosis and embolism: Secondary | ICD-10-CM | POA: Diagnosis not present

## 2018-07-03 DIAGNOSIS — G8929 Other chronic pain: Secondary | ICD-10-CM | POA: Diagnosis not present

## 2018-12-01 ENCOUNTER — Ambulatory Visit: Payer: Self-pay | Admitting: *Deleted

## 2018-12-01 ENCOUNTER — Telehealth: Payer: Medicare HMO

## 2018-12-01 NOTE — Chronic Care Management (AMB) (Addendum)
  Chronic Care Management   Note  12/01/2018 Name: [name] MRN: [id number] DOB: 1971-08-28  Outreach call placed to this patient after receiving a CCM referral from [employer]Referral received for care coordination and chronic care management for [name] who is no longer a primary care patient at [facility]. [name] is now a patient at [facility]. I attempted to reach out to [name] today at both numbers listed for him. A designated party release is not noted in the chart. Therefore, no outreach was made to any individual other than [name].   Will make [facility]  practice manager aware.    Follow up plan:  Plan: No further follow up needed. Patient being followed at [facility] medical center.     Ma Rings Mclean Moya RN, BSN Nurse Case Education officer, community Family Practice/[facility] Care Management  [phone]) Business [employer]

## 2019-03-21 ENCOUNTER — Telehealth: Payer: Self-pay | Admitting: *Deleted

## 2019-03-21 DIAGNOSIS — Z20822 Contact with and (suspected) exposure to covid-19: Secondary | ICD-10-CM

## 2019-03-21 NOTE — Telephone Encounter (Signed)
Pt scheduled for covid testing tomorrow at [facility].   Requested by [facility].  Pt with possible exposure, symptoms.  Testing process reviewed, wear mask, stay in car, verbalizes understanding.

## 2019-03-22 ENCOUNTER — Other Ambulatory Visit: Payer: Self-pay

## 2019-03-22 DIAGNOSIS — Z20822 Contact with and (suspected) exposure to covid-19: Secondary | ICD-10-CM

## 2019-03-26 LAB — NOVEL CORONAVIRUS, NAA: SARS-CoV-2, NAA: DETECTED — AB

## 2020-02-25 ENCOUNTER — Other Ambulatory Visit: Payer: Self-pay

## 2020-02-25 ENCOUNTER — Emergency Department: Payer: Medicare HMO

## 2020-02-25 ENCOUNTER — Inpatient Hospital Stay
Admission: AD | Admit: 2020-02-25 | Discharge: 2020-02-28 | DRG: 190 | Disposition: A | Discharge status: Home Health | Payer: Medicare HMO | Attending: Internal Medicine | Admitting: Internal Medicine

## 2020-02-25 DIAGNOSIS — Z881 Allergy status to other antibiotic agents status: Secondary | ICD-10-CM

## 2020-02-25 DIAGNOSIS — K219 Gastro-esophageal reflux disease without esophagitis: Secondary | ICD-10-CM | POA: Diagnosis present

## 2020-02-25 DIAGNOSIS — E1151 Type 2 diabetes mellitus with diabetic peripheral angiopathy without gangrene: Secondary | ICD-10-CM | POA: Diagnosis not present

## 2020-02-25 DIAGNOSIS — F17210 Nicotine dependence, cigarettes, uncomplicated: Secondary | ICD-10-CM | POA: Diagnosis present

## 2020-02-25 DIAGNOSIS — R0602 Shortness of breath: Secondary | ICD-10-CM | POA: Diagnosis not present

## 2020-02-25 DIAGNOSIS — R069 Unspecified abnormalities of breathing: Secondary | ICD-10-CM | POA: Diagnosis not present

## 2020-02-25 DIAGNOSIS — Z20822 Contact with and (suspected) exposure to covid-19: Secondary | ICD-10-CM | POA: Diagnosis not present

## 2020-02-25 DIAGNOSIS — G8929 Other chronic pain: Secondary | ICD-10-CM | POA: Diagnosis present

## 2020-02-25 DIAGNOSIS — Z79899 Other long term (current) drug therapy: Secondary | ICD-10-CM | POA: Diagnosis not present

## 2020-02-25 DIAGNOSIS — J441 Chronic obstructive pulmonary disease with (acute) exacerbation: Principal | ICD-10-CM | POA: Diagnosis present

## 2020-02-25 DIAGNOSIS — J9622 Acute and chronic respiratory failure with hypercapnia: Secondary | ICD-10-CM | POA: Diagnosis not present

## 2020-02-25 DIAGNOSIS — J80 Acute respiratory distress syndrome: Secondary | ICD-10-CM | POA: Diagnosis not present

## 2020-02-25 DIAGNOSIS — S78112A Complete traumatic amputation at level between left hip and knee, initial encounter: Secondary | ICD-10-CM

## 2020-02-25 DIAGNOSIS — Z888 Allergy status to other drugs, medicaments and biological substances status: Secondary | ICD-10-CM | POA: Diagnosis not present

## 2020-02-25 DIAGNOSIS — I10 Essential (primary) hypertension: Secondary | ICD-10-CM | POA: Diagnosis not present

## 2020-02-25 DIAGNOSIS — J984 Other disorders of lung: Secondary | ICD-10-CM | POA: Diagnosis not present

## 2020-02-25 DIAGNOSIS — J9601 Acute respiratory failure with hypoxia: Secondary | ICD-10-CM | POA: Diagnosis present

## 2020-02-25 DIAGNOSIS — Z89612 Acquired absence of left leg above knee: Secondary | ICD-10-CM

## 2020-02-25 DIAGNOSIS — Z885 Allergy status to narcotic agent status: Secondary | ICD-10-CM

## 2020-02-25 DIAGNOSIS — D696 Thrombocytopenia, unspecified: Secondary | ICD-10-CM | POA: Diagnosis not present

## 2020-02-25 DIAGNOSIS — Z794 Long term (current) use of insulin: Secondary | ICD-10-CM

## 2020-02-25 DIAGNOSIS — E119 Type 2 diabetes mellitus without complications: Secondary | ICD-10-CM

## 2020-02-25 DIAGNOSIS — I252 Old myocardial infarction: Secondary | ICD-10-CM | POA: Diagnosis not present

## 2020-02-25 DIAGNOSIS — J9602 Acute respiratory failure with hypercapnia: Secondary | ICD-10-CM | POA: Diagnosis not present

## 2020-02-25 DIAGNOSIS — E872 Acidosis: Secondary | ICD-10-CM | POA: Diagnosis not present

## 2020-02-25 DIAGNOSIS — F99 Mental disorder, not otherwise specified: Secondary | ICD-10-CM | POA: Diagnosis present

## 2020-02-25 DIAGNOSIS — Z79891 Long term (current) use of opiate analgesic: Secondary | ICD-10-CM

## 2020-02-25 DIAGNOSIS — R231 Pallor: Secondary | ICD-10-CM | POA: Diagnosis not present

## 2020-02-25 DIAGNOSIS — M3589 Other specified systemic involvement of connective tissue: Secondary | ICD-10-CM | POA: Diagnosis present

## 2020-02-25 DIAGNOSIS — J9621 Acute and chronic respiratory failure with hypoxia: Secondary | ICD-10-CM | POA: Diagnosis present

## 2020-02-25 DIAGNOSIS — R001 Bradycardia, unspecified: Secondary | ICD-10-CM | POA: Diagnosis not present

## 2020-02-25 LAB — CBC WITH DIFFERENTIAL/PLATELET
Abs Immature Granulocytes: 0.01 10*3/uL (ref 0.00–0.07)
Basophils Absolute: 0.1 10*3/uL (ref 0.0–0.1)
Basophils Relative: 1 %
Eosinophils Absolute: 0.9 10*3/uL — ABNORMAL HIGH (ref 0.0–0.5)
Eosinophils Relative: 13 %
HCT: 39 % (ref 39.0–52.0)
Hemoglobin: 13.2 g/dL (ref 13.0–17.0)
Immature Granulocytes: 0 %
Lymphocytes Relative: 27 %
Lymphs Abs: 1.9 10*3/uL (ref 0.7–4.0)
MCH: 34 pg (ref 26.0–34.0)
MCHC: 33.8 g/dL (ref 30.0–36.0)
MCV: 100.5 fL — ABNORMAL HIGH (ref 80.0–100.0)
Monocytes Absolute: 0.9 10*3/uL (ref 0.1–1.0)
Monocytes Relative: 13 %
Neutro Abs: 3.2 10*3/uL (ref 1.7–7.7)
Neutrophils Relative %: 46 %
Platelets: 272 10*3/uL (ref 150–400)
RBC: 3.88 MIL/uL — ABNORMAL LOW (ref 4.22–5.81)
RDW: 14.1 % (ref 11.5–15.5)
WBC: 6.9 10*3/uL (ref 4.0–10.5)
nRBC: 0 % (ref 0.0–0.2)

## 2020-02-25 LAB — COMPREHENSIVE METABOLIC PANEL
ALT: 8 U/L (ref 0–44)
AST: 16 U/L (ref 15–41)
Albumin: 2.9 g/dL — ABNORMAL LOW (ref 3.5–5.0)
Alkaline Phosphatase: 178 U/L — ABNORMAL HIGH (ref 38–126)
Anion gap: 9 (ref 5–15)
BUN: 5 mg/dL — ABNORMAL LOW (ref 6–20)
CO2: 28 mmol/L (ref 22–32)
Calcium: 8.6 mg/dL — ABNORMAL LOW (ref 8.9–10.3)
Chloride: 93 mmol/L — ABNORMAL LOW (ref 98–111)
Creatinine, Ser: 0.87 mg/dL (ref 0.61–1.24)
GFR calc Af Amer: >60 mL/min (ref >60)
GFR calc non Af Amer: >60 mL/min (ref >60)
Glucose, Bld: 109 mg/dL — ABNORMAL HIGH (ref 70–99)
Potassium: 4.7 mmol/L (ref 3.5–5.1)
Sodium: 130 mmol/L — ABNORMAL LOW (ref 135–145)
Total Bilirubin: 0.5 mg/dL (ref 0.3–1.2)
Total Protein: 6.7 g/dL (ref 6.5–8.1)

## 2020-02-25 LAB — TROPONIN I (HIGH SENSITIVITY): Troponin I (High Sensitivity): 5 ng/L (ref <18)

## 2020-02-25 LAB — GLUCOSE, CAPILLARY: Glucose-Capillary: 166 mg/dL — ABNORMAL HIGH (ref 70–99)

## 2020-02-25 LAB — BLOOD GAS, ARTERIAL
Acid-Base Excess: 2.2 mmol/L — ABNORMAL HIGH (ref 0.0–2.0)
Bicarbonate: 30.5 mmol/L — ABNORMAL HIGH (ref 20.0–28.0)
FIO2: 0.36
O2 Saturation: 91.7 %
Patient temperature: 37
pCO2 arterial: 65 mmHg — ABNORMAL HIGH (ref 32.0–48.0)
pH, Arterial: 7.28 — ABNORMAL LOW (ref 7.350–7.450)
pO2, Arterial: 71 mmHg — ABNORMAL LOW (ref 83.0–108.0)

## 2020-02-25 LAB — LACTIC ACID, PLASMA: Lactic Acid, Venous: 0.8 mmol/L (ref 0.5–1.9)

## 2020-02-25 LAB — SARS CORONAVIRUS 2 BY RT PCR (HOSPITAL ORDER, PERFORMED IN ~~LOC~~ HOSPITAL LAB): SARS Coronavirus 2: NEGATIVE

## 2020-02-25 MED ORDER — PREDNISONE 20 MG PO TABS
40.0000 mg | ORAL_TABLET | Freq: Every day | ORAL | Status: DC
  Administered 2020-02-27 – 2020-02-28 (×2): 40 mg via ORAL
  Filled 2020-02-25 (×2): qty 2

## 2020-02-25 MED ORDER — IPRATROPIUM-ALBUTEROL 0.5-2.5 (3) MG/3ML IN SOLN
3.0000 mL | Freq: Four times a day (QID) | RESPIRATORY_TRACT | Status: DC
  Administered 2020-02-25 – 2020-02-27 (×6): 3 mL via RESPIRATORY_TRACT
  Filled 2020-02-25 (×6): qty 3

## 2020-02-25 MED ORDER — ALBUTEROL SULFATE (2.5 MG/3ML) 0.083% IN NEBU
5.0000 mg | INHALATION_SOLUTION | Freq: Once | RESPIRATORY_TRACT | Status: AC
  Administered 2020-02-25: 5 mg via RESPIRATORY_TRACT
  Filled 2020-02-25: qty 6

## 2020-02-25 MED ORDER — METHYLPREDNISOLONE SODIUM SUCC 40 MG IJ SOLR
40.0000 mg | Freq: Four times a day (QID) | INTRAMUSCULAR | Status: AC
  Administered 2020-02-25 – 2020-02-26 (×4): 40 mg via INTRAVENOUS
  Filled 2020-02-25 (×4): qty 1

## 2020-02-25 MED ORDER — INSULIN ASPART 100 UNIT/ML ~~LOC~~ SOLN
0.0000 [IU] | Freq: Three times a day (TID) | SUBCUTANEOUS | Status: DC
  Administered 2020-02-26: 18:00:00 2 [IU] via SUBCUTANEOUS
  Administered 2020-02-26 – 2020-02-27 (×3): 3 [IU] via SUBCUTANEOUS
  Filled 2020-02-25 (×4): qty 1

## 2020-02-25 MED ORDER — NALOXONE HCL 2 MG/2ML IJ SOSY
PREFILLED_SYRINGE | INTRAMUSCULAR | Status: AC
  Filled 2020-02-25: qty 2

## 2020-02-25 MED ORDER — SODIUM CHLORIDE 0.9 % IV SOLN
1.0000 g | INTRAVENOUS | Status: DC
  Administered 2020-02-25 – 2020-02-27 (×3): 1 g via INTRAVENOUS
  Filled 2020-02-25 (×2): qty 1
  Filled 2020-02-25 (×2): qty 10

## 2020-02-25 MED ORDER — IPRATROPIUM-ALBUTEROL 0.5-2.5 (3) MG/3ML IN SOLN
6.0000 mL | Freq: Once | RESPIRATORY_TRACT | Status: AC
  Administered 2020-02-25: 6 mL via RESPIRATORY_TRACT
  Filled 2020-02-25: qty 3

## 2020-02-25 MED ORDER — ALBUTEROL SULFATE (2.5 MG/3ML) 0.083% IN NEBU
2.5000 mg | INHALATION_SOLUTION | RESPIRATORY_TRACT | Status: DC | PRN
  Administered 2020-02-27 – 2020-02-28 (×2): 2.5 mg via RESPIRATORY_TRACT
  Filled 2020-02-25 (×2): qty 3

## 2020-02-25 MED ORDER — MORPHINE SULFATE ER 30 MG PO TBCR
30.0000 mg | EXTENDED_RELEASE_TABLET | Freq: Two times a day (BID) | ORAL | Status: DC
  Administered 2020-02-26 – 2020-02-28 (×5): 30 mg via ORAL
  Filled 2020-02-25 (×5): qty 1

## 2020-02-25 MED ORDER — METHYLPREDNISOLONE SODIUM SUCC 125 MG IJ SOLR
125.0000 mg | Freq: Once | INTRAMUSCULAR | Status: AC
  Administered 2020-02-25: 125 mg via INTRAVENOUS
  Filled 2020-02-25: qty 2

## 2020-02-25 MED ORDER — MAGNESIUM SULFATE 2 GM/50ML IV SOLN
2.0000 g | Freq: Once | INTRAVENOUS | Status: AC
  Administered 2020-02-25: 2 g via INTRAVENOUS
  Filled 2020-02-25: qty 50

## 2020-02-25 MED ORDER — OXYCODONE HCL 5 MG PO TABS
5.0000 mg | ORAL_TABLET | ORAL | Status: DC | PRN
  Administered 2020-02-26 – 2020-02-27 (×3): 5 mg via ORAL
  Filled 2020-02-25 (×3): qty 1

## 2020-02-25 MED ORDER — INSULIN ASPART 100 UNIT/ML ~~LOC~~ SOLN
0.0000 [IU] | Freq: Every day | SUBCUTANEOUS | Status: DC
  Filled 2020-02-25: qty 1

## 2020-02-25 MED ORDER — ENOXAPARIN SODIUM 40 MG/0.4ML ~~LOC~~ SOLN
40.0000 mg | SUBCUTANEOUS | Status: DC
  Administered 2020-02-26: 40 mg via SUBCUTANEOUS
  Filled 2020-02-25: qty 0.4

## 2020-02-25 NOTE — H&P (Signed)
History and Physical    [name]:[id number] DOB: 1970-09-20 DOA: 02/25/2020  [facility]: [facility]   Patient coming from: Home I have personally briefly reviewed patient's old medical records in [place]  Chief Complaint: Shortness of breath  HPI: [name] is a 49 y.o. male with medical history significant for diabetes, hypertension, peripheral vascular disease status post left AKA, questionable history of past MI, followed at the [facility] who presents to the emergency room with shortness of breath and wheezing not responding to home bronchodilator therapy.  He was brought in by EMS and O2 sats reportedly in the mid 80s on their arrival.  Patient has a congested cough but denies fever or chills.  Denies chest pain he is up-to-date with his Covid vaccine.  He received 2 duo nebs in the ER ED Course:.  On arrival O2 sat was 92% on 2 L started by EMS.  Vitals otherwise unremarkable.  Blood work mostly unremarkable except for sodium of 130.  Chest x-ray showed no acute disease.  Patient received 3 additional DuoNeb treatments along with IV Solu-Medrol and IV magnesium in the emergency room but continued to have increased work of breathing and hospitalist consulted for admission ABG pending at time of decision to admit.  Review of Systems: As per HPI otherwise 10 point review of systems negative.    Past Medical History:  Diagnosis Date  . Asthma   . Collagen vascular disease ([place])   . COPD (chronic obstructive pulmonary disease) ([place])   . Diabetes mellitus without complication ([place])   . Gastric ulcer   . Hypertension   . Myocardial infarction ([place])   . Renal disorder   . Thrombocytopenia ([place])     Past Surgical History:  Procedure Laterality Date  . CHOLECYSTECTOMY    . FOOT SURGERY Left   . left below knee amputation       reports that he has been smoking cigarettes. He has been smoking about 1.00 pack per day. He has never used smokeless  tobacco. He reports that he does not drink alcohol and does not use drugs.  Allergies  Allergen Reactions  . Demerol [Meperidine] Anaphylaxis  . Arithmin [Antazoline]   . Erythromycin   . Zosyn [Piperacillin Sod-Tazobactam So]     Family History  Adopted: Yes     Prior to Admission medications   Medication Sig Start Date End Date Taking? Authorizing Provider  acetaminophen (TYLENOL) 500 MG tablet Take 1,000 mg by mouth every 6 (six) hours as needed for mild pain.  05/09/12   [provider]  albuterol (PROAIR HFA) 108 (90 BASE) MCG/ACT inhaler Inhale 2 puffs into the lungs every 4 (four) hours as needed for wheezing or shortness of breath.  08/20/14   [provider]  amoxicillin-clavulanate (AUGMENTIN) 875-125 MG tablet Take 1 tablet by mouth 2 (two) times daily. 06/03/18   Bettey Costa, MD  carbamazepine (TEGRETOL) 200 MG tablet Take 1 tablet by mouth 2 (two) times daily.     [provider]  carvedilol (COREG) 12.5 MG tablet Take 1 tablet by mouth 2 (two) times daily.    [provider]  cholecalciferol (VITAMIN D) 1000 units tablet Take 1,000 Units by mouth daily.    [provider]  collagenase (SANTYL) ointment Apply 1 application topically 2 (two) times daily. 02/23/18   Triplett, Johnette Abraham B, FNP  ferrous sulfate 325 (65 FE) MG EC tablet Take 325 mg by mouth daily with breakfast.  [provider]  gabapentin (NEURONTIN) 300 MG capsule Take 2 capsules by mouth 3 (three) times daily. 03/09/15 03/08/16  [provider]  insulin aspart (NOVOLOG) 100 UNIT/ML injection Inject 50 Units into the skin See admin instructions. Per sliding scale    [provider]  insulin glargine (LANTUS) 100 UNIT/ML injection Inject 10 Units into the skin at bedtime.    [provider]  isosorbide mononitrate (IMDUR) 30 MG 24 hr tablet Take 1 tablet by mouth 2 (two) times daily.    [provider]  morphine (MS CONTIN) 30 MG  12 hr tablet Take 30 mg by mouth every 12 (twelve) hours.    [provider]  Multiple Vitamins-Minerals (MULTIVITAMIN WITH MINERALS) tablet Take 1 tablet by mouth daily.    [provider]  oxycodone (OXY-IR) 5 MG capsule Take 1 capsule (5 mg total) by mouth every 4 (four) hours as needed. Patient taking differently: Take 15 mg by mouth 3 (three) times daily.  09/26/15   Tommi Rumps, PA-C  pantoprazole (PROTONIX) 40 MG tablet Take 40 mg by mouth 2 (two) times daily. 10/30/14   [provider]  pravastatin (PRAVACHOL) 20 MG tablet Take 1 tablet by mouth at bedtime.    [provider]  QUEtiapine (SEROQUEL) 400 MG tablet Take 1 tablet by mouth at bedtime.    [provider]    Physical Exam: Vitals:   02/25/20 1841 02/25/20 1842 02/25/20 1848  BP: (!) 138/100    Pulse: 78    Resp: 15    Temp:   97.9 F (36.6 C)  TempSrc:   Oral  SpO2: 92%    Weight:  76.2 kg   Height:  6\' 1"  (1.854 m)      Vitals:   02/25/20 1841 02/25/20 1842 02/25/20 1848  BP: (!) 138/100    Pulse: 78    Resp: 15    Temp:   97.9 F (36.6 C)  TempSrc:   Oral  SpO2: 92%    Weight:  76.2 kg   Height:  6\' 1"  (1.854 m)       Constitutional: Alert and oriented x 3 .  In mild to moderate respiratory distress with work of breathing HEENT:      Head: Normocephalic and atraumatic.         Eyes: PERLA, EOMI, Conjunctivae are normal. Sclera is non-icteric.       Mouth/Throat: Mucous membranes are moist.       Neck: Supple with no signs of meningismus. Cardiovascular: Regular rate and rhythm. No murmurs, gallops, or rubs. 2+ symmetrical distal pulses are present . No JVD. No LE edema Respiratory: Respiratory effort increased.bilateral rhonchi.  Tachypnea Gastrointestinal: Soft, non tender, and non distended with positive bowel sounds. No rebound or guarding. Genitourinary: No CVA tenderness. Musculoskeletal:  Left AKA.  Otherwise nontender with normal range of  motion in all extremities. No edema, cyanosis, or erythema of extremities. Neurologic: Normal speech and language. Face is symmetric. Moving all extremities. No gross focal neurologic deficits . Skin: Skin is warm, dry.  No rash or ulcers Psychiatric: Mood and affect are normal Speech and behavior are normal   Labs on Admission: I have personally reviewed following labs and imaging studies  CBC: Recent Labs  Lab 02/25/20 1849  WBC 6.9  NEUTROABS 3.2  HGB 13.2  HCT 39.0  MCV 100.5*  PLT 272   Basic Metabolic Panel: Recent Labs  Lab 02/25/20 1929  NA 130*  K 4.7  CL 93*  CO2 28  GLUCOSE 109*  BUN 5*  CREATININE 0.87  CALCIUM 8.6*   GFR: Estimated Creatinine Clearance: 110.7 mL/min (by C-G formula based on SCr of 0.87 mg/dL). Liver Function Tests: Recent Labs  Lab 02/25/20 1929  AST 16  ALT 8  ALKPHOS 178*  BILITOT 0.5  PROT 6.7  ALBUMIN 2.9*   No results for input(s): LIPASE, AMYLASE in the last 168 hours. No results for input(s): AMMONIA in the last 168 hours. Coagulation Profile: No results for input(s): INR, PROTIME in the last 168 hours. Cardiac Enzymes: No results for input(s): CKTOTAL, CKMB, CKMBINDEX, TROPONINI in the last 168 hours. BNP (last 3 results) No results for input(s): PROBNP in the last 8760 hours. HbA1C: No results for input(s): HGBA1C in the last 72 hours. CBG: No results for input(s): GLUCAP in the last 168 hours. Lipid Profile: No results for input(s): CHOL, HDL, LDLCALC, TRIG, CHOLHDL, LDLDIRECT in the last 72 hours. Thyroid Function Tests: No results for input(s): TSH, T4TOTAL, FREET4, T3FREE, THYROIDAB in the last 72 hours. Anemia Panel: No results for input(s): VITAMINB12, FOLATE, FERRITIN, TIBC, IRON, RETICCTPCT in the last 72 hours. Urine analysis:    Component Value Date/Time   COLORURINE YELLOW (A) 11/11/2015 1230   APPEARANCEUR CLEAR (A) 11/11/2015 1230   APPEARANCEUR Clear 09/05/2014 0349   LABSPEC 1.025 11/11/2015  1230   LABSPEC 1.012 09/05/2014 0349   PHURINE 9.0 (H) 11/11/2015 1230   GLUCOSEU NEGATIVE 11/11/2015 1230   GLUCOSEU Negative 09/05/2014 0349   HGBUR NEGATIVE 11/11/2015 1230   BILIRUBINUR NEGATIVE 11/11/2015 1230   BILIRUBINUR Negative 09/05/2014 0349   KETONESUR 1+ (A) 11/11/2015 1230   PROTEINUR 100 (A) 11/11/2015 1230   UROBILINOGEN 0.2 04/01/2011 0321   NITRITE NEGATIVE 11/11/2015 1230   LEUKOCYTESUR NEGATIVE 11/11/2015 1230   LEUKOCYTESUR Negative 09/05/2014 0349    Radiological Exams on Admission: DG Chest Portable 1 View  Result Date: 02/25/2020 CLINICAL DATA:  Shortness of breath. Arrived via EMS with difficulty breathing EXAM: PORTABLE CHEST 1 VIEW COMPARISON:  06/01/2018 FINDINGS: Cardiomediastinal contours and hilar structures are normal. Lungs are clear.  No signs of pleural effusion. Spinal degenerative changes. Skeletal structures on limited assessment otherwise unremarkable. IMPRESSION: No acute cardiopulmonary disease. Electronically Signed   By: Donzetta Kohut M.D.   On: 02/25/2020 19:10    EKG: Independently reviewed.   Assessment/Plan Principal Problem:   Acute respiratory failure with hypoxia (HCC)   COPD with acute exacerbation (HCC) -Patient presented with wheezing typical for his COPD exacerbations, not responding with home therapy and with O2 sats in the mid 80s with EMS and increased work of breathing, with minimal improvement with treatment in the ER.  Chest x-ray showed no infiltrate.  Covid PCR pending -Follow-up ABG -Scheduled and as needed bronchodilator treatments -IV Solu-Medrol    DM (diabetes mellitus) (HCC) -Sliding scale insulin coverage  Chronic pain -Continue home long-acting MS Contin with short-acting oxycodone for breakthrough -Continue gabapentin.  Uncertain psychiatric/neurologic illness -Continue Seroquel and Tegretol pending med rec -Patient usually follows at the [facility]    HTN (hypertension) -Continue home meds  PVD with  unilateral AKA, left (HCC) -Continue pravastatin and antiplatelets -Increase assistance with transfers  Remote history of MI, though with negative cardiac cath and stress test in the past -Continue home isosorbide and aspirin, carvedilol and pravastatin -Patient follows at the [place].  Records unavailable at this time    DVT prophylaxis: Lovenox  Code Status: full code  Family Communication:  none  Disposition Plan: Back to previous home environment Consults called: none  Status:At the time of admission, it appears that the appropriate admission status for this patient is INPATIENT. This is judged to be reasonable and necessary in order to provide the required intensity of service to ensure the patient's safety given the presenting symptoms, physical exam findings, and initial radiographic and laboratory data in the context of their  Comorbid conditions.   Patient requires inpatient status due to high intensity of service, high risk for further deterioration and high frequency of surveillance required.   I certify that at the point of admission it is my clinical judgment that the patient will require inpatient hospital care spanning beyond 2 midnights     Andris Baumann MD [facility]     02/25/2020, 9:49 PM

## 2020-02-25 NOTE — ED Provider Notes (Signed)
[facility] Emergency Department Provider Note  ____________________________________________  Time seen: Approximately 6:43 PM  I have reviewed the triage vital signs and the nursing notes.   HISTORY  Chief Complaint Shortness of Breath    HPI SHADOE BETHEL is a 49 y.o. male who presents the emergency department complaining of shortness of breath.  Patient states that he developed increasing shortness of breath and wheezing at home over the past 4 to 5 hours.  Patient states that he does have a history of COPD/asthma and states that typically this is resolved with using his at home inhalers.  Patient states the inhalers did not improve his symptoms, in fact he was worsening and he called EMS.  Patient was given 2 nebs in route, and states that the patient's O2 sats on room air were dropped in between nebulizer treatments.  Patient denies any fevers or chills, nasal congestion, sore throat, cough, chest pain, Donnell pain, nausea vomiting, diarrhea or constipation.  Patient has a history of a collagen vascular disease, COPD, diabetes, hypertension, MI, thrombocytopenia.         Past Medical History:  Diagnosis Date  . Asthma   . Collagen vascular disease (HCC)   . COPD (chronic obstructive pulmonary disease) (HCC)   . Diabetes mellitus without complication (HCC)   . Gastric ulcer   . Hypertension   . Myocardial infarction (HCC)   . Renal disorder   . Thrombocytopenia [facility])     Patient Active Problem List   Diagnosis Date Noted  . Hyponatremia 06/01/2018  . CAP (community acquired pneumonia) 06/01/2018  . GERD (gastroesophageal reflux disease) 06/01/2018  . Necrotizing fasciitis (HCC)   . Diabetic foot infection (HCC) 05/08/2015  . DM (diabetes mellitus) (HCC) 05/08/2015  . HTN (hypertension) 05/08/2015  . COPD (chronic obstructive pulmonary disease) (HCC) 05/08/2015    Past Surgical History:  Procedure Laterality Date  . CHOLECYSTECTOMY     . FOOT SURGERY Left   . left below knee amputation      Prior to Admission medications   Medication Sig Start Date End Date Taking? Authorizing Provider  acetaminophen (TYLENOL) 500 MG tablet Take 1,000 mg by mouth every 6 (six) hours as needed for mild pain.  05/09/12   [provider]  albuterol (PROAIR HFA) 108 (90 BASE) MCG/ACT inhaler Inhale 2 puffs into the lungs every 4 (four) hours as needed for wheezing or shortness of breath.  08/20/14   [provider]  amoxicillin-clavulanate (AUGMENTIN) 875-125 MG tablet Take 1 tablet by mouth 2 (two) times daily. 06/03/18   Adrian Saran, MD  carbamazepine (TEGRETOL) 200 MG tablet Take 1 tablet by mouth 2 (two) times daily.     [provider]  carvedilol (COREG) 12.5 MG tablet Take 1 tablet by mouth 2 (two) times daily.    [provider]  cholecalciferol (VITAMIN D) 1000 units tablet Take 1,000 Units by mouth daily.    [provider]  collagenase (SANTYL) ointment Apply 1 application topically 2 (two) times daily. 02/23/18   Triplett, Rulon Eisenmenger B, FNP  ferrous sulfate 325 (65 FE) MG EC tablet Take 325 mg by mouth daily with breakfast.    [provider]  gabapentin (NEURONTIN) 300 MG capsule Take 2 capsules by mouth 3 (three) times daily. 03/09/15 03/08/16  [provider]  insulin aspart (NOVOLOG) 100 UNIT/ML injection Inject 50 Units into the skin See admin instructions. Per sliding scale    [provider]  insulin glargine (LANTUS) 100 UNIT/ML  injection Inject 10 Units into the skin at bedtime.    [provider]  isosorbide mononitrate (IMDUR) 30 MG 24 hr tablet Take 1 tablet by mouth 2 (two) times daily.    [provider]  morphine (MS CONTIN) 30 MG 12 hr tablet Take 30 mg by mouth every 12 (twelve) hours.    [provider]  Multiple Vitamins-Minerals (MULTIVITAMIN WITH MINERALS) tablet Take 1 tablet by mouth daily.    [provider]   oxycodone (OXY-IR) 5 MG capsule Take 1 capsule (5 mg total) by mouth every 4 (four) hours as needed. Patient taking differently: Take 15 mg by mouth 3 (three) times daily.  09/26/15   Tommi Rumps, PA-C  pantoprazole (PROTONIX) 40 MG tablet Take 40 mg by mouth 2 (two) times daily. 10/30/14   [provider]  pravastatin (PRAVACHOL) 20 MG tablet Take 1 tablet by mouth at bedtime.    [provider]  QUEtiapine (SEROQUEL) 400 MG tablet Take 1 tablet by mouth at bedtime.    [provider]    Allergies Demerol [meperidine], Arithmin [antazoline], Erythromycin, and Zosyn [piperacillin sod-tazobactam so]  Family History  Adopted: Yes    Social History Social History   Tobacco Use  . Smoking status: Current Every Day Smoker    Packs/day: 1.00    Types: Cigarettes  . Smokeless tobacco: Never Used  Substance Use Topics  . Alcohol use: No  . Drug use: No     Review of Systems  Constitutional: No fever/chills Eyes: No visual changes. No discharge ENT: No upper respiratory complaints. Cardiovascular: no chest pain. Respiratory: no cough.  Positive SOB and wheezing.. Gastrointestinal: No abdominal pain.  No nausea, no vomiting.  No diarrhea.  No constipation. Musculoskeletal: Negative for musculoskeletal pain. Skin: Negative for rash, abrasions, lacerations, ecchymosis. Neurological: Negative for headaches, focal weakness or numbness. 10-point ROS otherwise negative.  ____________________________________________   PHYSICAL EXAM:  VITAL SIGNS: ED Triage Vitals  Enc Vitals Group     BP 02/25/20 1841 (!) 138/100     Pulse Rate 02/25/20 1841 78     Resp 02/25/20 1841 15     Temp --      Temp src --      SpO2 02/25/20 1841 92 %     Weight 02/25/20 1842 168 lb (76.2 kg)     Height 02/25/20 1842 6\' 1"  (1.854 m)     Head Circumference --      Peak Flow --      Pain Score 02/25/20 1842 0     Pain Loc --      Pain Edu? --      Excl. in GC? --       Constitutional: Alert and oriented. Well appearing and in no acute distress. Eyes: Conjunctivae are normal. PERRL. EOMI. Head: Atraumatic. ENT:      Ears:       Nose: No congestion/rhinnorhea.      Mouth/Throat: Mucous membranes are moist.  Neck: No stridor.  Hematological/Lymphatic/Immunilogical: No cervical lymphadenopathy Cardiovascular: Normal rate, regular rhythm. Normal S1 and S2.  Good peripheral circulation. Respiratory: Mildly increased respiratory effort without tachypnea but no retractions. Lungs with inspiratory and expiratory wheezing.  No identified rales or rhonchi.  Good air entry to the bases with no decreased or absent breath sounds. Gastrointestinal: Bowel sounds 4 quadrants. Soft and nontender to palpation. No guarding or rigidity. No palpable masses. No distention.  Musculoskeletal: Full range of motion to all extremities. No gross  deformities appreciated. Neurologic:  Normal speech and language. No gross focal neurologic deficits are appreciated.  Skin:  Skin is warm, dry and intact. No rash noted. Psychiatric: Mood and affect are normal. Speech and behavior are normal. Patient exhibits appropriate insight and judgement.   ____________________________________________   LABS (all labs ordered are listed, but only abnormal results are displayed)  Labs Reviewed  CBC WITH DIFFERENTIAL/PLATELET - Abnormal; Notable for the following components:      Result Value   RBC 3.88 (*)    MCV 100.5 (*)    Eosinophils Absolute 0.9 (*)    All other components within normal limits  COMPREHENSIVE METABOLIC PANEL - Abnormal; Notable for the following components:   Sodium 130 (*)    Chloride 93 (*)    Glucose, Bld 109 (*)    BUN 5 (*)    Calcium 8.6 (*)    Albumin 2.9 (*)    Alkaline Phosphatase 178 (*)    All other components within normal limits  SARS CORONAVIRUS 2 BY RT PCR (HOSPITAL ORDER, PERFORMED IN [facility])  LACTIC ACID, PLASMA  BLOOD GAS,  ARTERIAL  TROPONIN I (HIGH SENSITIVITY)  TROPONIN I (HIGH SENSITIVITY)   ____________________________________________  EKG  ED ECG REPORT I, Delorise Royals Lydell Moga,  personally viewed and interpreted this ECG.   Date: 02/25/2020  EKG Time: 1842 hrs.  Rate: 70 bpm  Rhythm: normal sinus rhythm  Axis: Borderline right axis deviation.  Intervals:Short PR interval  ST&T Change: No ST elevation or depression noted  Normal sinus rhythm.  No STEMI.  ____________________________________________  RADIOLOGY I personally viewed and evaluated these images as part of my medical decision making, as well as reviewing the written report by the radiologist.  DG Chest Portable 1 View  Result Date: 02/25/2020 CLINICAL DATA:  Shortness of breath. Arrived via EMS with difficulty breathing EXAM: PORTABLE CHEST 1 VIEW COMPARISON:  06/01/2018 FINDINGS: Cardiomediastinal contours and hilar structures are normal. Lungs are clear.  No signs of pleural effusion. Spinal degenerative changes. Skeletal structures on limited assessment otherwise unremarkable. IMPRESSION: No acute cardiopulmonary disease. Electronically Signed   By: Donzetta Kohut M.D.   On: 02/25/2020 19:10    ____________________________________________    PROCEDURES  Procedure(s) performed:    Procedures    Medications  magnesium sulfate IVPB 2 g 50 mL (2 g Intravenous New Bag/Given 02/25/20 2054)  albuterol (PROVENTIL) (2.5 MG/3ML) 0.083% nebulizer solution 5 mg (5 mg Nebulization Given 02/25/20 1859)  methylPREDNISolone sodium succinate (SOLU-MEDROL) 125 mg/2 mL injection 125 mg (125 mg Intravenous Given 02/25/20 1902)  ipratropium-albuterol (DUONEB) 0.5-2.5 (3) MG/3ML nebulizer solution 6 mL (6 mLs Nebulization Given 02/25/20 2055)     ____________________________________________   INITIAL IMPRESSION / ASSESSMENT AND PLAN / ED COURSE  Pertinent labs & imaging results that were available during my care of the patient were  reviewed by me and considered in my medical decision making (see chart for details).  Review of the [place] CSRS was performed in accordance of the [facility] prior to dispensing any controlled drugs.  Clinical Course as of Feb 25 2115  Wynelle Link Feb 25, 2020  2026 Patient presented to the emergency department complaining of difficulty breathing.  Patient with history of COPD.  Had use inhalers without any relief.  Patient was having inspiratory and expiratory wheezing bilaterally on initial exam.  Increased work of breathing though not tachypneic.  Patient's O2 sat was initially 92%.  Patient was placed on nasal cannula, given double albuterol treatment.  Patient was also given IV Solu-Medrol.  Reevaluation an hour later reveals that the patient still has significant wheezing.  At this time DuoNeb treatments and mag sulfate will be administered.   [JC]    Clinical Course User Index [JC] Valetta Mulroy, Charline Bills, PA-C          Patient's diagnosis is consistent with COPD exacerbation.  Patient presented to the emergency department complaining of increased difficulty breathing, wheezing x4 to 5 hours.  Patient states that he has a history of COPD, symptoms began like a COPD exacerbation.  Patient used multiple doses of his inhaler without relief.  Patient called EMS and was transported to the emergency department having received 2 albuterol treatments in route.  Patient was not placed on oxygen with EMS and on room air patient would desat into the low 80s between breathing treatments.  Patient was placed on nasal cannula, started on more nebulizer treatments and Solu-Medrol.  Reassessment of the patient revealed no improvement and DuoNeb treatments and mag sulfate was initiated.  At this time without significant improvement multiple treatments I feel the patient will require admission for further management of his COPD exacerbation.  Will consult hospitalist for admission..  .     ____________________________________________  FINAL CLINICAL IMPRESSION(S) / ED DIAGNOSES  Final diagnoses:  COPD exacerbation ([place])      NEW MEDICATIONS STARTED DURING THIS VISIT:  ED Discharge Orders    None          This chart was dictated using voice recognition software/Dragon. Despite best efforts to proofread, errors can occur which can change the meaning. Any change was purely unintentional.    Darletta Moll, PA-C 02/25/20 2116    Lilia Pro., MD 02/26/20 [phone]

## 2020-02-25 NOTE — ED Notes (Signed)
This RN noted the pt's O2 to be 88% on [facility] and very somnolent. Pt was arousable with raised voice and forceful physical stimulation. Pt unable to explain why he's so sleepy at this time. O2 was increased to 4LNC and O2 increased to 91-93%, EDP notified. Narcotics held at this time due to safety concern

## 2020-02-25 NOTE — ED Triage Notes (Signed)
Pt arrives via EMS from home after having difficulty breathing for 5 hours- pt was given 2 nebs in route and per EMS when changing nebs the pt sats would drop- pt RA sats in low 80s per EMS- pt has hx of COPD and asthma- pt had audible wheezes and sounds tight

## 2020-02-25 NOTE — ED Notes (Signed)
Pt on the phone with his "wife" at this time asking her to "pull .7 out to the side" and telling her " I still need you to bring me something up here.... yeah they'll let you in. I need it". This RN is unsure as to what the pt is talking about but EDP notified. Pt at this time asking for his scheduled morphine and oxycodone that he takes at home. This RN stated that I would ask the EDP

## 2020-02-26 ENCOUNTER — Encounter: Payer: Self-pay | Admitting: Internal Medicine

## 2020-02-26 DIAGNOSIS — S78112A Complete traumatic amputation at level between left hip and knee, initial encounter: Secondary | ICD-10-CM

## 2020-02-26 DIAGNOSIS — J441 Chronic obstructive pulmonary disease with (acute) exacerbation: Principal | ICD-10-CM

## 2020-02-26 DIAGNOSIS — J9602 Acute respiratory failure with hypercapnia: Secondary | ICD-10-CM

## 2020-02-26 LAB — BLOOD GAS, ARTERIAL
Acid-Base Excess: 9.6 mmol/L — ABNORMAL HIGH (ref 0.0–2.0)
Bicarbonate: 36.1 mmol/L — ABNORMAL HIGH (ref 20.0–28.0)
FIO2: 0.36
O2 Saturation: 92.6 %
Patient temperature: 37
pCO2 arterial: 57 mmHg — ABNORMAL HIGH (ref 32.0–48.0)
pH, Arterial: 7.41 (ref 7.350–7.450)
pO2, Arterial: 65 mmHg — ABNORMAL LOW (ref 83.0–108.0)

## 2020-02-26 LAB — GLUCOSE, CAPILLARY
Glucose-Capillary: 172 mg/dL — ABNORMAL HIGH (ref 70–99)
Glucose-Capillary: 161 mg/dL — ABNORMAL HIGH (ref 70–99)
Glucose-Capillary: 129 mg/dL — ABNORMAL HIGH (ref 70–99)
Glucose-Capillary: 107 mg/dL — ABNORMAL HIGH (ref 70–99)

## 2020-02-26 LAB — HEMOGLOBIN A1C
Hgb A1c MFr Bld: 6.3 % — ABNORMAL HIGH (ref 4.8–5.6)
Mean Plasma Glucose: 134.11 mg/dL

## 2020-02-26 LAB — TROPONIN I (HIGH SENSITIVITY): Troponin I (High Sensitivity): 6 ng/L (ref <18)

## 2020-02-26 MED ORDER — CARVEDILOL 3.125 MG PO TABS
12.5000 mg | ORAL_TABLET | Freq: Two times a day (BID) | ORAL | Status: DC
  Administered 2020-02-27 – 2020-02-28 (×2): 12.5 mg via ORAL
  Filled 2020-02-26 (×3): qty 4

## 2020-02-26 MED ORDER — INSULIN GLARGINE 100 UNIT/ML ~~LOC~~ SOLN
10.0000 [IU] | Freq: Every day | SUBCUTANEOUS | Status: DC
  Administered 2020-02-26: 22:00:00 10 [IU] via SUBCUTANEOUS
  Filled 2020-02-26 (×3): qty 0.1

## 2020-02-26 MED ORDER — QUETIAPINE FUMARATE 200 MG PO TABS
400.0000 mg | ORAL_TABLET | Freq: Every day | ORAL | Status: DC
  Administered 2020-02-26 – 2020-02-27 (×2): 400 mg via ORAL
  Filled 2020-02-26 (×3): qty 2

## 2020-02-26 MED ORDER — RIVAROXABAN 10 MG PO TABS
10.0000 mg | ORAL_TABLET | Freq: Every day | ORAL | Status: DC
  Filled 2020-02-26: qty 1

## 2020-02-26 MED ORDER — LORAZEPAM 2 MG/ML IJ SOLN
1.0000 mg | Freq: Once | INTRAMUSCULAR | Status: AC
  Administered 2020-02-26: 1 mg via INTRAVENOUS
  Filled 2020-02-26: qty 1

## 2020-02-26 MED ORDER — PANTOPRAZOLE SODIUM 40 MG PO TBEC
40.0000 mg | DELAYED_RELEASE_TABLET | Freq: Two times a day (BID) | ORAL | Status: DC
  Administered 2020-02-26 – 2020-02-28 (×4): 40 mg via ORAL
  Filled 2020-02-26 (×4): qty 1

## 2020-02-26 MED ORDER — RIVAROXABAN 20 MG PO TABS
20.0000 mg | ORAL_TABLET | Freq: Every day | ORAL | Status: DC
  Administered 2020-02-26 – 2020-02-27 (×2): 20 mg via ORAL
  Filled 2020-02-26 (×3): qty 1

## 2020-02-26 MED ORDER — ENSURE ENLIVE PO LIQD
237.0000 mL | Freq: Three times a day (TID) | ORAL | Status: DC
  Administered 2020-02-26 – 2020-02-28 (×5): 237 mL via ORAL

## 2020-02-26 MED ORDER — CARBAMAZEPINE 200 MG PO TABS
200.0000 mg | ORAL_TABLET | Freq: Two times a day (BID) | ORAL | Status: DC
  Administered 2020-02-26 – 2020-02-28 (×4): 200 mg via ORAL
  Filled 2020-02-26 (×5): qty 1

## 2020-02-26 MED ORDER — ONDANSETRON HCL 4 MG/2ML IJ SOLN
4.0000 mg | Freq: Four times a day (QID) | INTRAMUSCULAR | Status: DC | PRN
  Administered 2020-02-26: 04:00:00 4 mg via INTRAVENOUS
  Filled 2020-02-26: qty 2

## 2020-02-26 MED ORDER — PRAVASTATIN SODIUM 20 MG PO TABS
20.0000 mg | ORAL_TABLET | Freq: Every day | ORAL | Status: DC
  Administered 2020-02-26 – 2020-02-27 (×2): 20 mg via ORAL
  Filled 2020-02-26 (×2): qty 1

## 2020-02-26 MED ORDER — FERROUS SULFATE 325 (65 FE) MG PO TABS
325.0000 mg | ORAL_TABLET | Freq: Every day | ORAL | Status: DC
  Administered 2020-02-27 – 2020-02-28 (×2): 325 mg via ORAL
  Filled 2020-02-26 (×2): qty 1

## 2020-02-26 MED ORDER — LORAZEPAM 1 MG PO TABS: 1.0000 mg | ORAL_TABLET | Freq: Once | ORAL | Status: DC

## 2020-02-26 MED ORDER — GABAPENTIN 300 MG PO CAPS
900.0000 mg | ORAL_CAPSULE | Freq: Three times a day (TID) | ORAL | Status: DC
  Administered 2020-02-26 – 2020-02-28 (×6): 900 mg via ORAL
  Filled 2020-02-26 (×6): qty 3

## 2020-02-26 MED ORDER — ADULT MULTIVITAMIN W/MINERALS CH
1.0000 | ORAL_TABLET | Freq: Every day | ORAL | Status: DC
  Administered 2020-02-27 – 2020-02-28 (×2): 1 via ORAL
  Filled 2020-02-26 (×2): qty 1

## 2020-02-26 NOTE — TOC Progression Note (Signed)
Transition of Care [facility]) - Progression Note    Patient Details  Name: [name] MRN: [id number] Date of Birth: 1971-06-12  Transition of [facility]) CM/SW Contact  Katyra Tomassetti, Lemar Livings, LCSW Phone Number: 02/26/2020, 3:21 PM  Clinical Narrative:  Referral made to [facility] for follow up PT and OT once medically ready for discharge home. Will continue to follow and see if will need home O2 at discharge.           Expected Discharge Plan and Services                                                 Social Determinants of Health (SDOH) Interventions    Readmission Risk Interventions No flowsheet data found.

## 2020-02-26 NOTE — Evaluation (Signed)
Occupational Therapy Evaluation Patient Details Name: [name] MRN: [id number] DOB: 07-08-1971 Today's Date: 02/26/2020    History of Present Illness  [name] is a 49 y.o. male with medical history significant for diabetes, hypertension, peripheral vascular disease status post left AKA, questionable history of past MI, followed at the [facility] who presents to the emergency room with shortness of breath and wheezing not responding to home bronchodilator therapy.    Clinical Impression   [name] was seen for OT evaluation this date. Prior to hospital admission, pt was MOD I c w/c for mobility and ADLs, wife assists for IADLs. Pt reports bathroom is too small for w/c access so pt uses multiple grab bars + counter to hop in bathroom and tub t/f. Pt reports potential possibility for LLE prosthetic - shares feeling anxious that he may not be a candidate. Pt lives in single story home c ramped entrance and wife available 24/7. Pt presents to acute OT demonstrating impaired ADL performance and functional mobility 2/2 decreased activity tolerance, functional strength/balance deficits, and decreased safety awareness. Pt currently requires CGA for SPT bed<>BSC - SpO2 desat from 93% on 4 L [place] at rest in bed to 84% seated on BSC following ADL t/f - resolved to 90% c PLB. INDEPENDENT for bed level ADLs including self feeding, opening meal packages, and donning R. Anticipate MIN A + AD for full LBD c sit<>stand. Pt would benefit from skilled OT to address noted impairments and functional limitations (see below for any additional details) in order to maximize safety and independence while minimizing falls risk and caregiver burden. Upon hospital discharge, recommend HHOT to maximize pt safety and return to functional independence during meaningful occupations of daily life.    Follow Up Recommendations  Home health OT    Equipment Recommendations  None recommended by OT    Recommendations  for Other Services       Precautions / Restrictions Precautions Precautions: Fall Restrictions Weight Bearing Restrictions: No      Mobility Bed Mobility Overal bed mobility: Modified Independent             General bed mobility comments: Sup<>sit c supervision and good static sitting balance  Transfers Overall transfer level: Needs assistance Equipment used: None Transfers: Sit to/from Stand;Stand Pivot Transfers Sit to Stand: Min guard Stand pivot transfers: Min guard       General transfer comment: Pt required CGA SPT bed<>BSC and assist for managing lines/leads    Balance Overall balance assessment: Needs assistance Sitting-balance support: No upper extremity supported;Feet supported Sitting balance-Leahy Scale: Good     Standing balance support: No upper extremity supported Standing balance-Leahy Scale: Fair                             ADL either performed or assessed with clinical judgement   ADL Overall ADL's : Needs assistance/impaired                                       General ADL Comments: INDEPENDENT seated ADLs including opening meal packages and donning R sock at bed level. Anticipate MIN A + AD for full LBD c sit<>stand. CGA for SPT bed<>BSC.      Vision Baseline Vision/History: Wears glasses       Perception     Praxis      Pertinent  Vitals/Pain Pain Assessment: No/denies pain     Hand Dominance Right   Extremity/Trunk Assessment Upper Extremity Assessment Upper Extremity Assessment: Overall WFL for tasks assessed   Lower Extremity Assessment Lower Extremity Assessment: Overall WFL for tasks assessed (L AKA )       Communication Communication Communication: No difficulties   Cognition Arousal/Alertness: Awake/alert Behavior During Therapy: WFL for tasks assessed/performed Overall Cognitive Status: Within Functional Limits for tasks assessed                                      General Comments  SpO2 93% on 4L [place] seated EOB - desat to 87% c minimal exertion in sitting - resolved c PLB and seated rest breaks. SpO2 desat 84% on 4 L [place] seated on BSC following ADL t/f - resolved to 90% c PLB. Resolved to 94% c return to bed    Exercises Exercises: Other exercises Other Exercises Other Exercises: Pt educated re: OT role, DME recs, d/c recs, safe t/f technique, energy conservation strategies, falls prevention, home/routines modifications Other Exercises: LBD, self-feeding including opening packages, bed mobility, sup<>sit, sit<>stand, SPT, PLB    Shoulder Instructions      Home Living Family/patient expects to be discharged to:: Private residence Living Arrangements: Spouse/significant other Available Help at Discharge: Family;Available 24 hours/day Type of Home: House Home Access: Ramped entrance     Home Layout: One level     Bathroom Shower/Tub: Teacher, early years/pre: Standard Bathroom Accessibility: No   Home Equipment: Shower seat;Grab bars - tub/shower;Grab bars - toilet;Wheelchair - manual   Additional Comments: Pt reports bathroom too small for TTB       Prior Functioning/Environment Level of Independence: Independent with assistive device(s)        Comments: Pt uses w/c for mobility - reports bathroom is too small for w/c access so pt uses multiple grab bars + counter to hop in bathroom and tub t/f. Independent BADLs c assist from wife for IADLs. Pt reports potential possibility for LLE prosthetic         OT Problem List: Decreased strength;Decreased activity tolerance;Impaired balance (sitting and/or standing);Decreased knowledge of use of DME or AE;Cardiopulmonary status limiting activity      OT Treatment/Interventions: Self-care/ADL training;Therapeutic exercise;Energy conservation;DME and/or AE instruction;Therapeutic activities;Patient/family education;Balance training    OT Goals(Current goals can be found in the care  plan section) Acute Rehab OT Goals Patient Stated Goal: To return home OT Goal Formulation: With patient Time For Goal Achievement: 03/11/20 Potential to Achieve Goals: Good ADL Goals Pt Will Perform Lower Body Dressing: with modified independence;sit to/from stand (c LRAD PRN) Pt Will Transfer to Toilet: with modified independence;stand pivot transfer;regular height toilet (c LRAD PRN) Additional ADL Goal #1: Pt will independently verbalize plan to implement x3 energy conservation strategeis  OT Frequency: Min 1X/week   Barriers to D/C: Inaccessible home environment          Co-evaluation              AM-PAC OT "6 Clicks" Daily Activity     Outcome Measure Help from another person eating meals?: None Help from another person taking care of personal grooming?: None Help from another person toileting, which includes using toliet, bedpan, or urinal?: A Little Help from another person bathing (including washing, rinsing, drying)?: A Little Help from another person to put on and taking off regular upper body clothing?: None  Help from another person to put on and taking off regular lower body clothing?: A Little 6 Click Score: 21   End of Session Equipment Utilized During Treatment: Gait belt;Oxygen (4L [place]) Nurse Communication: Mobility status  Activity Tolerance: Patient tolerated treatment well Patient left: in bed;with call bell/phone within reach;with bed alarm set  OT Visit Diagnosis: Unsteadiness on feet (R26.81);Other abnormalities of gait and mobility (R26.89)                Time: 7824-2353 OT Time Calculation (min): 25 min Charges:  OT General Charges $OT Visit: 1 Visit OT Evaluation $OT Eval Low Complexity: 1 Low OT Treatments $Self Care/Home Management : 23-37 mins  Kathie Dike, M.S. OTR/L  02/26/20, 9:33 AM

## 2020-02-26 NOTE — Progress Notes (Addendum)
Progress Note    [name]  RFF:[id number] DOB: 1971/06/29  DOA: 02/25/2020 PCP: [facility]      Brief Narrative:    Medical records reviewed and are as summarized below:  BENJERMIN KORBER is a 49 y.o. male  with medical history significant for diabetes, hypertension, peripheral vascular disease status post left AKA, questionable history of past MI, followed at the [facility] who presented to the emergency room with cough productive of clear sputum, shortness of breath and wheezing not responding to home bronchodilator therapy.  He was brought in by EMS and O2 sats reportedly in the mid 80s on their arrival.  He was admitted to the hospital for acute COPD exacerbation acute hypoxemic and hypercapnic respiratory failure.      Assessment/Plan:   Principal Problem:   COPD with acute exacerbation (HCC) Active Problems:   DM (diabetes mellitus) (HCC)   HTN (hypertension)   Acute respiratory failure with hypoxia and hypercapnia (HCC)   Unilateral AKA, left (HCC)   Acute COPD exacerbation: Continue steroids, bronchodilators and empiric IV Rocephin.  Acute hypoxemic and hypercapnic respiratory failure: Oxygen via nasal cannula as needed.  Repeat ABG.  Insulin dependent diabetes mellitus: Continue Lantus.  NovoLog as needed for hyperglycemia.  CAD, PVD with left AKA, hypertension: Continue Coreg and pravastatin.  Hold isosorbide since BP is soft.  Psychiatric disorder: Continue Seroquel  Body mass index is 22.08 kg/m.   Family Communication/Anticipated D/C date and plan/Code Status   DVT prophylaxis: rivaroxaban (XARELTO) tablet 10 mg Start: 02/26/20 2200 rivaroxaban (XARELTO) tablet 10 mg   Code Status: Full code Family Communication: Plan discussed with patient Disposition Plan:    Status is: Inpatient  Remains inpatient appropriate because:IV treatments appropriate due to intensity of illness or inability to take PO and Inpatient level  of care appropriate due to severity of illness   Dispo: The patient is from: Home              Anticipated d/c is to: Home              Anticipated d/c date is: 1 day              Patient currently is not medically stable to d/c.           Subjective:   He complains of wheezing and cough productive of "clear" sputum.  Breathing is a little better today.  Objective:    Vitals:   02/26/20 0200 02/26/20 0400 02/26/20 0804 02/26/20 1200  BP:   118/75 107/70  Pulse: 81 89 88 75  Resp: (!) 23 20 12 15   Temp:   98.4 F (36.9 C) 98.1 F (36.7 C)  TempSrc:   Oral Oral  SpO2: 97% 92% 90% 97%  Weight:      Height:       No data found.   Intake/Output Summary (Last 24 hours) at 02/26/2020 1242 Last data filed at 02/26/2020 1018 Gross per 24 hour  Intake 460 ml  Output --  Net 460 ml   Filed Weights   02/25/20 1842 02/26/20 0118  Weight: 76.2 kg 75.9 kg    Exam:  GEN: NAD SKIN: Chronic right leg wounds EYES: EOMI ENT: MMM CV: RRR PULM: Decreased air entry bilaterally, bilateral expiratory wheezing ABD: soft, ND, NT, +BS CNS: AAO x 3, non focal EXT: Right leg edema without tenderness.  I could not palpate dorsalis pedis or posterior tibialis pulse but the right  foot is warm to touch.  Left AKA   Data Reviewed:   I have personally reviewed following labs and imaging studies:  Labs: Labs show the following:   Basic Metabolic Panel: Recent Labs  Lab 02/25/20 1929  NA 130*  K 4.7  CL 93*  CO2 28  GLUCOSE 109*  BUN 5*  CREATININE 0.87  CALCIUM 8.6*   GFR Estimated Creatinine Clearance: 110.3 mL/min (by C-G formula based on SCr of 0.87 mg/dL). Liver Function Tests: Recent Labs  Lab 02/25/20 1929  AST 16  ALT 8  ALKPHOS 178*  BILITOT 0.5  PROT 6.7  ALBUMIN 2.9*   No results for input(s): LIPASE, AMYLASE in the last 168 hours. No results for input(s): AMMONIA in the last 168 hours. Coagulation profile No results for input(s): INR, PROTIME  in the last 168 hours.  CBC: Recent Labs  Lab 02/25/20 1849  WBC 6.9  NEUTROABS 3.2  HGB 13.2  HCT 39.0  MCV 100.5*  PLT 272   Cardiac Enzymes: No results for input(s): CKTOTAL, CKMB, CKMBINDEX, TROPONINI in the last 168 hours. BNP (last 3 results) No results for input(s): PROBNP in the last 8760 hours. CBG: Recent Labs  Lab 02/25/20 2326 02/26/20 0806 02/26/20 1156  GLUCAP 166* 172* 161*   D-Dimer: No results for input(s): DDIMER in the last 72 hours. Hgb A1c: Recent Labs    02/26/20 0523  HGBA1C 6.3*   Lipid Profile: No results for input(s): CHOL, HDL, LDLCALC, TRIG, CHOLHDL, LDLDIRECT in the last 72 hours. Thyroid function studies: No results for input(s): TSH, T4TOTAL, T3FREE, THYROIDAB in the last 72 hours.  Invalid input(s): FREET3 Anemia work up: No results for input(s): VITAMINB12, FOLATE, FERRITIN, TIBC, IRON, RETICCTPCT in the last 72 hours. Sepsis Labs: Recent Labs  Lab 02/25/20 1849 02/25/20 1929  WBC 6.9  --   LATICACIDVEN  --  0.8    Microbiology Recent Results (from the past 240 hour(s))  SARS Coronavirus 2 by RT PCR (hospital order, performed in [facility] hospital lab) Nasopharyngeal Nasopharyngeal Swab     Status: None   Collection Time: 02/25/20  8:56 PM   Specimen: Nasopharyngeal Swab  Result Value Ref Range Status   SARS Coronavirus 2 NEGATIVE NEGATIVE Final    Comment: (NOTE) SARS-CoV-2 target nucleic acids are NOT DETECTED.  The SARS-CoV-2 RNA is generally detectable in upper and lower respiratory specimens during the acute phase of infection. The lowest concentration of SARS-CoV-2 viral copies this assay can detect is 250 copies / mL. A negative result does not preclude SARS-CoV-2 infection and should not be used as the sole basis for treatment or other patient management decisions.  A negative result may occur with improper specimen collection / handling, submission of specimen other than nasopharyngeal swab, presence of  viral mutation(s) within the areas targeted by this assay, and inadequate number of viral copies (<250 copies / mL). A negative result must be combined with clinical observations, patient history, and epidemiological information.  Fact Sheet for Patients:   BoilerBrush.com.cy  Fact Sheet for Healthcare Providers: https://pope.com/  This test is not yet approved or  cleared by the [place] FDA and has been authorized for detection and/or diagnosis of SARS-CoV-2 by FDA under an Emergency Use Authorization (EUA).  This EUA will remain in effect (meaning this test can be used) for the duration of the COVID-19 declaration under Section 564(b)(1) of the Act, 21 U.S.C. section 360bbb-3(b)(1), unless the authorization is terminated or revoked sooner.  Performed at [facility]  [facility], [address] [place], [place] [address]     Procedures and diagnostic studies:  DG Chest Portable 1 View  Result Date: 02/25/2020 CLINICAL DATA:  Shortness of breath. Arrived via EMS with difficulty breathing EXAM: PORTABLE CHEST 1 VIEW COMPARISON:  06/01/2018 FINDINGS: Cardiomediastinal contours and hilar structures are normal. Lungs are clear.  No signs of pleural effusion. Spinal degenerative changes. Skeletal structures on limited assessment otherwise unremarkable. IMPRESSION: No acute cardiopulmonary disease. Electronically Signed   By: Zetta Bills M.D.   On: 02/25/2020 19:10    Medications:   . carbamazepine  200 mg Oral BID  . carvedilol  12.5 mg Oral BID  . [START ON 02/27/2020] ferrous sulfate  325 mg Oral Q breakfast  . gabapentin  900 mg Oral TID  . insulin aspart  0-15 Units Subcutaneous TID WC  . insulin aspart  0-5 Units Subcutaneous QHS  . insulin glargine  10 Units Subcutaneous QHS  . ipratropium-albuterol  3 mL Nebulization Q6H  . methylPREDNISolone (SOLU-MEDROL) injection  40 mg Intravenous Q6H   Followed by  . [START ON  02/27/2020] predniSONE  40 mg Oral Q breakfast  . morphine  30 mg Oral Q12H  . pantoprazole  40 mg Oral BID  . pravastatin  20 mg Oral QHS  . QUEtiapine  400 mg Oral QHS  . rivaroxaban  10 mg Oral QHS   Continuous Infusions: . cefTRIAXone (ROCEPHIN)  IV Stopped (02/25/20 2338)     LOS: 1 day   Othello Sgroi  [facility]     02/26/2020, 12:42 PM

## 2020-02-26 NOTE — ED Notes (Signed)
Pt incredibly somnolent prior to Narcan push with garbled speach. Pt still unable to explain his change in mentation. Narcan was pushed at this time. Pt became alert and imn

## 2020-02-26 NOTE — Evaluation (Signed)
Physical Therapy Evaluation Patient Details Name: [name] MRN: [id number] DOB: [birth year] Today's Date: 02/26/2020   History of Present Illness   [name] is a 49 y.o. male with medical history significant for diabetes, hypertension, peripheral vascular disease status post left AKA, questionable history of past MI, followed at the [facility] who presents to the emergency room with shortness of breath and wheezing not responding to home bronchodilator therapy.   Clinical Impression  Patient received in bed, reports he is tired. Agrees to PT assessment. He is mod independent with bed mobility and transfers. He was able to hop using RW approx 12 feet. Supervision. He demonstrates weakness and will benefit from continued skilled PT while here to improve functional mobility and strength for safe return home.     Follow Up Recommendations No PT follow up    Equipment Recommendations  None recommended by PT    Recommendations for Other Services       Precautions / Restrictions Precautions Precautions: Fall Restrictions Weight Bearing Restrictions: No      Mobility  Bed Mobility Overal bed mobility: Modified Independent             General bed mobility comments: Sup<>sit c supervision and good static sitting balance  Transfers Overall transfer level: Modified independent Equipment used: Rolling walker (2 wheeled) Transfers: Sit to/from [facility] Sit to Stand: Modified independent (Device/Increase time) Stand pivot transfers: Min guard       General transfer comment: Pt required CGA SPT bed<>BSC and assist for managing lines/leads  Ambulation/Gait Ambulation/Gait assistance: Supervision Gait Distance (Feet): 12 Feet Assistive device: Rolling walker (2 wheeled)   Gait velocity: WNL   General Gait Details: patient appears generally safe with hopping using RW. Good balance demonstrated during turning. Supervision due to  lines/leads.  Stairs            Wheelchair Mobility    Modified Rankin (Stroke Patients Only)       Balance Overall balance assessment: Modified Independent Sitting-balance support: Feet supported Sitting balance-Leahy Scale: Good     Standing balance support: Bilateral upper extremity supported;During functional activity Standing balance-Leahy Scale: Good                               Pertinent Vitals/Pain Pain Assessment: No/denies pain    Home Living Family/patient expects to be discharged to:: Private residence Living Arrangements: Spouse/significant other Available Help at Discharge: Family;Available 24 hours/day Type of Home: House Home Access: Ramped entrance     Home Layout: One level Home Equipment: Shower seat;Grab bars - tub/shower;Grab bars - toilet;Wheelchair - [employer] - 2 wheels Additional Comments: Pt reports bathroom too small for TTB     Prior Function Level of Independence: Independent with assistive device(s)         Comments: Pt uses w/c for mobility - reports bathroom is too small for w/c access so pt uses multiple grab bars + counter to hop in bathroom and tub t/f. Independent BADLs c assist from wife for IADLs. Pt reports potential possibility for LLE prosthetic      Hand Dominance   Dominant Hand: Right    Extremity/Trunk Assessment   Upper Extremity Assessment Upper Extremity Assessment: Defer to OT evaluation    Lower Extremity Assessment Lower Extremity Assessment: Overall WFL for tasks assessed    Cervical / Trunk Assessment Cervical / Trunk Assessment: Normal  Communication   Communication: No difficulties  Cognition  Arousal/Alertness: Awake/alert Behavior During Therapy: WFL for tasks assessed/performed Overall Cognitive Status: Within Functional Limits for tasks assessed                                 General Comments: reports he is tired      General Comments General comments  (skin integrity, edema, etc.): Patient desat to 74% on 4 lpm during ambulation. instructed to perform pursed lip breathing. Sats returned to 95% with rest.    Exercises Other Exercises Other Exercises: Pt educated re: OT role, DME recs, d/c recs, safe t/f technique, energy conservation strategies, falls prevention, home/routines modifications Other Exercises: LBD, self-feeding including opening packages, bed mobility, sup<>sit, sit<>stand, SPT, PLB    Assessment/Plan    PT Assessment Patient needs continued PT services  PT Problem List Decreased strength;Decreased mobility;Decreased activity tolerance       PT Treatment Interventions Therapeutic activities;Gait training;Therapeutic exercise;Functional mobility training;Patient/family education    PT Goals (Current goals can be found in the Care Plan section)  Acute Rehab PT Goals Patient Stated Goal: To return home PT Goal Formulation: With patient Time For Goal Achievement: 03/04/20 Potential to Achieve Goals: Good    Frequency Min 2X/week   Barriers to discharge        Co-evaluation               AM-PAC PT "6 Clicks" Mobility  Outcome Measure Help needed turning from your back to your side while in a flat bed without using bedrails?: None Help needed moving from lying on your back to sitting on the side of a flat bed without using bedrails?: None Help needed moving to and from a bed to a chair (including a wheelchair)?: A Little Help needed standing up from a chair using your arms (e.g., wheelchair or bedside chair)?: None Help needed to walk in hospital room?: A Little Help needed climbing 3-5 steps with a railing? : A Little 6 Click Score: 21    End of Session Equipment Utilized During Treatment: Gait belt Activity Tolerance: Patient tolerated treatment well;Patient limited by lethargy Patient left: with bed alarm set;in bed;with call bell/phone within reach Nurse Communication: Mobility status PT Visit  Diagnosis: Muscle weakness (generalized) (M62.81);Difficulty in walking, not elsewhere classified (R26.2)    Time: 1000-1009 PT Time Calculation (min) (ACUTE ONLY): 9 min   Charges:   PT Evaluation $PT Eval Low Complexity: 1 Low          Josey Forcier, PT, GCS 02/26/20,11:32 AM

## 2020-02-26 NOTE — Progress Notes (Signed)
Initial Nutrition Assessment  DOCUMENTATION CODES:   Not applicable  INTERVENTION:   Ensure Enlive po TID, each supplement provides 350 kcal and 20 grams of protein  MVI daily   Liberalize diet   NUTRITION DIAGNOSIS:   Increased nutrient needs related to catabolic illness (COPD) as evidenced by increased estimated needs  GOAL:   Patient will meet greater than or equal to 90% of their needs  MONITOR:   PO intake, Supplement acceptance, Labs, Weight trends, Skin, I & O's  REASON FOR ASSESSMENT:   Consult Assessment of nutrition requirement/status  ASSESSMENT:   49 y.o. male with medical history significant for COPD, diabetes, hypertension, peripheral vascular disease status post left AKA, questionable history of past MI, followed at the [facility] who presents to the emergency room with shortness of breath and wheezing not responding to home bronchodilator therapy.  RD working remotely.  Spoke with pt via phone. Pt reports fairly good appetite and oral intake today and pta. Pt reports eating 75-100% of meals in hospital. Pt reports that he does not drink supplements at home but he is willing to try chocolate Ensure in hospital. RD will add supplements and MVI to help pt meet his estimated needs. RD will also liberalize the heart healthy portion of pt's diet as this is restrictive of protein. There is no weight history in chart to determine if any significant weight changes but pt reports his weight is stable. Pt is at high risk for malnutrition.   Medications reviewed and include: ferrous sulfate, insulin, prednisone, MVI, morphine, protonix, ceftriaxone  Labs reviewed: Na 130(L), BUN 5(L),  cbgs- 172, 161, 129 x 24 hrs AIC 6.3(H)  NUTRITION - FOCUSED PHYSICAL EXAM:  Unable to perform at this time    Diet Order:   Diet Order            Diet heart healthy/carb modified Room service appropriate? Yes; Fluid consistency: Thin  Diet effective now                 EDUCATION NEEDS:   Education needs have been addressed  Skin:  Skin Assessment: Reviewed RN Assessment (pretibial wound, laceration arm)  Last BM:  PTA  Height:   Ht Readings from Last 1 Encounters:  02/26/20 6\' 1"  (1.854 m)    Weight:   Wt Readings from Last 1 Encounters:  02/26/20 75.9 kg    Ideal Body Weight:  83.6 kg  BMI:  Body mass index is 22.08 kg/m.  Estimated Nutritional Needs:   Kcal:  2200-2500kcal/day  Protein:  110-125g/day  Fluid:  >2.2L/day  02/28/20 MS, RD, LDN Please refer to [facility] for RD and/or RD on-call/weekend/after hours pager

## 2020-02-27 DIAGNOSIS — I10 Essential (primary) hypertension: Secondary | ICD-10-CM

## 2020-02-27 LAB — CBC
HCT: 36.3 % — ABNORMAL LOW (ref 39.0–52.0)
Hemoglobin: 12.7 g/dL — ABNORMAL LOW (ref 13.0–17.0)
MCH: 33.7 pg (ref 26.0–34.0)
MCHC: 35 g/dL (ref 30.0–36.0)
MCV: 96.3 fL (ref 80.0–100.0)
Platelets: 288 10*3/uL (ref 150–400)
RBC: 3.77 MIL/uL — ABNORMAL LOW (ref 4.22–5.81)
RDW: 13.6 % (ref 11.5–15.5)
WBC: 12.3 10*3/uL — ABNORMAL HIGH (ref 4.0–10.5)
nRBC: 0 % (ref 0.0–0.2)

## 2020-02-27 LAB — BASIC METABOLIC PANEL
Anion gap: 12 (ref 5–15)
BUN: 11 mg/dL (ref 6–20)
CO2: 30 mmol/L (ref 22–32)
Calcium: 9.1 mg/dL (ref 8.9–10.3)
Chloride: 90 mmol/L — ABNORMAL LOW (ref 98–111)
Creatinine, Ser: 0.75 mg/dL (ref 0.61–1.24)
GFR calc Af Amer: >60 mL/min (ref >60)
GFR calc non Af Amer: >60 mL/min (ref >60)
Glucose, Bld: 128 mg/dL — ABNORMAL HIGH (ref 70–99)
Potassium: 4.2 mmol/L (ref 3.5–5.1)
Sodium: 132 mmol/L — ABNORMAL LOW (ref 135–145)

## 2020-02-27 LAB — HIV ANTIBODY (ROUTINE TESTING W REFLEX): HIV Screen 4th Generation wRfx: NONREACTIVE

## 2020-02-27 LAB — GLUCOSE, CAPILLARY
Glucose-Capillary: 93 mg/dL (ref 70–99)
Glucose-Capillary: 99 mg/dL (ref 70–99)
Glucose-Capillary: 188 mg/dL — ABNORMAL HIGH (ref 70–99)
Glucose-Capillary: 143 mg/dL — ABNORMAL HIGH (ref 70–99)

## 2020-02-27 MED ORDER — COLLAGENASE 250 UNIT/GM EX OINT
TOPICAL_OINTMENT | Freq: Every day | CUTANEOUS | Status: DC
  Filled 2020-02-27: qty 30

## 2020-02-27 MED ORDER — IPRATROPIUM-ALBUTEROL 0.5-2.5 (3) MG/3ML IN SOLN
3.0000 mL | Freq: Four times a day (QID) | RESPIRATORY_TRACT | Status: DC
  Administered 2020-02-27 – 2020-02-28 (×3): 3 mL via RESPIRATORY_TRACT
  Filled 2020-02-27 (×3): qty 3

## 2020-02-27 NOTE — Consult Note (Signed)
WOC Nurse Consult Note: Reason for Consult:Chronic nonhealing wounds to lower right leg.   Wound type:vascular Pressure Injury POA: NA Measurement: Distal right lower leg: 3 cm linear dry abrasions  Scabbed and dry Proximal right lower leg  2 cm x 1 cm Scabbed dry wounds to left lower arm slough to bed Wound bed:100% devitalized tissue Drainage (amount, consistency, odor) leg wound purulent no odor.  Periwound:dry skin Dressing procedure/placement/frequency: Cleanse wounds to right lower leg and arm with NS.  Apply Santyl to wound bed cover with NS moist 2x2.  Secure with dry gauze and kerlix/tape.  CHange daily.  Will not follow at this time.  Please re-consult if needed.  Maple Hudson MSN, RN, FNP-BC CWON Wound, Ostomy, Continence Nurse Pager [phone]

## 2020-02-27 NOTE — Progress Notes (Addendum)
Progress Note    [name]  KXF:[id number] DOB: [birth year]  DOA: 02/25/2020 PCP: [facility] [place]      Brief Narrative:    Medical records reviewed and are as summarized below:  [name] is a 49 y.o. male  with medical history significant for diabetes, hypertension, peripheral vascular disease status post left AKA, questionable history of past MI, followed at the [facility] who presented to the emergency room with cough productive of clear sputum, shortness of breath and wheezing not responding to home bronchodilator therapy.  He was brought in by EMS and O2 sats reportedly in the mid 80s on their arrival.    He was admitted to the hospital for acute COPD exacerbation acute hypoxemic and hypercapnic respiratory failure.  He was treated with oxygen, steroids, bronchodilators and empiric IV antibiotics.      Assessment/Plan:   Principal Problem:   COPD with acute exacerbation (HCC) Active Problems:   DM (diabetes mellitus) ([place])   HTN (hypertension)   Acute respiratory failure with hypoxia and hypercapnia (HCC)   Unilateral AKA, left (HCC)   Acute COPD exacerbation: Continue steroids, bronchodilators and empiric IV Rocephin.  Acute hypoxemic and hypercapnic respiratory failure/acute respiratory acidosis: Improving.  He is down from 4 to 3 L/min oxygen.  Respiratory acidosis has improved..  Continue oxygen via nasal cannula as needed.  Patient was informed that he may need oxygen at discharge for chronic hypoxemic respiratory failure.  Patient likely has chronic hypercapnic respiratory failure as well.  Insulin dependent diabetes mellitus: Continue Lantus.  NovoLog as needed for hyperglycemia.  CAD, PVD with left AKA, hypertension: Continue Coreg and pravastatin.  Hold isosorbide since BP is soft.  Psychiatric disorder: Continue Seroquel  Tobacco use disorder: He has been advised to stop smoking cigarettes.  Body mass index is 22.08  kg/m.   Family Communication/Anticipated D/C date and plan/Code Status   DVT prophylaxis:  rivaroxaban (XARELTO) tablet 20 mg   Code Status: Full code Family Communication: Plan discussed with patient Disposition Plan:    Status is: Inpatient  Remains inpatient appropriate because:IV treatments appropriate due to intensity of illness or inability to take PO and Inpatient level of care appropriate due to severity of illness   Dispo: The patient is from: Home              Anticipated d/c is to: Home              Anticipated d/c date is: 1 day              Patient currently is not medically stable to d/c.           Subjective:   He still has a cough and he feels short of breath with activity.  Objective:    Vitals:   02/26/20 2359 02/27/20 0441 02/27/20 0803 02/27/20 1200  BP: 113/71 127/84 134/69 129/84  Pulse: 72 67 71 67  Resp: 20 18 13 18   Temp: 98.2 F (36.8 C) 98.4 F (36.9 C) 98.2 F (36.8 C) 98.4 F (36.9 C)  TempSrc:   Oral Oral  SpO2: 100% 100% 96% 100%  Weight:      Height:       No data found.   Intake/Output Summary (Last 24 hours) at 02/27/2020 1224 Last data filed at 02/27/2020 0945 Gross per 24 hour  Intake 0 ml  Output --  Net 0 ml   Filed Weights   02/25/20 1842 02/26/20 0118  Weight: 76.2 kg 75.9 kg    Exam:  GEN: NAD SKIN: Chronic right leg wounds EYES: EOMI ENT: MMM CV: RRR PULM: Decreased air entry bilaterally, bilateral expiratory wheezing improving ABD: soft, ND, NT, +BS CNS: AAO x 3, non focal EXT: Right leg edema without tenderness.  I could not palpate dorsalis pedis or posterior tibialis pulse but the right foot is warm to touch.  Left AKA   Data Reviewed:   I have personally reviewed following labs and imaging studies:  Labs: Labs show the following:   Basic Metabolic Panel: Recent Labs  Lab 02/25/20 1929 02/27/20 0521  NA 130* 132*  K 4.7 4.2  CL 93* 90*  CO2 28 30  GLUCOSE 109* 128*  BUN 5*  11  CREATININE 0.87 0.75  CALCIUM 8.6* 9.1   GFR Estimated Creatinine Clearance: 119.9 mL/min (by C-G formula based on SCr of 0.75 mg/dL). Liver Function Tests: Recent Labs  Lab 02/25/20 1929  AST 16  ALT 8  ALKPHOS 178*  BILITOT 0.5  PROT 6.7  ALBUMIN 2.9*   No results for input(s): LIPASE, AMYLASE in the last 168 hours. No results for input(s): AMMONIA in the last 168 hours. Coagulation profile No results for input(s): INR, PROTIME in the last 168 hours.  CBC: Recent Labs  Lab 02/25/20 1849 02/27/20 0521  WBC 6.9 12.3*  NEUTROABS 3.2  --   HGB 13.2 12.7*  HCT 39.0 36.3*  MCV 100.5* 96.3  PLT 272 288   Cardiac Enzymes: No results for input(s): CKTOTAL, CKMB, CKMBINDEX, TROPONINI in the last 168 hours. BNP (last 3 results) No results for input(s): PROBNP in the last 8760 hours. CBG: Recent Labs  Lab 02/26/20 0806 02/26/20 1156 02/26/20 1629 02/26/20 2045 02/27/20 0805  GLUCAP 172* 161* 129* 107* 93   D-Dimer: No results for input(s): DDIMER in the last 72 hours. Hgb A1c: Recent Labs    02/26/20 0523  HGBA1C 6.3*   Lipid Profile: No results for input(s): CHOL, HDL, LDLCALC, TRIG, CHOLHDL, LDLDIRECT in the last 72 hours. Thyroid function studies: No results for input(s): TSH, T4TOTAL, T3FREE, THYROIDAB in the last 72 hours.  Invalid input(s): FREET3 Anemia work up: No results for input(s): VITAMINB12, FOLATE, FERRITIN, TIBC, IRON, RETICCTPCT in the last 72 hours. Sepsis Labs: Recent Labs  Lab 02/25/20 1849 02/25/20 1929 02/27/20 0521  WBC 6.9  --  12.3*  LATICACIDVEN  --  0.8  --     Microbiology Recent Results (from the past 240 hour(s))  SARS Coronavirus 2 by RT PCR (hospital order, performed in [facility] hospital lab) Nasopharyngeal Nasopharyngeal Swab     Status: None   Collection Time: 02/25/20  8:56 PM   Specimen: Nasopharyngeal Swab  Result Value Ref Range Status   SARS Coronavirus 2 NEGATIVE NEGATIVE Final    Comment:  (NOTE) SARS-CoV-2 target nucleic acids are NOT DETECTED.  The SARS-CoV-2 RNA is generally detectable in upper and lower respiratory specimens during the acute phase of infection. The lowest concentration of SARS-CoV-2 viral copies this assay can detect is 250 copies / mL. A negative result does not preclude SARS-CoV-2 infection and should not be used as the sole basis for treatment or other patient management decisions.  A negative result may occur with improper specimen collection / handling, submission of specimen other than nasopharyngeal swab, presence of viral mutation(s) within the areas targeted by this assay, and inadequate number of viral copies (<250 copies / mL). A negative result must be combined with clinical observations,  patient history, and epidemiological information.  Fact Sheet for Patients:   BoilerBrush.com.cy  Fact Sheet for Healthcare Providers: https://pope.com/  This test is not yet approved or  cleared by the [place] FDA and has been authorized for detection and/or diagnosis of SARS-CoV-2 by FDA under an Emergency Use Authorization (EUA).  This EUA will remain in effect (meaning this test can be used) for the duration of the COVID-19 declaration under Section 564(b)(1) of the Act, 21 U.S.C. section 360bbb-3(b)(1), unless the authorization is terminated or revoked sooner.  Performed at [facility], [address], [place], [place] [address]     Procedures and diagnostic studies:  DG Chest Portable 1 View  Result Date: 02/25/2020 CLINICAL DATA:  Shortness of breath. Arrived via EMS with difficulty breathing EXAM: PORTABLE CHEST 1 VIEW COMPARISON:  06/01/2018 FINDINGS: Cardiomediastinal contours and hilar structures are normal. Lungs are clear.  No signs of pleural effusion. Spinal degenerative changes. Skeletal structures on limited assessment otherwise unremarkable. IMPRESSION: No acute  cardiopulmonary disease. Electronically Signed   By: Donzetta Kohut M.D.   On: 02/25/2020 19:10    Medications:    carbamazepine  200 mg Oral BID   carvedilol  12.5 mg Oral BID   feeding supplement (ENSURE ENLIVE)  237 mL Oral TID BM   ferrous sulfate  325 mg Oral Q breakfast   gabapentin  900 mg Oral TID   insulin aspart  0-15 Units Subcutaneous TID WC   insulin aspart  0-5 Units Subcutaneous QHS   insulin glargine  10 Units Subcutaneous QHS   ipratropium-albuterol  3 mL Nebulization QID   morphine  30 mg Oral Q12H   multivitamin with minerals  1 tablet Oral Daily   pantoprazole  40 mg Oral BID   pravastatin  20 mg Oral QHS   predniSONE  40 mg Oral Q breakfast   QUEtiapine  400 mg Oral QHS   rivaroxaban  20 mg Oral QHS   Continuous Infusions:  cefTRIAXone (ROCEPHIN)  IV Stopped (02/27/20 3149)     LOS: 2 days   Jalena Vanderlinden  [facility]     02/27/2020, 12:24 PM

## 2020-02-28 LAB — GLUCOSE, CAPILLARY: Glucose-Capillary: 81 mg/dL (ref 70–99)

## 2020-02-28 MED ORDER — ENSURE ENLIVE PO LIQD: 237.0000 mL | Freq: Three times a day (TID) | ORAL | 12 refills | Status: AC

## 2020-02-28 MED ORDER — RIVAROXABAN 20 MG PO TABS: 20.0000 mg | ORAL_TABLET | Freq: Every day | ORAL | 0 refills | Status: AC

## 2020-02-28 MED ORDER — PREDNISONE 20 MG PO TABS: 40.0000 mg | ORAL_TABLET | Freq: Every day | ORAL | 0 refills | Status: AC

## 2020-02-28 NOTE — Discharge Summary (Addendum)
[facility] - [place] at [facility]   PATIENT NAME: [name]    MR#:  [id number]  DATE OF BIRTH:  [birth year]  DATE OF ADMISSION:  02/25/2020 ADMITTING PHYSICIAN: Andris Baumann, MD  DATE OF DISCHARGE: 02/28/2020  PRIMARY CARE PHYSICIAN: [facility]    ADMISSION DIAGNOSIS:  COPD exacerbation (HCC) [J44.1] Acute respiratory failure with hypoxia (HCC) [J96.01]  DISCHARGE DIAGNOSIS:  Acute on chronic hypoxic respiratory failure secondary to COPD exacerbation.  SECONDARY DIAGNOSIS:   Past Medical History:  Diagnosis Date  . Asthma   . Collagen vascular disease (HCC)   . COPD (chronic obstructive pulmonary disease) (HCC)   . Diabetes mellitus without complication (HCC)   . Gastric ulcer   . Hypertension   . Myocardial infarction (HCC)   . Renal disorder   . Thrombocytopenia [facility])     HOSPITAL COURSE:   JESIEL GARATE is a 49 y.o. male with medical history significant fordiabetes, hypertension, peripheral vascular disease status post left AKA, questionable history of past MI, followed at the [facility] who presented to the emergency room with cough productive of clear sputum, shortness of breath and wheezing. He was brought in by EMS and O2 sats reportedly in the mid 80s on their arrival.    Acute COPD exacerbation: Continue steroids, bronchodilators -cxr no pneumonia--d/c abxs  Acute hypoxemic and hypercapnic respiratory failure/acute respiratory acidosis: Improving.   - Respiratory acidosis has improved. -Continue oxygen via nasal cannula as needed--- currently patient is on room air 97 200%. Not wheezing. He feels back to baseline and keen to go home. - Patient likely has chronic hypercapnic respiratory failure as well.  Insulin dependent diabetes mellitus: Continue Lantus.  NovoLog as needed for hyperglycemia.  CAD, PVD with left AKA, hypertension: Continue Imdur,Coreg and pravastatin.   Psychiatric disorder: Continue  Seroquel  Tobacco use disorder: He has been advised to stop smoking cigarettes.  Body mass index is 22.08 kg/m.  DVT prophylaxis:  rivaroxaban (XARELTO) tablet 20 mg   Code Status: Full code Family Communication: Plan discussed with patient Disposition Plan: to home today after assessment for need of oxygen   Status is: Inpatient  Dispo: The patient is from: Home  Anticipated d/c is to: Home  Anticipated d/c date is: today  Patient currently is  medically stable to d/c. CONSULTS OBTAINED:    DRUG ALLERGIES:   Allergies  Allergen Reactions  . Demerol [Meperidine] Anaphylaxis  . Arithmin [Antazoline]   . Erythromycin   . Zosyn [Piperacillin Sod-Tazobactam So]     DISCHARGE MEDICATIONS:   Allergies as of 02/28/2020      Reactions   Demerol [meperidine] Anaphylaxis   Arithmin [antazoline]    Erythromycin    Zosyn [piperacillin Sod-tazobactam So]       Medication List    STOP taking these medications   amoxicillin-clavulanate 875-125 MG tablet Commonly known as: Augmentin   cholecalciferol 1000 units tablet Commonly known as: VITAMIN D     TAKE these medications   acetaminophen 500 MG tablet Commonly known as: TYLENOL Take 1,000 mg by mouth every 6 (six) hours as needed for mild pain.   carvedilol 12.5 MG tablet Commonly known as: COREG Take 1 tablet by mouth 2 (two) times daily.   collagenase ointment Commonly known as: Santyl Apply 1 application topically 2 (two) times daily.   feeding supplement (ENSURE ENLIVE) Liqd Take 237 mLs by mouth 3 (three) times daily between meals.   ferrous sulfate 325 (65 FE) MG EC  tablet Take 325 mg by mouth daily with breakfast.   gabapentin 300 MG capsule Commonly known as: NEURONTIN Take 3 capsules by mouth 3 (three) times daily.   insulin aspart 100 UNIT/ML injection Commonly known as: novoLOG Inject 50 Units into the skin See admin instructions. Per sliding scale    insulin glargine 100 UNIT/ML injection Commonly known as: LANTUS Inject 10 Units into the skin at bedtime.   isosorbide mononitrate 30 MG 24 hr tablet Commonly known as: IMDUR Take 1 tablet by mouth 2 (two) times daily.   morphine 30 MG 12 hr tablet Commonly known as: MS CONTIN Take 30 mg by mouth every 12 (twelve) hours.   multivitamin with minerals tablet Take 1 tablet by mouth daily.   nitroGLYCERIN 0.4 MG SL tablet Commonly known as: NITROSTAT Place 0.4 mg under the tongue. Place 0.4 mg under the tongue as needed for chest pain.   oxycodone 5 MG capsule Commonly known as: OXY-IR Take 1 capsule (5 mg total) by mouth every 4 (four) hours as needed. What changed:   how much to take  when to take this   pantoprazole 40 MG tablet Commonly known as: PROTONIX Take 40 mg by mouth 2 (two) times daily.   pravastatin 20 MG tablet Commonly known as: PRAVACHOL Take 1 tablet by mouth at bedtime.   predniSONE 20 MG tablet Commonly known as: DELTASONE Take 2 tablets (40 mg total) by mouth daily with breakfast for 4 days.   ProAir HFA 108 (90 Base) MCG/ACT inhaler Generic drug: albuterol Inhale 2 puffs into the lungs every 4 (four) hours as needed for wheezing or shortness of breath.   rivaroxaban 20 MG Tabs tablet Commonly known as: XARELTO Take 1 tablet (20 mg total) by mouth at bedtime. What changed: how much to take   SEROquel 400 MG tablet Generic drug: QUEtiapine Take 1 tablet by mouth at bedtime.   TEGretol 200 MG tablet Generic drug: carbamazepine Take 1 tablet by mouth 2 (two) times daily.            Discharge Care Instructions  (From admission, onward)         Start     Ordered   02/28/20 0000  Discharge wound care:       Comments: Cleanse wounds to right lower leg and arm with NS.  Apply Santyl to wound bed cover with NS moist 2x2.  Secure with dry gauze and kerlix/tape.  CHange daily   02/28/20 0802          If you experience worsening of  your admission symptoms, develop shortness of breath, life threatening emergency, suicidal or homicidal thoughts you must seek medical attention immediately by calling 911 or calling your MD immediately  if symptoms less severe.  You Must read complete instructions/literature along with all the possible adverse reactions/side effects for all the Medicines you take and that have been prescribed to you. Take any new Medicines after you have completely understood and accept all the possible adverse reactions/side effects.   Please note  You were cared for by a hospitalist during your hospital stay. If you have any questions about your discharge medications or the care you received while you were in the hospital after you are discharged, you can call the unit and asked to speak with the hospitalist on call if the hospitalist that took care of you is not available. Once you are discharged, your primary care physician will handle any further medical issues. Please note that NO  REFILLS for any discharge medications will be authorized once you are discharged, as it is imperative that you return to your primary care physician (or establish a relationship with a primary care physician if you do not have one) for your aftercare needs so that they can reassess your need for medications and monitor your lab values. Today   SUBJECTIVE   Feels better--wants to go home  VITAL SIGNS:  Blood pressure (!) 136/95, pulse 78, temperature 98.4 F (36.9 C), temperature source Oral, resp. rate 18, height 6\' 1"  (1.854 m), weight 75.9 kg, SpO2 97 %.  I/O:    Intake/Output Summary (Last 24 hours) at 02/28/2020 1017 Last data filed at 02/27/2020 1917 Gross per 24 hour  Intake 0 ml  Output 500 ml  Net -500 ml    PHYSICAL EXAMINATION:  GENERAL:  49 y.o.-year-old patient lying in the bed with no acute distress.  EYES: Pupils equal, round, reactive to light and accommodation. No scleral icterus.  HEENT: Head atraumatic,  normocephalic. Oropharynx and nasopharynx clear.  NECK:  Supple, no jugular venous distention. No thyroid enlargement, no tenderness.  LUNGS: Normal breath sounds bilaterally, no wheezing, rales,rhonchi or crepitation. No use of accessory muscles of respiration.  CARDIOVASCULAR: S1, S2 normal. No murmurs, rubs, or gallops.  ABDOMEN: Soft, non-tender, non-distended. Bowel sounds present. No organomegaly or mass.  EXTREMITIES: No pedal edema, cyanosis, or clubbing.  NEUROLOGIC: Cranial nerves II through XII are intact. Muscle strength 5/5 in all extremities. Sensation intact. Gait not checked.  PSYCHIATRIC: The patient is alert and oriented x 3.  SKIN:   Distal right lower leg: 3 cm linear dry abrasions  Scabbed and dry Proximal right lower leg  2 cm x 1 cm Scabbed dry wounds to left lower arm slough to bed Wound bed:100% devitalized tissue Drainage (amount, consistency, odor) leg wound purulent no odor.  Periwound:dry skin POA   DATA REVIEW:   CBC  Recent Labs  Lab 02/27/20 0521  WBC 12.3*  HGB 12.7*  HCT 36.3*  PLT 288    Chemistries  Recent Labs  Lab 02/25/20 1929 02/25/20 1929 02/27/20 0521  NA 130*   < > 132*  K 4.7   < > 4.2  CL 93*   < > 90*  CO2 28   < > 30  GLUCOSE 109*   < > 128*  BUN 5*   < > 11  CREATININE 0.87   < > 0.75  CALCIUM 8.6*   < > 9.1  AST 16  --   --   ALT 8  --   --   ALKPHOS 178*  --   --   BILITOT 0.5  --   --    < > = values in this interval not displayed.    Microbiology Results   Recent Results (from the past 240 hour(s))  SARS Coronavirus 2 by RT PCR (hospital order, performed in [facility] hospital lab) Nasopharyngeal Nasopharyngeal Swab     Status: None   Collection Time: 02/25/20  8:56 PM   Specimen: Nasopharyngeal Swab  Result Value Ref Range Status   SARS Coronavirus 2 NEGATIVE NEGATIVE Final    Comment: (NOTE) SARS-CoV-2 target nucleic acids are NOT DETECTED.  The SARS-CoV-2 RNA is generally detectable in upper and  lower respiratory specimens during the acute phase of infection. The lowest concentration of SARS-CoV-2 viral copies this assay can detect is 250 copies / mL. A negative result does not preclude SARS-CoV-2 infection and should not be used  as the sole basis for treatment or other patient management decisions.  A negative result may occur with improper specimen collection / handling, submission of specimen other than nasopharyngeal swab, presence of viral mutation(s) within the areas targeted by this assay, and inadequate number of viral copies (<250 copies / mL). A negative result must be combined with clinical observations, patient history, and epidemiological information.  Fact Sheet for Patients:   BoilerBrush.com.cy  Fact Sheet for Healthcare Providers: https://pope.com/  This test is not yet approved or  cleared by the [place] FDA and has been authorized for detection and/or diagnosis of SARS-CoV-2 by FDA under an Emergency Use Authorization (EUA).  This EUA will remain in effect (meaning this test can be used) for the duration of the COVID-19 declaration under Section 564(b)(1) of the Act, 21 U.S.C. section 360bbb-3(b)(1), unless the authorization is terminated or revoked sooner.  Performed at [facility], [address], [place], [place] [address]     RADIOLOGY:  No results found.   CODE STATUS:     Code Status Orders  (From admission, onward)         Start     Ordered   02/25/20 2144  Full code  Continuous        02/25/20 2148        Code Status History    Date Active Date Inactive Code Status Order ID Comments User Context   06/02/2018 0115 06/03/2018 1314 Full Code [id number]  Oralia Manis, MD Inpatient   05/08/2015 0817 05/08/2015 1948 Full Code [id number]  Crissie Figures, MD Inpatient   Advance Care Planning Activity    Advance Directive Documentation     Most Recent Value  Type of  Advance Directive Healthcare Power of Attorney, Living will  Pre-existing out of facility DNR order (yellow form or pink MOST form) --  "MOST" Form in Place? --       TOTAL TIME TAKING CARE OF THIS PATIENT: *40 minutes.    Enedina Finner M.D  [facility]    CC: Primary care physician; [facility]

## 2020-02-28 NOTE — TOC Transition Note (Signed)
Transition of Care [facility]) - CM/SW Discharge Note   Patient Details  Name: ALYSSA MANCERA MRN: [id number] Date of Birth: 1970/10/31  Transition of Care [facility]) CM/SW Contact:  Lucy Chris, LCSW Phone Number: 02/28/2020, 10:04 AM   Clinical Narrative: Pt ready to discharge home today. Will be followed by [facility] for HHPT and OT for strengthening. No equipment needs has from previous admits.  O2 levels back to his baseline and he does not want home O2. Encouraged him to stop smoking. Wife to transport him home once DC paperwork completed. No further follow due to DC today.    Final next level of care: Home w Home Health Services Barriers to Discharge: Barriers Resolved   Patient Goals and CMS Choice   CMS Medicare.gov Compare Post Acute Care list provided to:: Patient Choice offered to / list presented to : Patient  Discharge Placement                Patient to be transferred to facility by: HOme via car by wife Name of family member notified: Robin Patient and family notified of of transfer: 02/28/20  Discharge Plan and Services                          HH Arranged: PT, OT HH Agency: [facility] Date HH Agency Contacted: 02/28/20 Time HH Agency Contacted: 1003 Representative spoke with at [facility] Agency: Barbara Cower  Social Determinants of Health (SDOH) Interventions     Readmission Risk Interventions No flowsheet data found.

## 2020-02-28 NOTE — Care Management Important Message (Signed)
Important Message  Patient Details  Name: [name] MRN: [id number] Date of Birth: Aug 09, 1971   Medicare Important Message Given:  N/A - LOS <3 / Initial given by admissions     Olegario Messier A Taysean Wager 02/28/2020, 10:17 AM

## 2020-02-28 NOTE — Discharge Instructions (Signed)
Patient advised to check his sugars at home and continue his sliding scale as before including his long acting insulin.

## 2020-07-14 DIAGNOSIS — I10 Essential (primary) hypertension: Secondary | ICD-10-CM | POA: Diagnosis not present

## 2020-07-14 DIAGNOSIS — J449 Chronic obstructive pulmonary disease, unspecified: Secondary | ICD-10-CM | POA: Diagnosis not present

## 2020-07-14 DIAGNOSIS — M25552 Pain in left hip: Secondary | ICD-10-CM | POA: Diagnosis not present

## 2020-07-14 DIAGNOSIS — Z041 Encounter for examination and observation following transport accident: Secondary | ICD-10-CM | POA: Diagnosis not present

## 2020-07-14 DIAGNOSIS — G8929 Other chronic pain: Secondary | ICD-10-CM | POA: Diagnosis not present

## 2020-07-14 DIAGNOSIS — M545 Low back pain, unspecified: Secondary | ICD-10-CM | POA: Diagnosis not present

## 2020-07-14 DIAGNOSIS — R69 Illness, unspecified: Secondary | ICD-10-CM | POA: Diagnosis not present

## 2020-07-14 DIAGNOSIS — E119 Type 2 diabetes mellitus without complications: Secondary | ICD-10-CM | POA: Diagnosis not present

## 2020-07-14 DIAGNOSIS — Z794 Long term (current) use of insulin: Secondary | ICD-10-CM | POA: Diagnosis not present

## 2020-07-14 DIAGNOSIS — Z89612 Acquired absence of left leg above knee: Secondary | ICD-10-CM | POA: Diagnosis not present

## 2020-07-15 DIAGNOSIS — Z041 Encounter for examination and observation following transport accident: Secondary | ICD-10-CM | POA: Diagnosis not present

## 2020-09-13 DIAGNOSIS — S52502A Unspecified fracture of the lower end of left radius, initial encounter for closed fracture: Secondary | ICD-10-CM | POA: Diagnosis not present

## 2020-09-13 DIAGNOSIS — Z043 Encounter for examination and observation following other accident: Secondary | ICD-10-CM | POA: Diagnosis not present

## 2020-09-13 DIAGNOSIS — R2232 Localized swelling, mass and lump, left upper limb: Secondary | ICD-10-CM | POA: Diagnosis not present

## 2020-09-20 ENCOUNTER — Other Ambulatory Visit: Payer: Self-pay

## 2020-09-20 ENCOUNTER — Emergency Department
Admission: EM | Admit: 2020-09-20 | Discharge: 2020-09-20 | Disposition: A | Discharge status: Home / Self Care | Payer: Medicare HMO | Attending: Emergency Medicine | Admitting: Emergency Medicine

## 2020-09-20 ENCOUNTER — Emergency Department: Payer: Medicare HMO

## 2020-09-20 ENCOUNTER — Encounter: Payer: Self-pay | Admitting: Emergency Medicine

## 2020-09-20 DIAGNOSIS — S52502A Unspecified fracture of the lower end of left radius, initial encounter for closed fracture: Secondary | ICD-10-CM | POA: Diagnosis not present

## 2020-09-20 DIAGNOSIS — W01198A Fall on same level from slipping, tripping and stumbling with subsequent striking against other object, initial encounter: Secondary | ICD-10-CM | POA: Diagnosis not present

## 2020-09-20 DIAGNOSIS — Z79899 Other long term (current) drug therapy: Secondary | ICD-10-CM | POA: Diagnosis not present

## 2020-09-20 DIAGNOSIS — S52592A Other fractures of lower end of left radius, initial encounter for closed fracture: Secondary | ICD-10-CM | POA: Diagnosis not present

## 2020-09-20 DIAGNOSIS — J45909 Unspecified asthma, uncomplicated: Secondary | ICD-10-CM | POA: Insufficient documentation

## 2020-09-20 DIAGNOSIS — E1129 Type 2 diabetes mellitus with other diabetic kidney complication: Secondary | ICD-10-CM | POA: Insufficient documentation

## 2020-09-20 DIAGNOSIS — S6992XA Unspecified injury of left wrist, hand and finger(s), initial encounter: Secondary | ICD-10-CM | POA: Diagnosis present

## 2020-09-20 DIAGNOSIS — F17210 Nicotine dependence, cigarettes, uncomplicated: Secondary | ICD-10-CM | POA: Diagnosis not present

## 2020-09-20 DIAGNOSIS — S52501A Unspecified fracture of the lower end of right radius, initial encounter for closed fracture: Secondary | ICD-10-CM | POA: Insufficient documentation

## 2020-09-20 DIAGNOSIS — J441 Chronic obstructive pulmonary disease with (acute) exacerbation: Secondary | ICD-10-CM | POA: Diagnosis not present

## 2020-09-20 DIAGNOSIS — Z794 Long term (current) use of insulin: Secondary | ICD-10-CM | POA: Diagnosis not present

## 2020-09-20 DIAGNOSIS — Z7901 Long term (current) use of anticoagulants: Secondary | ICD-10-CM | POA: Insufficient documentation

## 2020-09-20 DIAGNOSIS — I10 Essential (primary) hypertension: Secondary | ICD-10-CM | POA: Diagnosis not present

## 2020-09-20 DIAGNOSIS — Z89512 Acquired absence of left leg below knee: Secondary | ICD-10-CM | POA: Diagnosis not present

## 2020-09-20 DIAGNOSIS — S62102A Fracture of unspecified carpal bone, left wrist, initial encounter for closed fracture: Secondary | ICD-10-CM

## 2020-09-20 NOTE — ED Triage Notes (Signed)
Pt to ED via POV stating that he fell last week, pt tried to to catch himself with his left hand. Pt states that wrist has been painful and swollen since. Pt is in NAD at this time.

## 2020-09-20 NOTE — ED Triage Notes (Signed)
Fall one week ago.  Injured left wrist

## 2020-09-21 NOTE — ED Provider Notes (Signed)
[facility] Emergency Department Provider Note  ____________________________________________   Event Date/Time   First MD Initiated Contact with Patient 09/20/20 2027     (approximate)  I have reviewed the triage vital signs and the nursing notes.   HISTORY  Chief Complaint Wrist Injury  HPI [name] is a 50 y.o. male who reports to the emergency department for evaluation of left wrist pain.  The patient states he was standing on his one leg when he "forgot that he did not have the upper leg" and lost his balance, falling and catching himself directly on his left outstretched hand.  He had immediate pain in the left wrist, but thought that it was possibly sprained.  He did not seek treatment at that time.  This injury was 1 week ago.  He presents to the emergency department tonight for persisting swelling and pain in the left wrist region.  Of note, the patient has 1 leg due to a left above-the-knee amputation.  He utilizes a wheelchair to help with mobility at times.         Past Medical History:  Diagnosis Date  . Asthma   . Collagen vascular disease (HCC)   . COPD (chronic obstructive pulmonary disease) (HCC)   . Diabetes mellitus without complication (HCC)   . Gastric ulcer   . Hypertension   . Myocardial infarction (HCC)   . Renal disorder   . Thrombocytopenia [facility])     Patient Active Problem List   Diagnosis Date Noted  . COPD with acute exacerbation (HCC) 02/25/2020  . Acute respiratory failure with hypoxia and hypercapnia (HCC) 02/25/2020  . Unilateral AKA, left (HCC) 02/25/2020  . Hyponatremia 06/01/2018  . CAP (community acquired pneumonia) 06/01/2018  . GERD (gastroesophageal reflux disease) 06/01/2018  . Necrotizing fasciitis (HCC)   . Diabetic foot infection (HCC) 05/08/2015  . DM (diabetes mellitus) (HCC) 05/08/2015  . HTN (hypertension) 05/08/2015  . COPD (chronic obstructive pulmonary disease) (HCC) 05/08/2015     Past Surgical History:  Procedure Laterality Date  . CHOLECYSTECTOMY    . FOOT SURGERY Left   . left below knee amputation      Prior to Admission medications   Medication Sig Start Date End Date Taking? Authorizing Provider  acetaminophen (TYLENOL) 500 MG tablet Take 1,000 mg by mouth every 6 (six) hours as needed for mild pain.  05/09/12   [provider]  albuterol (PROAIR HFA) 108 (90 BASE) MCG/ACT inhaler Inhale 2 puffs into the lungs every 4 (four) hours as needed for wheezing or shortness of breath.  08/20/14   [provider]  carbamazepine (TEGRETOL) 200 MG tablet Take 1 tablet by mouth 2 (two) times daily.     [provider]  carvedilol (COREG) 12.5 MG tablet Take 1 tablet by mouth 2 (two) times daily.    [provider]  collagenase (SANTYL) ointment Apply 1 application topically 2 (two) times daily. Patient not taking: Reported on 02/25/2020 02/23/18   Kem Boroughs B, FNP  feeding supplement, ENSURE ENLIVE, (ENSURE ENLIVE) LIQD Take 237 mLs by mouth 3 (three) times daily between meals. 02/28/20   Enedina Finner, MD  ferrous sulfate 325 (65 FE) MG EC tablet Take 325 mg by mouth daily with breakfast.    [provider]  gabapentin (NEURONTIN) 300 MG capsule Take 3 capsules by mouth 3 (three) times daily.  03/09/15 02/25/20  [provider]  insulin aspart (NOVOLOG) 100 UNIT/ML injection Inject 50 Units into the skin  See admin instructions. Per sliding scale    [provider]  insulin glargine (LANTUS) 100 UNIT/ML injection Inject 10 Units into the skin at bedtime.    [provider]  isosorbide mononitrate (IMDUR) 30 MG 24 hr tablet Take 1 tablet by mouth 2 (two) times daily.    [provider]  morphine (MS CONTIN) 30 MG 12 hr tablet Take 30 mg by mouth every 12 (twelve) hours.    [provider]  Multiple Vitamins-Minerals (MULTIVITAMIN WITH MINERALS) tablet Take 1 tablet by mouth daily.     [provider]  nitroGLYCERIN (NITROSTAT) 0.4 MG SL tablet Place 0.4 mg under the tongue. Place 0.4 mg under the tongue as needed for chest pain.    [provider]  oxycodone (OXY-IR) 5 MG capsule Take 1 capsule (5 mg total) by mouth every 4 (four) hours as needed. Patient taking differently: Take 15 mg by mouth 3 (three) times daily.  09/26/15   Tommi Rumps, PA-C  pantoprazole (PROTONIX) 40 MG tablet Take 40 mg by mouth 2 (two) times daily. 10/30/14   [provider]  pravastatin (PRAVACHOL) 20 MG tablet Take 1 tablet by mouth at bedtime.    [provider]  QUEtiapine (SEROQUEL) 400 MG tablet Take 1 tablet by mouth at bedtime.    [provider]  rivaroxaban (XARELTO) 20 MG TABS tablet Take 1 tablet (20 mg total) by mouth at bedtime. 02/28/20   Enedina Finner, MD    Allergies Demerol [meperidine], Arithmin [antazoline], Erythromycin, and Zosyn [piperacillin sod-tazobactam so]  Family History  Adopted: Yes    Social History Social History   Tobacco Use  . Smoking status: Current Every Day Smoker    Packs/day: 1.00    Types: Cigarettes  . Smokeless tobacco: Never Used  Substance Use Topics  . Alcohol use: Yes    Comment: two 40oz beer Daily  . Drug use: No    Review of Systems Constitutional: No fever/chills Eyes: No visual changes. ENT: No sore throat. Cardiovascular: Denies chest pain. Respiratory: Denies shortness of breath. Gastrointestinal: No abdominal pain.  No nausea, no vomiting.  No diarrhea.  No constipation. Genitourinary: Negative for dysuria. Musculoskeletal: + Left wrist pain, negative for back pain. Skin: Negative for rash. Neurological: Negative for headaches, focal weakness or numbness.   ____________________________________________   PHYSICAL EXAM:  VITAL SIGNS: ED Triage Vitals  Enc Vitals Group     BP 09/20/20 1723 123/80     Pulse Rate 09/20/20 1723 97     Resp 09/20/20 1723 16     Temp  09/20/20 1723 98.7 F (37.1 C)     Temp Source 09/20/20 1723 Oral     SpO2 09/20/20 1723 96 %     Weight 09/20/20 1720 165 lb (74.8 kg)     Height 09/20/20 1720 6\' 1"  (1.854 m)     Head Circumference --      Peak Flow --      Pain Score 09/20/20 1720 8     Pain Loc --      Pain Edu? --      Excl. in GC? --    Constitutional: Alert and oriented. Well appearing and in no acute distress. Eyes: Conjunctivae are normal. PERRL. EOMI. Head: Atraumatic. Nose: No congestion/rhinnorhea. Mouth/Throat: Mucous membranes are moist.  Neck: No stridor.   Cardiovascular: Normal rate, regular rhythm. Grossly normal heart sounds.  Good peripheral circulation. Respiratory: Normal respiratory effort.  No retractions. Lungs CTAB. Musculoskeletal: There is  obvious swelling to the left wrist region.  There is tenderness diffusely, most prominent at the distal radius.  No increased tenderness of the anatomic snuffbox region.  Radial pulse 2+.  Neurovascularly intact. Neurologic:  Normal speech and language. No gross focal neurologic deficits are appreciated. No gait instability. Skin: There is mild superficial wound over the left dorsal wrist with active healing Psychiatric: Mood and affect are normal. Speech and behavior are normal.  ____________________________________________  RADIOLOGY I, Lucy Chris, personally viewed and evaluated these images (plain radiographs) as part of my medical decision making, as well as reviewing the written report by the radiologist.  ED provider interpretation: Distal radial fracture with mild angulation and impaction  Official radiology report(s): DG Wrist Complete Left  Result Date: 09/20/2020 CLINICAL DATA:  Pain and swelling status post fall EXAM: LEFT WRIST - COMPLETE 3+ VIEW COMPARISON:  None. FINDINGS: There is an acute, angulated and impacted fracture of the distal radius with surrounding soft tissue swelling. There is no dislocation. No radiopaque foreign  body. IMPRESSION: Acute, angulated and impacted fracture of the distal radius with surrounding soft tissue swelling. Electronically Signed   By: Katherine Mantle M.D.   On: 09/20/2020 18:16    ____________________________________________   INITIAL IMPRESSION / ASSESSMENT AND PLAN / ED COURSE  As part of my medical decision making, I reviewed the following data within the electronic MEDICAL RECORD NUMBER Nursing notes reviewed and incorporated and Radiograph reviewed         Patient is a 50 year old male who presents to the emergency department for evaluation of left wrist injury.  See HPI for further details.  On physical exam, the patient has diffuse swelling of the left wrist with tenderness most prominent over the distal radius.  There is no increased tenderness at the anatomic snuffbox region.  The patient does have a small superficial wound over the dorsum of the left wrist, however he states that he sustained this while scratching his skin and it was sustained after the fall which was 1 week ago.  There is no surrounding erythema or cellulitis to suggest infection related to this.  X-rays demonstrate a distal radius fracture with angulation and impaction.  The patient will be placed in a volar wrist splint and will follow up with orthopedics on outpatient basis.  The patient is already under pain management contract and will discuss possibly taking an increased number of these with his pain management doctor.  Otherwise, he will use Tylenol and/or anti-inflammatory for treatment of his symptoms.  Patient is amenable with this plan stable at the time for outpatient therapy.      ____________________________________________   FINAL CLINICAL IMPRESSION(S) / ED DIAGNOSES  Final diagnoses:  Closed fracture of left wrist, initial encounter     ED Discharge Orders    None      *Please note:  [name] was evaluated in Emergency Department on 09/21/2020 for the symptoms described  in the history of present illness. He was evaluated in the context of the global COVID-19 pandemic, which necessitated consideration that the patient might be at risk for infection with the SARS-CoV-2 virus that causes COVID-19. Institutional protocols and algorithms that pertain to the evaluation of patients at risk for COVID-19 are in a state of rapid change based on information released by regulatory bodies including the CDC and federal and state organizations. These policies and algorithms were followed during the patient's care in the ED.  Some ED evaluations and interventions may be  delayed as a result of limited staffing during and the pandemic.*   Note:  This document was prepared using Dragon voice recognition software and may include unintentional dictation errors.    Lucy ChrisRodgers, Isatou Agredano J, PA 09/21/20 Lorin Picket0012    Delton PrairieSmith, Dylan, MD 09/23/20 [phone]

## 2020-09-27 DIAGNOSIS — S52502D Unspecified fracture of the lower end of left radius, subsequent encounter for closed fracture with routine healing: Secondary | ICD-10-CM | POA: Diagnosis not present

## 2020-10-02 ENCOUNTER — Emergency Department: Payer: Medicare HMO

## 2020-10-02 ENCOUNTER — Other Ambulatory Visit: Payer: Self-pay

## 2020-10-02 ENCOUNTER — Inpatient Hospital Stay
Admission: EM | Admit: 2020-10-02 | Discharge: 2020-10-04 | DRG: 190 | Disposition: A | Discharge status: Home / Self Care | Payer: Medicare HMO | Attending: Family Medicine | Admitting: Family Medicine

## 2020-10-02 DIAGNOSIS — R0602 Shortness of breath: Secondary | ICD-10-CM | POA: Diagnosis not present

## 2020-10-02 DIAGNOSIS — Z79899 Other long term (current) drug therapy: Secondary | ICD-10-CM | POA: Diagnosis not present

## 2020-10-02 DIAGNOSIS — F4310 Post-traumatic stress disorder, unspecified: Secondary | ICD-10-CM | POA: Diagnosis present

## 2020-10-02 DIAGNOSIS — Z20822 Contact with and (suspected) exposure to covid-19: Secondary | ICD-10-CM | POA: Diagnosis not present

## 2020-10-02 DIAGNOSIS — E785 Hyperlipidemia, unspecified: Secondary | ICD-10-CM | POA: Diagnosis not present

## 2020-10-02 DIAGNOSIS — I252 Old myocardial infarction: Secondary | ICD-10-CM | POA: Diagnosis not present

## 2020-10-02 DIAGNOSIS — G8929 Other chronic pain: Secondary | ICD-10-CM | POA: Diagnosis not present

## 2020-10-02 DIAGNOSIS — K219 Gastro-esophageal reflux disease without esophagitis: Secondary | ICD-10-CM | POA: Diagnosis present

## 2020-10-02 DIAGNOSIS — Z89612 Acquired absence of left leg above knee: Secondary | ICD-10-CM

## 2020-10-02 DIAGNOSIS — Z794 Long term (current) use of insulin: Secondary | ICD-10-CM

## 2020-10-02 DIAGNOSIS — Z885 Allergy status to narcotic agent status: Secondary | ICD-10-CM

## 2020-10-02 DIAGNOSIS — Z881 Allergy status to other antibiotic agents status: Secondary | ICD-10-CM

## 2020-10-02 DIAGNOSIS — J441 Chronic obstructive pulmonary disease with (acute) exacerbation: Secondary | ICD-10-CM | POA: Diagnosis not present

## 2020-10-02 DIAGNOSIS — I2510 Atherosclerotic heart disease of native coronary artery without angina pectoris: Secondary | ICD-10-CM | POA: Diagnosis present

## 2020-10-02 DIAGNOSIS — R0902 Hypoxemia: Secondary | ICD-10-CM

## 2020-10-02 DIAGNOSIS — E1151 Type 2 diabetes mellitus with diabetic peripheral angiopathy without gangrene: Secondary | ICD-10-CM | POA: Diagnosis present

## 2020-10-02 DIAGNOSIS — J9601 Acute respiratory failure with hypoxia: Secondary | ICD-10-CM | POA: Diagnosis present

## 2020-10-02 DIAGNOSIS — I70202 Unspecified atherosclerosis of native arteries of extremities, left leg: Secondary | ICD-10-CM | POA: Diagnosis present

## 2020-10-02 DIAGNOSIS — I10 Essential (primary) hypertension: Secondary | ICD-10-CM | POA: Diagnosis present

## 2020-10-02 DIAGNOSIS — Z79891 Long term (current) use of opiate analgesic: Secondary | ICD-10-CM

## 2020-10-02 DIAGNOSIS — Z7901 Long term (current) use of anticoagulants: Secondary | ICD-10-CM | POA: Diagnosis not present

## 2020-10-02 DIAGNOSIS — Z8711 Personal history of peptic ulcer disease: Secondary | ICD-10-CM

## 2020-10-02 DIAGNOSIS — F17210 Nicotine dependence, cigarettes, uncomplicated: Secondary | ICD-10-CM | POA: Diagnosis present

## 2020-10-02 DIAGNOSIS — I998 Other disorder of circulatory system: Secondary | ICD-10-CM | POA: Diagnosis present

## 2020-10-02 LAB — CBC
HCT: 38.4 % — ABNORMAL LOW (ref 39.0–52.0)
Hemoglobin: 12.7 g/dL — ABNORMAL LOW (ref 13.0–17.0)
MCH: 34.3 pg — ABNORMAL HIGH (ref 26.0–34.0)
MCHC: 33.1 g/dL (ref 30.0–36.0)
MCV: 103.8 fL — ABNORMAL HIGH (ref 80.0–100.0)
Platelets: 338 10*3/uL (ref 150–400)
RBC: 3.7 MIL/uL — ABNORMAL LOW (ref 4.22–5.81)
RDW: 14.1 % (ref 11.5–15.5)
WBC: 8 10*3/uL (ref 4.0–10.5)
nRBC: 0 % (ref 0.0–0.2)

## 2020-10-02 LAB — BASIC METABOLIC PANEL
Anion gap: 11 (ref 5–15)
BUN: 8 mg/dL (ref 6–20)
CO2: 28 mmol/L (ref 22–32)
Calcium: 8.5 mg/dL — ABNORMAL LOW (ref 8.9–10.3)
Chloride: 99 mmol/L (ref 98–111)
Creatinine, Ser: 0.61 mg/dL (ref 0.61–1.24)
GFR, Estimated: >60 mL/min (ref >60)
Glucose, Bld: 127 mg/dL — ABNORMAL HIGH (ref 70–99)
Potassium: 4.3 mmol/L (ref 3.5–5.1)
Sodium: 138 mmol/L (ref 135–145)

## 2020-10-02 LAB — RESP PANEL BY RT-PCR (FLU A&B, COVID) ARPGX2
Influenza A by PCR: NEGATIVE
Influenza B by PCR: NEGATIVE
SARS Coronavirus 2 by RT PCR: NEGATIVE

## 2020-10-02 MED ORDER — IPRATROPIUM BROMIDE 0.02 % IN SOLN
0.5000 mg | Freq: Four times a day (QID) | RESPIRATORY_TRACT | Status: DC
  Administered 2020-10-03 (×2): 0.5 mg via RESPIRATORY_TRACT
  Filled 2020-10-02 (×2): qty 2.5

## 2020-10-02 MED ORDER — MORPHINE SULFATE ER 30 MG PO TBCR
30.0000 mg | EXTENDED_RELEASE_TABLET | Freq: Two times a day (BID) | ORAL | Status: DC
  Administered 2020-10-02 – 2020-10-03 (×2): 30 mg via ORAL
  Filled 2020-10-02 (×2): qty 1

## 2020-10-02 MED ORDER — DOXYCYCLINE HYCLATE 100 MG PO TABS
100.0000 mg | ORAL_TABLET | Freq: Two times a day (BID) | ORAL | Status: DC
  Administered 2020-10-02 – 2020-10-04 (×4): 100 mg via ORAL
  Filled 2020-10-02 (×4): qty 1

## 2020-10-02 MED ORDER — METHYLPREDNISOLONE SODIUM SUCC 40 MG IJ SOLR
40.0000 mg | Freq: Two times a day (BID) | INTRAMUSCULAR | Status: AC
Start: 2020-10-02 — End: 2020-10-03
  Administered 2020-10-02 – 2020-10-03 (×2): 40 mg via INTRAVENOUS
  Filled 2020-10-02 (×2): qty 1

## 2020-10-02 MED ORDER — OXYCODONE HCL 5 MG PO TABS: 5.0000 mg | ORAL_TABLET | Freq: Four times a day (QID) | ORAL | Status: DC | PRN

## 2020-10-02 MED ORDER — PANTOPRAZOLE SODIUM 40 MG PO TBEC
40.0000 mg | DELAYED_RELEASE_TABLET | Freq: Two times a day (BID) | ORAL | Status: DC
  Administered 2020-10-02 – 2020-10-04 (×4): 40 mg via ORAL
  Filled 2020-10-02 (×4): qty 1

## 2020-10-02 MED ORDER — SODIUM CHLORIDE 0.9% FLUSH
3.0000 mL | Freq: Two times a day (BID) | INTRAVENOUS | Status: DC
  Administered 2020-10-02 – 2020-10-04 (×4): 3 mL via INTRAVENOUS

## 2020-10-02 MED ORDER — INSULIN ASPART 100 UNIT/ML ~~LOC~~ SOLN
0.0000 [IU] | Freq: Three times a day (TID) | SUBCUTANEOUS | Status: DC
  Administered 2020-10-03: 3 [IU] via SUBCUTANEOUS
  Administered 2020-10-04: 2 [IU] via SUBCUTANEOUS
  Filled 2020-10-02 (×3): qty 1

## 2020-10-02 MED ORDER — METHYLPREDNISOLONE SODIUM SUCC 125 MG IJ SOLR
INTRAMUSCULAR | Status: AC
  Administered 2020-10-02: 125 mg via INTRAVENOUS
  Filled 2020-10-02: qty 2

## 2020-10-02 MED ORDER — RIVAROXABAN 20 MG PO TABS
20.0000 mg | ORAL_TABLET | Freq: Every day | ORAL | Status: DC
  Administered 2020-10-03 (×2): 20 mg via ORAL
  Filled 2020-10-02 (×4): qty 1

## 2020-10-02 MED ORDER — ALBUTEROL SULFATE HFA 108 (90 BASE) MCG/ACT IN AERS
2.0000 | INHALATION_SPRAY | RESPIRATORY_TRACT | Status: DC | PRN
  Filled 2020-10-02: qty 6.7

## 2020-10-02 MED ORDER — IPRATROPIUM-ALBUTEROL 0.5-2.5 (3) MG/3ML IN SOLN
3.0000 mL | Freq: Once | RESPIRATORY_TRACT | Status: AC
  Administered 2020-10-02: 3 mL via RESPIRATORY_TRACT
  Filled 2020-10-02: qty 3

## 2020-10-02 MED ORDER — IPRATROPIUM-ALBUTEROL 0.5-2.5 (3) MG/3ML IN SOLN
RESPIRATORY_TRACT | Status: AC
  Administered 2020-10-02: 3 mL via RESPIRATORY_TRACT
  Filled 2020-10-02: qty 6

## 2020-10-02 MED ORDER — METHYLPREDNISOLONE SODIUM SUCC 125 MG IJ SOLR: 125.0000 mg | Freq: Once | INTRAMUSCULAR | Status: AC

## 2020-10-02 MED ORDER — QUETIAPINE FUMARATE 200 MG PO TABS
400.0000 mg | ORAL_TABLET | Freq: Every day | ORAL | Status: DC
  Administered 2020-10-02 – 2020-10-03 (×2): 400 mg via ORAL
  Filled 2020-10-02 (×3): qty 2

## 2020-10-02 MED ORDER — CARVEDILOL 12.5 MG PO TABS
12.5000 mg | ORAL_TABLET | Freq: Two times a day (BID) | ORAL | Status: DC
  Administered 2020-10-02 – 2020-10-04 (×4): 12.5 mg via ORAL
  Filled 2020-10-02: qty 1
  Filled 2020-10-02 (×3): qty 2

## 2020-10-02 MED ORDER — ACETAMINOPHEN 500 MG PO TABS
1000.0000 mg | ORAL_TABLET | Freq: Four times a day (QID) | ORAL | Status: DC | PRN
  Administered 2020-10-04 (×2): 1000 mg via ORAL
  Filled 2020-10-02 (×2): qty 2

## 2020-10-02 MED ORDER — FLUTICASONE FUROATE-VILANTEROL 100-25 MCG/INH IN AEPB: 1.0000 | INHALATION_SPRAY | Freq: Every day | RESPIRATORY_TRACT | Status: DC

## 2020-10-02 MED ORDER — ISOSORBIDE MONONITRATE ER 30 MG PO TB24
30.0000 mg | ORAL_TABLET | Freq: Two times a day (BID) | ORAL | Status: DC
  Administered 2020-10-02 – 2020-10-04 (×4): 30 mg via ORAL
  Filled 2020-10-02 (×4): qty 1

## 2020-10-02 MED ORDER — INSULIN ASPART 100 UNIT/ML ~~LOC~~ SOLN
4.0000 [IU] | Freq: Three times a day (TID) | SUBCUTANEOUS | Status: DC
  Administered 2020-10-03 – 2020-10-04 (×5): 4 [IU] via SUBCUTANEOUS
  Filled 2020-10-02 (×5): qty 1

## 2020-10-02 MED ORDER — INSULIN GLARGINE 100 UNIT/ML ~~LOC~~ SOLN
10.0000 [IU] | Freq: Every day | SUBCUTANEOUS | Status: DC
  Administered 2020-10-03 (×2): 10 [IU] via SUBCUTANEOUS
  Filled 2020-10-02 (×3): qty 0.1

## 2020-10-02 MED ORDER — PRAVASTATIN SODIUM 20 MG PO TABS
20.0000 mg | ORAL_TABLET | Freq: Every day | ORAL | Status: DC
  Administered 2020-10-03: 20 mg via ORAL
  Filled 2020-10-02 (×2): qty 1

## 2020-10-02 MED ORDER — OXYCODONE HCL ER 15 MG PO T12A
15.0000 mg | EXTENDED_RELEASE_TABLET | Freq: Two times a day (BID) | ORAL | Status: DC
  Administered 2020-10-02: 15 mg via ORAL
  Filled 2020-10-02 (×4): qty 1

## 2020-10-02 MED ORDER — GABAPENTIN 300 MG PO CAPS
900.0000 mg | ORAL_CAPSULE | Freq: Three times a day (TID) | ORAL | Status: DC
  Administered 2020-10-02 – 2020-10-04 (×6): 900 mg via ORAL
  Filled 2020-10-02 (×6): qty 3

## 2020-10-02 MED ORDER — IPRATROPIUM-ALBUTEROL 0.5-2.5 (3) MG/3ML IN SOLN
3.0000 mL | Freq: Once | RESPIRATORY_TRACT | Status: AC
  Administered 2020-10-02: 3 mL via RESPIRATORY_TRACT

## 2020-10-02 MED ORDER — IPRATROPIUM-ALBUTEROL 0.5-2.5 (3) MG/3ML IN SOLN: 3.0000 mL | Freq: Once | RESPIRATORY_TRACT | Status: AC

## 2020-10-02 MED ORDER — IPRATROPIUM-ALBUTEROL 0.5-2.5 (3) MG/3ML IN SOLN
3.0000 mL | Freq: Four times a day (QID) | RESPIRATORY_TRACT | Status: DC
  Administered 2020-10-03 – 2020-10-04 (×5): 3 mL via RESPIRATORY_TRACT
  Filled 2020-10-02 (×5): qty 3

## 2020-10-02 MED ORDER — PREDNISONE 20 MG PO TABS
40.0000 mg | ORAL_TABLET | Freq: Every day | ORAL | Status: DC
  Administered 2020-10-04: 40 mg via ORAL
  Filled 2020-10-02: qty 2

## 2020-10-02 MED ORDER — NITROGLYCERIN 0.4 MG SL SUBL: 0.4000 mg | SUBLINGUAL_TABLET | SUBLINGUAL | Status: DC | PRN

## 2020-10-02 MED ORDER — ONDANSETRON HCL 4 MG/2ML IJ SOLN: 4.0000 mg | Freq: Four times a day (QID) | INTRAMUSCULAR | Status: DC | PRN

## 2020-10-02 MED ORDER — DIVALPROEX SODIUM ER 250 MG PO TB24
250.0000 mg | ORAL_TABLET | Freq: Two times a day (BID) | ORAL | Status: DC
  Administered 2020-10-02 – 2020-10-04 (×4): 250 mg via ORAL
  Filled 2020-10-02 (×6): qty 1

## 2020-10-02 NOTE — ED Notes (Signed)
Admitting provider at bedside.

## 2020-10-02 NOTE — ED Triage Notes (Signed)
Patient reports shortness of breath for 1 hour PTA. Patient reports hx of copd. Denies home oxygen use. Per Ems patient oxygen 83% on RA. Patient placed on 4L [place] in route to hospital. Patient oxygen 93% on arrival to ER. Patient A&OX3.

## 2020-10-02 NOTE — ED Notes (Signed)
IV attempted with [place], no access obtained.

## 2020-10-02 NOTE — ED Notes (Signed)
Dr. Malinda at bedside.  

## 2020-10-02 NOTE — ED Provider Notes (Signed)
[facility] Emergency Department Provider Note   ____________________________________________   Event Date/Time   First MD Initiated Contact with Patient 10/02/20 1421     (approximate)  I have reviewed the triage vital signs and the nursing notes.   HISTORY  Chief Complaint Shortness of Breath    HPI [name] is a 50 y.o. male with a stated past medical history of COPD and continued tobacco abuse who presents via EMS for shortness of breath.  Patient was found by EMS to have oxygen saturation 83% on room air that improved on 4 L nasal cannula.  Patient was given a DuoNeb in route with stated improvement in his symptoms but states that his chest still feels "tight".  Patient on 2 L oxygen nasal cannula during my evaluation with adequate oxygenation and speaking in full sentences.  Patient currently denies any vision changes, tinnitus, difficulty speaking, facial droop, sore throat, abdominal pain, nausea/vomiting/diarrhea, dysuria, or weakness/numbness/paresthesias in any extremity         Past Medical History:  Diagnosis Date  . Asthma   . Collagen vascular disease (HCC)   . COPD (chronic obstructive pulmonary disease) (HCC)   . Diabetes mellitus without complication (HCC)   . Gastric ulcer   . Hypertension   . Myocardial infarction (HCC)   . Renal disorder   . Thrombocytopenia [facility])     Patient Active Problem List   Diagnosis Date Noted  . COPD with acute exacerbation (HCC) 02/25/2020  . Acute respiratory failure with hypoxia and hypercapnia (HCC) 02/25/2020  . Unilateral AKA, left (HCC) 02/25/2020  . Hyponatremia 06/01/2018  . CAP (community acquired pneumonia) 06/01/2018  . GERD (gastroesophageal reflux disease) 06/01/2018  . Necrotizing fasciitis (HCC)   . Diabetic foot infection (HCC) 05/08/2015  . DM (diabetes mellitus) (HCC) 05/08/2015  . HTN (hypertension) 05/08/2015  . COPD (chronic obstructive pulmonary disease) (HCC)  05/08/2015    Past Surgical History:  Procedure Laterality Date  . CHOLECYSTECTOMY    . FOOT SURGERY Left   . left below knee amputation      Prior to Admission medications   Medication Sig Start Date End Date Taking? Authorizing Provider  acetaminophen (TYLENOL) 500 MG tablet Take 1,000 mg by mouth every 6 (six) hours as needed for mild pain.  05/09/12   [provider]  albuterol (PROAIR HFA) 108 (90 BASE) MCG/ACT inhaler Inhale 2 puffs into the lungs every 4 (four) hours as needed for wheezing or shortness of breath.  08/20/14   [provider]  carbamazepine (TEGRETOL) 200 MG tablet Take 1 tablet by mouth 2 (two) times daily.     [provider]  carvedilol (COREG) 12.5 MG tablet Take 1 tablet by mouth 2 (two) times daily.    [provider]  collagenase (SANTYL) ointment Apply 1 application topically 2 (two) times daily. Patient not taking: Reported on 02/25/2020 02/23/18   Kem Boroughs B, FNP  feeding supplement, ENSURE ENLIVE, (ENSURE ENLIVE) LIQD Take 237 mLs by mouth 3 (three) times daily between meals. 02/28/20   Enedina Finner, MD  ferrous sulfate 325 (65 FE) MG EC tablet Take 325 mg by mouth daily with breakfast.    [provider]  gabapentin (NEURONTIN) 300 MG capsule Take 3 capsules by mouth 3 (three) times daily.  03/09/15 02/25/20  [provider]  insulin aspart (NOVOLOG) 100 UNIT/ML injection Inject 50 Units into the skin See admin instructions. Per sliding scale    [provider]  insulin  glargine (LANTUS) 100 UNIT/ML injection Inject 10 Units into the skin at bedtime.    [provider]  isosorbide mononitrate (IMDUR) 30 MG 24 hr tablet Take 1 tablet by mouth 2 (two) times daily.    [provider]  morphine (MS CONTIN) 30 MG 12 hr tablet Take 30 mg by mouth every 12 (twelve) hours.    [provider]  Multiple Vitamins-Minerals (MULTIVITAMIN WITH MINERALS) tablet Take 1 tablet by mouth  daily.    [provider]  nitroGLYCERIN (NITROSTAT) 0.4 MG SL tablet Place 0.4 mg under the tongue. Place 0.4 mg under the tongue as needed for chest pain.    [provider]  oxycodone (OXY-IR) 5 MG capsule Take 1 capsule (5 mg total) by mouth every 4 (four) hours as needed. Patient taking differently: Take 15 mg by mouth 3 (three) times daily.  09/26/15   Tommi Rumps, PA-C  pantoprazole (PROTONIX) 40 MG tablet Take 40 mg by mouth 2 (two) times daily. 10/30/14   [provider]  pravastatin (PRAVACHOL) 20 MG tablet Take 1 tablet by mouth at bedtime.    [provider]  QUEtiapine (SEROQUEL) 400 MG tablet Take 1 tablet by mouth at bedtime.    [provider]  rivaroxaban (XARELTO) 20 MG TABS tablet Take 1 tablet (20 mg total) by mouth at bedtime. 02/28/20   Enedina Finner, MD    Allergies Demerol [meperidine], Arithmin [antazoline], Erythromycin, and Zosyn [piperacillin sod-tazobactam so]  Family History  Adopted: Yes    Social History Social History   Tobacco Use  . Smoking status: Current Every Day Smoker    Packs/day: 1.00    Types: Cigarettes  . Smokeless tobacco: Never Used  Substance Use Topics  . Alcohol use: Yes    Comment: two 40oz beer Daily  . Drug use: No    Review of Systems Constitutional: No fever/chills Eyes: No visual changes. ENT: No sore throat. Cardiovascular: Endorses chest pain. Respiratory: Endorses shortness of breath. Gastrointestinal: No abdominal pain.  No nausea, no vomiting.  No diarrhea. Genitourinary: Negative for dysuria. Musculoskeletal: Negative for acute arthralgias Skin: Negative for rash. Neurological: Negative for headaches, weakness/numbness/paresthesias in any extremity Psychiatric: Negative for suicidal ideation/homicidal ideation   ____________________________________________   PHYSICAL EXAM:  VITAL SIGNS: ED Triage Vitals  Enc Vitals Group     BP 10/02/20 1426 108/65      Pulse Rate 10/02/20 1426 (!) 116     Resp 10/02/20 1426 20     Temp 10/02/20 1426 98.6 F (37 C)     Temp Source 10/02/20 1426 Oral     SpO2 10/02/20 1426 92 %     Weight 10/02/20 1428 163 lb 2.3 oz (74 kg)     Height 10/02/20 1428 6\' 1"  (1.854 m)     Head Circumference --      Peak Flow --      Pain Score 10/02/20 1428 0     Pain Loc --      Pain Edu? --      Excl. in GC? --    Constitutional: Alert and oriented. Well appearing and in no acute distress. Eyes: Conjunctivae are normal. PERRL. Head: Atraumatic. Nose: No congestion/rhinnorhea. Mouth/Throat: Mucous membranes are moist. Neck: No stridor Cardiovascular: Grossly normal heart sounds.  Good peripheral circulation. Respiratory: 2 L nasal cannula in place.  Inspiratory and expiratory wheezes over bilateral lung fields Gastrointestinal: Soft and nontender. No distention. Musculoskeletal: No obvious deformities Neurologic:  Normal speech and language.  No gross focal neurologic deficits are appreciated. Skin:  Skin is warm and dry. No rash noted. Psychiatric: Mood and affect are normal. Speech and behavior are normal.  ____________________________________________   LABS (all labs ordered are listed, but only abnormal results are displayed)  Labs Reviewed  RESP PANEL BY RT-PCR (FLU A&B, COVID) ARPGX2  BASIC METABOLIC PANEL  CBC   ____________________________________________  EKG  ED ECG REPORT I, Merwyn Katos, the attending physician, personally viewed and interpreted this ECG.  Date: 10/02/2020 EKG Time: 1805 Rate: 69 Rhythm: normal sinus rhythm QRS Axis: normal Intervals: normal ST/T Wave abnormalities: normal Narrative Interpretation: no evidence of acute ischemia  ____________________________________________  RADIOLOGY  ED MD interpretation: 2 view x-ray of the chest shows no evidence of acute abnormalities including no pneumonia, pneumothorax, or widened mediastinum  Official radiology  report(s): DG Chest 2 View  Result Date: 10/02/2020 CLINICAL DATA:  Shortness of breath. EXAM: CHEST - 2 VIEW COMPARISON:  February 25, 2020 FINDINGS: The heart size and mediastinal contours are within normal limits. Both lungs are clear. A chronic compression fracture deformity is seen at the level of T11. IMPRESSION: No active cardiopulmonary disease. Electronically Signed   By: Aram Candela M.D.   On: 10/02/2020 15:01    ____________________________________________   PROCEDURES  Procedure(s) performed (including Critical Care):  .1-3 Lead EKG Interpretation Performed by: Merwyn Katos, MD Authorized by: Merwyn Katos, MD     Interpretation: abnormal     ECG rate:  115   ECG rate assessment: tachycardic     Rhythm: sinus tachycardia     Ectopy: none     Conduction: normal       ____________________________________________   INITIAL IMPRESSION / ASSESSMENT AND PLAN / ED COURSE  As part of my medical decision making, I reviewed the following data within the electronic MEDICAL RECORD NUMBER Nursing notes reviewed and incorporated, Labs reviewed, EKG interpreted, Old chart reviewed, Radiograph reviewed and Notes from prior ED visits reviewed and incorporated        The patient appears to be suffering from a moderate exacerbation of COPD.  Based on the history, exam, CXR/EKG, and further workup I dont suspect any other emergent cause of this presentation, such as pneumonia, acute coronary syndrome, congestive heart failure, pulmonary embolism, or pneumothorax.  ED Interventions: bronchodilators, steroids, antibiotics, reassess  Care of this patient will be signed out to the oncoming physician at the end of my shift.  All pertinent patient information conveyed and all questions answered.  All further care and disposition decisions will be made by the oncoming physician.      ____________________________________________   FINAL CLINICAL IMPRESSION(S) / ED  DIAGNOSES  Final diagnoses:  COPD exacerbation (HCC)  SOB (shortness of breath)     ED Discharge Orders    None       Note:  This document was prepared using Dragon voice recognition software and may include unintentional dictation errors.   Merwyn Katos, MD 10/02/20 [phone]

## 2020-10-02 NOTE — ED Provider Notes (Addendum)
-----------------------------------------   6:06 PM on 10/02/2020 -----------------------------------------  Patient still very tight.  O2 is fluctuating between 88 and 92.  We will give him a another DuoNeb which will make 3 duo nebs here with Solu-Medrol and 1 other neb in the EMS unit.  We will see how he does after that.   Arnaldo Natal, MD 10/02/20 1806  Patient has had another DuoNeb to make 3 here along with the Solu-Medrol and the neb with EMS making a total of 4 nebs completely.  He is sitting in bed comfortably with an O2 saturation of 88 on room air.  We will have to get him in the hospital   Arnaldo Natal, MD 10/02/20 1906    Arnaldo Natal, MD 10/02/20 [id number]

## 2020-10-02 NOTE — ED Notes (Signed)
Patient provided with urinal.

## 2020-10-02 NOTE — ED Notes (Signed)
Patient reports he is feeling better after breathing treatments, patient informed he is going to be admitted to hospital. Patient requests lights to be dimmed and that he is going to try to sleep. Patient A&Ox3.

## 2020-10-02 NOTE — ED Notes (Signed)
Patient transported to X-ray 

## 2020-10-02 NOTE — H&P (Signed)
History and Physical    [name]:[id number] DOB: 1971-02-09 DOA: 10/02/2020  PCP: [facility]  Patient coming from: home  I have personally briefly reviewed patient's old medical records in [facility]  Chief Complaint: sob 3-4 days acute worse in last 24 hours  HPI: [name] is a 50 y.o. male with medical history significant of Asthma, COPD ,DMII, PUD, HLD, CAD s/p MI, Collagen vascular disease, peripheral vascular disease status post left AKA, PTSD/bipolar who presents to ed with sob 3-4 days acute worse in last 24 hours. He also note associated increase wheezing, and cough that is productive of clear sputum. He notes no chest pain and notes no pleurisy,n/v/d/abdominal pain/sorethroat, fatigue or muscle aches associated with his symptoms. He denies sick contacts. He notes he is fully vaccinated. Per chart "  Patient was found by EMS to have oxygen saturation 83% on room air that improved on 4 L nasal cannula.  Patient was given a DuoNeb in route with stated improvement in his symptoms but states that his chest still feels "tight "   ED Course:   98.6, bp 108/65, rr 116, r 20 sat 92% on 2L desat in ed 87% on 2L s/p  Improved to 94% with neb and increase O2 Labs: wbc 8, hb 12.7, mcv 103.8 Na 138, K 4.3 , glu 127, cr 0.61 Cxr:NAD Respiratory panel: neg Tx, solumedrol, neb x 4 Review of Systems: As per HPI otherwise 10 point review of systems negative.   Past Medical History:  Diagnosis Date  . Asthma   . Collagen vascular disease (HCC)   . COPD (chronic obstructive pulmonary disease) (HCC)   . Diabetes mellitus without complication (HCC)   . Gastric ulcer   . Hypertension   . Myocardial infarction (HCC)   . Renal disorder   . Thrombocytopenia (HCC)     Past Surgical History:  Procedure Laterality Date  . CHOLECYSTECTOMY    . FOOT SURGERY Left   . left below knee amputation       reports that he has been smoking cigarettes. He has  been smoking about 1.00 pack per day. He has never used smokeless tobacco. He reports current alcohol use. He reports that he does not use drugs.  Allergies  Allergen Reactions  . Demerol [Meperidine] Anaphylaxis  . Arithmin [Antazoline]   . Erythromycin   . Piperacillin-Tazobactam In Dex     SOB  . Zosyn [Piperacillin Sod-Tazobactam So]     Family History  Adopted: Yes    Prior to Admission medications   Medication Sig Start Date End Date Taking? Authorizing Provider  carvedilol (COREG) 12.5 MG tablet Take 12.5 mg by mouth 2 (two) times daily.   Yes [provider]  divalproex (DEPAKOTE ER) 250 MG 24 hr tablet Take 250 mg by mouth 2 (two) times daily.   Yes [provider]  gabapentin (NEURONTIN) 300 MG capsule Take 3 capsules by mouth 3 (three) times daily.  03/09/15 10/02/20 Yes [provider]  isosorbide mononitrate (IMDUR) 30 MG 24 hr tablet Take 30 mg by mouth 2 (two) times daily.   Yes [provider]  morphine (MS CONTIN) 30 MG 12 hr tablet Take 30 mg by mouth every 12 (twelve) hours.   Yes [provider]  Multiple Vitamins-Minerals (MULTIVITAMIN WITH MINERALS) tablet Take 1 tablet by mouth daily.   Yes [provider]  oxycodone (OXY-IR) 5 MG capsule Take 1 capsule (5 mg total) by mouth every  4 (four) hours as needed. Patient taking differently: Take 15 mg by mouth 3 (three) times daily. 09/26/15  Yes Bridget HartshornSummers, Rhonda L, PA-C  pantoprazole (PROTONIX) 40 MG tablet Take 40 mg by mouth 2 (two) times daily. 10/30/14  Yes [provider]  QUEtiapine (SEROQUEL) 400 MG tablet Take 400 mg by mouth at bedtime.   Yes [provider]  rivaroxaban (XARELTO) 20 MG TABS tablet Take 1 tablet (20 mg total) by mouth at bedtime. 02/28/20  Yes Enedina FinnerPatel, Sona, MD  traZODone (DESYREL) 50 MG tablet Take 50 mg by mouth at bedtime as needed for sleep.   Yes [provider]  acetaminophen (TYLENOL) 500 MG tablet Take 1,000 mg by  mouth every 6 (six) hours as needed for mild pain.  05/09/12   [provider]  albuterol (VENTOLIN HFA) 108 (90 Base) MCG/ACT inhaler Inhale 2 puffs into the lungs every 4 (four) hours as needed for wheezing or shortness of breath.  08/20/14   [provider]  carbamazepine (TEGRETOL) 200 MG tablet Take 200 mg by mouth 2 (two) times daily. Patient not taking: Reported on 10/02/2020    [provider]  collagenase (SANTYL) ointment Apply 1 application topically 2 (two) times daily. Patient not taking: No sig reported 02/23/18   Triplett, Rulon Eisenmengerari B, FNP  feeding supplement, ENSURE ENLIVE, (ENSURE ENLIVE) LIQD Take 237 mLs by mouth 3 (three) times daily between meals. 02/28/20   Enedina FinnerPatel, Sona, MD  ferrous sulfate 325 (65 FE) MG EC tablet Take 325 mg by mouth daily with breakfast. Patient not taking: No sig reported    [provider]  nitroGLYCERIN (NITROSTAT) 0.4 MG SL tablet Place 0.4 mg under the tongue. Place 0.4 mg under the tongue as needed for chest pain.    [provider]  pravastatin (PRAVACHOL) 20 MG tablet Take 20 mg by mouth at bedtime. Patient not taking: No sig reported    [provider]    Physical Exam: Vitals:   10/02/20 1900 10/02/20 1930 10/02/20 2000 10/02/20 2100  BP: 114/73 103/69 113/75 103/69  Pulse: (!) 105 (!) 107 (!) 102 (!) 108  Resp: 12 14 11 15   Temp:      TempSrc:      SpO2: (!) 89% 92% 90% 92%  Weight:      Height:        Constitutional: NAD, calm, comfortable Vitals:   10/02/20 1900 10/02/20 1930 10/02/20 2000 10/02/20 2100  BP: 114/73 103/69 113/75 103/69  Pulse: (!) 105 (!) 107 (!) 102 (!) 108  Resp: 12 14 11 15   Temp:      TempSrc:      SpO2: (!) 89% 92% 90% 92%  Weight:      Height:       Eyes: PERRL, lids and conjunctivae normal ENMT: Mucous membranes are moist. Posterior pharynx clear of any exudate or lesions.Normal dentition.  Neck: normal, supple, no masses, no thyromegaly Respiratory: +  expiratory wheezing, no crackles. Normal respiratory effort. No accessory muscle use.  Cardiovascular: Regular rate and rhythm, no murmurs / rubs / gallops. No extremity edema. + pedal pulses. No carotid bruits.  Abdomen: no tenderness, no masses palpated. No hepatosplenomegaly. Bowel sounds positive.  Musculoskeletal: no clubbing / cyanosis, left aka, extremities. Good ROM, no contractures. Normal muscle tone.  Skin: no rashes, lesions, ulcers. No induration Neurologic: CN 2-12 grossly intact. Sensation intact,. Strength 5/5 in all 4.  Psychiatric: Normal judgment and insight. Alert and oriented x 3. Normal mood.  Labs on Admission: I have personally reviewed following labs and imaging studies  CBC: Recent Labs  Lab 10/02/20 1429  WBC 8.0  HGB 12.7*  HCT 38.4*  MCV 103.8*  PLT 338   Basic Metabolic Panel: Recent Labs  Lab 10/02/20 1429  NA 138  K 4.3  CL 99  CO2 28  GLUCOSE 127*  BUN 8  CREATININE 0.61  CALCIUM 8.5*   GFR: Estimated Creatinine Clearance: 116.9 mL/min (by C-G formula based on SCr of 0.61 mg/dL). Liver Function Tests: No results for input(s): AST, ALT, ALKPHOS, BILITOT, PROT, ALBUMIN in the last 168 hours. No results for input(s): LIPASE, AMYLASE in the last 168 hours. No results for input(s): AMMONIA in the last 168 hours. Coagulation Profile: No results for input(s): INR, PROTIME in the last 168 hours. Cardiac Enzymes: No results for input(s): CKTOTAL, CKMB, CKMBINDEX, TROPONINI in the last 168 hours. BNP (last 3 results) No results for input(s): PROBNP in the last 8760 hours. HbA1C: No results for input(s): HGBA1C in the last 72 hours. CBG: No results for input(s): GLUCAP in the last 168 hours. Lipid Profile: No results for input(s): CHOL, HDL, LDLCALC, TRIG, CHOLHDL, LDLDIRECT in the last 72 hours. Thyroid Function Tests: No results for input(s): TSH, T4TOTAL, FREET4, T3FREE, THYROIDAB in the last 72 hours. Anemia Panel: No results for  input(s): VITAMINB12, FOLATE, FERRITIN, TIBC, IRON, RETICCTPCT in the last 72 hours. Urine analysis:    Component Value Date/Time   COLORURINE YELLOW (A) 11/11/2015 1230   APPEARANCEUR CLEAR (A) 11/11/2015 1230   APPEARANCEUR Clear 09/05/2014 0349   LABSPEC 1.025 11/11/2015 1230   LABSPEC 1.012 09/05/2014 0349   PHURINE 9.0 (H) 11/11/2015 1230   GLUCOSEU NEGATIVE 11/11/2015 1230   GLUCOSEU Negative 09/05/2014 0349   HGBUR NEGATIVE 11/11/2015 1230   BILIRUBINUR NEGATIVE 11/11/2015 1230   BILIRUBINUR Negative 09/05/2014 0349   KETONESUR 1+ (A) 11/11/2015 1230   PROTEINUR 100 (A) 11/11/2015 1230   UROBILINOGEN 0.2 04/01/2011 0321   NITRITE NEGATIVE 11/11/2015 1230   LEUKOCYTESUR NEGATIVE 11/11/2015 1230   LEUKOCYTESUR Negative 09/05/2014 0349    Radiological Exams on Admission: DG Chest 2 View  Result Date: 10/02/2020 CLINICAL DATA:  Shortness of breath. EXAM: CHEST - 2 VIEW COMPARISON:  February 25, 2020 FINDINGS: The heart size and mediastinal contours are within normal limits. Both lungs are clear. A chronic compression fracture deformity is seen at the level of T11. IMPRESSION: No active cardiopulmonary disease. Electronically Signed   By: Aram Candela M.D.   On: 10/02/2020 15:01    EKG: Independently reviewed. Sinus tachycardia , no hyperacute st-t wave changes  Assessment/Plan  Acute COPD exacerbation with acute hypoxic respiratory failure  -cxr negative  -solumedrol iv bid, azithromycin, nebs prn and standing  -f/u on sputum sample -check abg   DMII -is/fs  -check a1c  -resume home lantus  Chronic pain -due to CLBP/Pain related to [place] -resume home regimen once medication rec has been completed  Psych d/o  NOS  -per patient bipolar/ptsd  -resume psych regimen once med rec has been completed   HTN -carvediolol/imdur   HLD  -continue statin   CAD s/p  MI -remote hx  - no current cardiac complaints  -resume home cardiac medications as able   (imdure,nitro prn, xarelto,carvedilol)  GERD -continue ppi  Tobacco abuse  -encourage cessation  -further nursing education   DVT prophylaxis: lovenox  Code Status: Full Family Communication:  No family at bedside  Disposition Plan: patient  expected to be  admitted greater than 2 midnights Consults called:n/a  Admission status: inpatient /med/surg  Lurline Del MD [facility] If 7PM-7AM, please contact night-coverage [email or web address] Password [facility]  10/02/2020, 9:48 PM

## 2020-10-03 LAB — CBC WITH DIFFERENTIAL/PLATELET
Abs Immature Granulocytes: 0.02 10*3/uL (ref 0.00–0.07)
Basophils Absolute: 0 10*3/uL (ref 0.0–0.1)
Basophils Relative: 0 %
Eosinophils Absolute: 0 10*3/uL (ref 0.0–0.5)
Eosinophils Relative: 0 %
HCT: 39.8 % (ref 39.0–52.0)
Hemoglobin: 13.2 g/dL (ref 13.0–17.0)
Immature Granulocytes: 0 %
Lymphocytes Relative: 13 %
Lymphs Abs: 0.8 10*3/uL (ref 0.7–4.0)
MCH: 33.8 pg (ref 26.0–34.0)
MCHC: 33.2 g/dL (ref 30.0–36.0)
MCV: 101.8 fL — ABNORMAL HIGH (ref 80.0–100.0)
Monocytes Absolute: 0.1 10*3/uL (ref 0.1–1.0)
Monocytes Relative: 2 %
Neutro Abs: 5.2 10*3/uL (ref 1.7–7.7)
Neutrophils Relative %: 85 %
Platelets: 388 10*3/uL (ref 150–400)
RBC: 3.91 MIL/uL — ABNORMAL LOW (ref 4.22–5.81)
RDW: 14.2 % (ref 11.5–15.5)
WBC: 6.2 10*3/uL (ref 4.0–10.5)
nRBC: 0 % (ref 0.0–0.2)

## 2020-10-03 LAB — CBG MONITORING, ED
Glucose-Capillary: 177 mg/dL — ABNORMAL HIGH (ref 70–99)
Glucose-Capillary: 115 mg/dL — ABNORMAL HIGH (ref 70–99)
Glucose-Capillary: 108 mg/dL — ABNORMAL HIGH (ref 70–99)
Glucose-Capillary: 171 mg/dL — ABNORMAL HIGH (ref 70–99)
Glucose-Capillary: 170 mg/dL — ABNORMAL HIGH (ref 70–99)
Glucose-Capillary: 119 mg/dL — ABNORMAL HIGH (ref 70–99)

## 2020-10-03 LAB — HEMOGLOBIN A1C
Hgb A1c MFr Bld: 6 % — ABNORMAL HIGH (ref 4.8–5.6)
Mean Plasma Glucose: 125.5 mg/dL

## 2020-10-03 LAB — COMPREHENSIVE METABOLIC PANEL
ALT: <5 U/L (ref 0–44)
AST: 22 U/L (ref 15–41)
Albumin: 2.1 g/dL — ABNORMAL LOW (ref 3.5–5.0)
Alkaline Phosphatase: 231 U/L — ABNORMAL HIGH (ref 38–126)
Anion gap: 8 (ref 5–15)
BUN: 8 mg/dL (ref 6–20)
CO2: 33 mmol/L — ABNORMAL HIGH (ref 22–32)
Calcium: 8.7 mg/dL — ABNORMAL LOW (ref 8.9–10.3)
Chloride: 95 mmol/L — ABNORMAL LOW (ref 98–111)
Creatinine, Ser: 0.7 mg/dL (ref 0.61–1.24)
GFR, Estimated: >60 mL/min (ref >60)
Glucose, Bld: 182 mg/dL — ABNORMAL HIGH (ref 70–99)
Potassium: 5 mmol/L (ref 3.5–5.1)
Sodium: 136 mmol/L (ref 135–145)
Total Bilirubin: 0.9 mg/dL (ref 0.3–1.2)
Total Protein: 7 g/dL (ref 6.5–8.1)

## 2020-10-03 LAB — HIV ANTIBODY (ROUTINE TESTING W REFLEX): HIV Screen 4th Generation wRfx: NONREACTIVE

## 2020-10-03 MED ORDER — MORPHINE SULFATE ER 30 MG PO TBCR
30.0000 mg | EXTENDED_RELEASE_TABLET | Freq: Four times a day (QID) | ORAL | Status: DC
Start: 2020-10-03 — End: 2020-10-04
  Administered 2020-10-03 – 2020-10-04 (×3): 30 mg via ORAL
  Filled 2020-10-03 (×4): qty 1

## 2020-10-03 MED ORDER — OXYCODONE HCL 5 MG PO TABS
15.0000 mg | ORAL_TABLET | Freq: Three times a day (TID) | ORAL | Status: DC | PRN
  Administered 2020-10-03 – 2020-10-04 (×4): 15 mg via ORAL
  Filled 2020-10-03 (×5): qty 3

## 2020-10-03 NOTE — Evaluation (Signed)
Occupational Therapy Evaluation Patient Details Name: [name] MRN: [id number] DOB: [birth year] Today's Date: 10/03/2020    History of Present Illness 50 y.o. male with a stated past medical history of COPD and continued tobacco abuse who presents via EMS for shortness of breath.  Patient was found by EMS to have oxygen saturation 83% on room air that improved on 4 L nasal cannula.  Patient was given a DuoNeb in route with stated improvement in his symptoms but states that his chest still feels "tight".   Clinical Impression   [name] reports feeling much more comfortable with his breathing at present, although supine in bed his O2 sats range from 86-89 on 3L O2 via [place], and drop to low 80s with exertion. Pt reports 6/10 pain at his amputation site, which he states is "normal" for him. He is A&O x 4, pleasant, states that he feels near his baseline and is eager to return home. There he is IND in ADL, fxl mobility, and IADL, with occasional assist from his wife for that latter. Pt uses WC for ambulation. No further OT is indicated at this time.    Follow Up Recommendations  No OT follow up    Equipment Recommendations       Recommendations for Other Services       Precautions / Restrictions Precautions Precautions: None Restrictions Weight Bearing Restrictions: No      Mobility Bed Mobility Overal bed mobility: Independent                  Transfers Overall transfer level: Independent                    Balance Overall balance assessment: Independent;History of Falls                                         ADL either performed or assessed with clinical judgement   ADL Overall ADL's : Modified independent                                             Vision Patient Visual Report: No change from baseline       Perception     Praxis      Pertinent Vitals/Pain Pain Assessment: 0-10 Pain Score: 6  Pain  Location: L wrist, LLE stump Pain Descriptors / Indicators: Aching Pain Intervention(s): Patient requesting pain meds-RN notified     Hand Dominance Right   Extremity/Trunk Assessment Upper Extremity Assessment Upper Extremity Assessment: Overall WFL for tasks assessed   Lower Extremity Assessment Lower Extremity Assessment: Overall WFL for tasks assessed       Communication Communication Communication: No difficulties   Cognition Arousal/Alertness: Awake/alert Behavior During Therapy: WFL for tasks assessed/performed Overall Cognitive Status: Within Functional Limits for tasks assessed                                     General Comments  Reports having a number of falls, particularly nights when he gets up from bed to go to bathroom    Exercises Other Exercises Other Exercises: Bed mobility, transfers. Educ POC, D/C recs, falls prevention, pain mgmt   Shoulder Instructions  Home Living Family/patient expects to be discharged to:: Private residence Living Arrangements: Spouse/significant other Available Help at Discharge: Family;Available 24 hours/day Type of Home: House Home Access: Ramped entrance     Home Layout: One level     Bathroom Shower/Tub: Chief Strategy Officer: Standard Bathroom Accessibility: No   Home Equipment: Shower seat;Grab bars - tub/shower;Grab bars - toilet;Wheelchair - [employer] - 2 wheels   Additional Comments: Pt cannot enter bathroom with WC, instead uses grab bars      Prior Functioning/Environment Level of Independence: Independent with assistive device(s)        Comments: Pt uses w/c for mobility - reports bathroom is too small for w/c access so pt uses multiple grab bars + counter to hop in bathroom and tub t/f. Independent BADLs c assist from wife for IADLs. Pt reports potential possibility for LLE prosthetic         OT Problem List: Decreased activity tolerance;Cardiopulmonary status  limiting activity      OT Treatment/Interventions:      OT Goals(Current goals can be found in the care plan section) Acute Rehab OT Goals Patient Stated Goal: get breathing better and go home OT Goal Formulation: With patient Time For Goal Achievement: 10/03/20 Potential to Achieve Goals: Good  OT Frequency:     Barriers to D/C:            Co-evaluation              AM-PAC OT "6 Clicks" Daily Activity     Outcome Measure Help from another person eating meals?: None Help from another person taking care of personal grooming?: None Help from another person toileting, which includes using toliet, bedpan, or urinal?: None Help from another person bathing (including washing, rinsing, drying)?: None Help from another person to put on and taking off regular upper body clothing?: None Help from another person to put on and taking off regular lower body clothing?: None 6 Click Score: 24   End of Session Nurse Communication: Patient requests pain meds  Activity Tolerance: Patient tolerated treatment well Patient left: in bed;with call bell/phone within reach  OT Visit Diagnosis: Unsteadiness on feet (R26.81);History of falling (Z91.81);Other (comment) (dyspnea, COPD exacerbation)                Time: 2229-7989 OT Time Calculation (min): 26 min Charges:  OT General Charges $OT Visit: 1 Visit OT Treatments $Self Care/Home Management : 23-37 mins  Latina Craver, PhD, MS, OTR/L ascom [phone] 10/03/20, 1:44 PM

## 2020-10-03 NOTE — Progress Notes (Signed)
[facility] PROGRESS NOTE    [name]  UYQ:[id number] DOB: 04/21/1971 DOA: 10/02/2020 PCP: [facility]      Brief Narrative:  [name] is a 50 y.o. M with chronic pain on high doses opiates, DM, collagen vascular disease, PVD status post left AKA, and PTSD with bipolar who presented with few days of shortness of breath and wheezing as well as cough productive of clear sputum.  In the ER chest x-ray clear, hypoxic to 87%, improved with supplemental oxygen.      Assessment & Plan:  Acute hypoxic respiratory failure due to COPD exacerbation Patient presented with dyspnea and hypoxia to 87% due to COPD. - Continue steroids - Continue bronchodilators - Continue doxycycline - Continue pantoprazole    Diabetes Glucoses normal - Continue Lantus - Continue sliding scale corrections    Hypertension Disease, secondary prevention Pressure controlled - Continue carvedilol, Imdur - Continue Xarelto, pravastatin  PTSD -Continue Depakote, quetiapine  Chronic pain -Continue gabapentin, MS Contin, as needed oxycodone      Disposition: Status is: Inpatient  Remains inpatient appropriate because:still requires supplemental O2   Dispo: The patient is from: Home              Anticipated d/c is to: Home              Anticipated d/c date is: 1 day              Patient currently is not medically stable to d/c.              MDM: The below labs and imaging reports were reviewed and summarized above.  Medication management as above.     DVT prophylaxis:  rivaroxaban (XARELTO) tablet 20 mg  Code Status: FULL Family Communication: called wife, no answer, left VM           Subjective: Still coughing and SOB. No confusion, no fever.  No chest pain.  No vomiting.  Objective: Vitals:   10/03/20 1337 10/03/20 1400 10/03/20 1430 10/03/20 1700  BP:  126/88 129/78 120/81  Pulse:  73 89 86  Resp:  19 12 (!) 22   Temp:      TempSrc:      SpO2: (!) 89% 93%  95%  Weight:      Height:       No intake or output data in the 24 hours ending 10/03/20 1757 Filed Weights   10/02/20 1428  Weight: 74 kg    Examination: General appearance:  adult male, alert and in no obvious distress.  Appears out of breath HEENT: Anicteric, conjunctiva pink, lids and lashes normal. No nasal deformity, discharge, epistaxis.  Lips moist.   Skin: Warm and dry.  no jaundice.  No suspicious rashes or lesions. Cardiac: RRR, nl S1-S2, no murmurs appreciated.  Capillary refill is brisk.  JVP normal No LE edema.  Radial pulses 2+ and symmetric. Respiratory: Appears out of breath, pursed lip breathing, wheezing bilaterally. Abdomen: Abdomen soft.  No TTP or guarding. No ascites, distension, hepatosplenomegaly.   MSK: No deformities or effusions.  Left AKA. Neuro: Awake and alert.  EOMI, moves all extremities. Speech fluent.    Psych: Sensorium intact and responding to questions, attention normal. Affect normal.  Judgment and insight appear normal.    Data Reviewed: I have personally reviewed following labs and imaging studies:  CBC: Recent Labs  Lab 10/02/20 1429 10/03/20 0832  WBC 8.0 6.2  NEUTROABS  --  5.2  HGB 12.7* 13.2  HCT 38.4* 39.8  MCV 103.8* 101.8*  PLT 338 388   Basic Metabolic Panel: Recent Labs  Lab 10/02/20 1429 10/03/20 0832  NA 138 136  K 4.3 5.0  CL 99 95*  CO2 28 33*  GLUCOSE 127* 182*  BUN 8 8  CREATININE 0.61 0.70  CALCIUM 8.5* 8.7*   GFR: Estimated Creatinine Clearance: 116.9 mL/min (by C-G formula based on SCr of 0.7 mg/dL). Liver Function Tests: Recent Labs  Lab 10/03/20 0832  AST 22  ALT <5  ALKPHOS 231*  BILITOT 0.9  PROT 7.0  ALBUMIN 2.1*   No results for input(s): LIPASE, AMYLASE in the last 168 hours. No results for input(s): AMMONIA in the last 168 hours. Coagulation Profile: No results for input(s): INR, PROTIME in the last 168 hours. Cardiac Enzymes: No  results for input(s): CKTOTAL, CKMB, CKMBINDEX, TROPONINI in the last 168 hours. BNP (last 3 results) No results for input(s): PROBNP in the last 8760 hours. HbA1C: Recent Labs    10/03/20 0832  HGBA1C 6.0*   CBG: Recent Labs  Lab 10/03/20 0740 10/03/20 1206 10/03/20 1419 10/03/20 1718  GLUCAP 177* 115* 108* 171*   Lipid Profile: No results for input(s): CHOL, HDL, LDLCALC, TRIG, CHOLHDL, LDLDIRECT in the last 72 hours. Thyroid Function Tests: No results for input(s): TSH, T4TOTAL, FREET4, T3FREE, THYROIDAB in the last 72 hours. Anemia Panel: No results for input(s): VITAMINB12, FOLATE, FERRITIN, TIBC, IRON, RETICCTPCT in the last 72 hours. Urine analysis:    Component Value Date/Time   COLORURINE YELLOW (A) 11/11/2015 1230   APPEARANCEUR CLEAR (A) 11/11/2015 1230   APPEARANCEUR Clear 09/05/2014 0349   LABSPEC 1.025 11/11/2015 1230   LABSPEC 1.012 09/05/2014 0349   PHURINE 9.0 (H) 11/11/2015 1230   GLUCOSEU NEGATIVE 11/11/2015 1230   GLUCOSEU Negative 09/05/2014 0349   HGBUR NEGATIVE 11/11/2015 1230   BILIRUBINUR NEGATIVE 11/11/2015 1230   BILIRUBINUR Negative 09/05/2014 0349   KETONESUR 1+ (A) 11/11/2015 1230   PROTEINUR 100 (A) 11/11/2015 1230   UROBILINOGEN 0.2 04/01/2011 0321   NITRITE NEGATIVE 11/11/2015 1230   LEUKOCYTESUR NEGATIVE 11/11/2015 1230   LEUKOCYTESUR Negative 09/05/2014 0349   Sepsis Labs: @LABRCNTIP (procalcitonin:4,lacticacidven:4)  ) Recent Results (from the past 240 hour(s))  Resp Panel by RT-PCR (Flu A&B, Covid) Nasopharyngeal Swab     Status: None   Collection Time: 10/02/20  2:57 PM   Specimen: Nasopharyngeal Swab; Nasopharyngeal(NP) swabs in vial transport medium  Result Value Ref Range Status   SARS Coronavirus 2 by RT PCR NEGATIVE NEGATIVE Final    Comment: (NOTE) SARS-CoV-2 target nucleic acids are NOT DETECTED.  The SARS-CoV-2 RNA is generally detectable in upper respiratory specimens during the acute phase of infection. The  lowest concentration of SARS-CoV-2 viral copies this assay can detect is 138 copies/mL. A negative result does not preclude SARS-Cov-2 infection and should not be used as the sole basis for treatment or other patient management decisions. A negative result may occur with  improper specimen collection/handling, submission of specimen other than nasopharyngeal swab, presence of viral mutation(s) within the areas targeted by this assay, and inadequate number of viral copies(<138 copies/mL). A negative result must be combined with clinical observations, patient history, and epidemiological information. The expected result is Negative.  Fact Sheet for Patients:  10/04/20  Fact Sheet for Healthcare Providers:  BloggerCourse.com  This test is no t yet approved or cleared by the [place] FDA and  has been authorized for detection and/or diagnosis of SARS-CoV-2 by  FDA under an Emergency Use Authorization (EUA). This EUA will remain  in effect (meaning this test can be used) for the duration of the COVID-19 declaration under Section 564(b)(1) of the Act, 21 U.S.C.section 360bbb-3(b)(1), unless the authorization is terminated  or revoked sooner.       Influenza A by PCR NEGATIVE NEGATIVE Final   Influenza B by PCR NEGATIVE NEGATIVE Final    Comment: (NOTE) The Xpert Xpress SARS-CoV-2/FLU/RSV plus assay is intended as an aid in the diagnosis of influenza from Nasopharyngeal swab specimens and should not be used as a sole basis for treatment. Nasal washings and aspirates are unacceptable for Xpert Xpress SARS-CoV-2/FLU/RSV testing.  Fact Sheet for Patients: BloggerCourse.com  Fact Sheet for Healthcare Providers: SeriousBroker.it  This test is not yet approved or cleared by the [place] FDA and has been authorized for detection and/or diagnosis of SARS-CoV-2 by FDA under  an Emergency Use Authorization (EUA). This EUA will remain in effect (meaning this test can be used) for the duration of the COVID-19 declaration under Section 564(b)(1) of the Act, 21 U.S.C. section 360bbb-3(b)(1), unless the authorization is terminated or revoked.  Performed at [facility], [address], [place], [place] [address]          Radiology Studies: DG Chest 2 View  Result Date: 10/02/2020 CLINICAL DATA:  Shortness of breath. EXAM: CHEST - 2 VIEW COMPARISON:  February 25, 2020 FINDINGS: The heart size and mediastinal contours are within normal limits. Both lungs are clear. A chronic compression fracture deformity is seen at the level of T11. IMPRESSION: No active cardiopulmonary disease. Electronically Signed   By: Aram Candela M.D.   On: 10/02/2020 15:01        Scheduled Meds: . carvedilol  12.5 mg Oral BID  . divalproex  250 mg Oral BID  . doxycycline  100 mg Oral Q12H  . gabapentin  900 mg Oral TID  . insulin aspart  0-15 Units Subcutaneous TID WC  . insulin aspart  4 Units Subcutaneous TID WC  . insulin glargine  10 Units Subcutaneous QHS  . ipratropium-albuterol  3 mL Nebulization Q6H  . isosorbide mononitrate  30 mg Oral BID  . morphine  30 mg Oral Q6H  . pantoprazole  40 mg Oral BID  . pravastatin  20 mg Oral q1800  . [START ON 10/04/2020] predniSONE  40 mg Oral Q breakfast  . QUEtiapine  400 mg Oral QHS  . rivaroxaban  20 mg Oral Q supper  . sodium chloride flush  3 mL Intravenous Q12H   Continuous Infusions:   LOS: 1 day    Time spent: 35 minutes    Alberteen Sam, MD [facility] 10/03/2020, 5:57 PM     Please page though AMION or Epic secure chat:  For [employer], Higher education careers adviser

## 2020-10-03 NOTE — ED Notes (Signed)
Lunch tray given. 

## 2020-10-03 NOTE — ED Notes (Signed)
Chaplain in with patient per request.

## 2020-10-03 NOTE — ED Notes (Signed)
Pt requesting to speak with chaplain, secretary notified to page chaplain

## 2020-10-03 NOTE — Progress Notes (Signed)
   10/03/20 1330  Clinical Encounter Type  Visited With Patient  Visit Type Initial  Referral From Chaplain  Consult/Referral To Chaplain  Advance Directives (For Healthcare)  Does Patient Have a Medical Advance Directive? Yes  Does patient want to make changes to medical advance directive? Yes (Inpatient - patient requests chaplain consult to change a medical advance directive)   Chaplain Aggie Cosier referred me to Mr. Beals. He would like to complete an Advance Directive once he is placed in a room. I told him to have the nurse place an order requisition.   Chaplain Trudie Buckler, [place]

## 2020-10-03 NOTE — ED Notes (Addendum)
Pt was attempting to transfer from bed to chair with this RN assist. Pt did not wait for RN assist before attempting to move, RN was on the other side of the bed setting up for transfer, and pt moved to bench suddenly. Pt fell to floor and fell on buttocks. Pt denies pain or injury, no injury noted, MD and charge nurse notified. VSS. bg rechecked, bg 170

## 2020-10-03 NOTE — Evaluation (Signed)
Physical Therapy Evaluation Patient Details Name: [name] MRN: [id number] DOB: [birth year] Today's Date: 10/03/2020   History of Present Illness  50 y.o. male with a stated past medical history of COPD and continued tobacco abuse who presents via EMS for shortness of breath.  Patient was found by EMS to have oxygen saturation 83% on room air that improved on 4 L nasal cannula.  Patient was given a DuoNeb in route with stated improvement in his symptoms but states that his chest still feels "tight".  Clinical Impression  Pt laying in bed on arrival, on 3 L with sats in the mid 90s.  Trial on room air and he was able to maintain sats in the 90s during bed mobility and strength testing.  During transfers, however, he was quick to fatigue and O2 did drop to mid 80s.  Reapplied O2, nurse in room administering meds and monitoring.  [facility] but unsure if/when that will happen - he is reliant on w/c for all mobility but is able to tolerate standing activity as counters, etc.  Pt feels he is close to his baseline, agrees that he does not need further PT just needs to get breathing better.    Follow Up Recommendations No PT follow up    Equipment Recommendations  None recommended by PT    Recommendations for Other Services       Precautions / Restrictions Precautions Precautions: None Restrictions Weight Bearing Restrictions: No      Mobility  Bed Mobility Overal bed mobility: Independent             General bed mobility comments: easily gets to sitting EOB, no drop in BP with transition (baseline hypotension)    Transfers Overall transfer level: Independent               General transfer comment: Pt able to easily transfer bed to chair and back (up hill) to bed simulating w/c transfer.  Pt did so safely and confidently  Ambulation/Gait                Stairs            Wheelchair Mobility    Modified  Rankin (Stroke Patients Only)       Balance Overall balance assessment: Independent                                           Pertinent Vitals/Pain Pain Assessment: 0-10 Pain Score: 4  Pain Location: L wrist (in cast 2/2 fracture ~2 weeks ago)    Home Living Family/patient expects to be discharged to:: Private residence Living Arrangements: Spouse/significant other Available Help at Discharge: Family;Available 24 hours/day Type of Home: House Home Access: Ramped entrance     Home Layout: One level Home Equipment: Shower seat;Grab bars - tub/shower;Grab bars - toilet;Wheelchair - [employer] - 2 wheels      Prior Function Level of Independence: Independent with assistive device(s)         Comments: Pt uses w/c for mobility - reports bathroom is too small for w/c access so pt uses multiple grab bars + counter to hop in bathroom and tub t/f. Independent BADLs c assist from wife for IADLs. Pt reports potential possibility for LLE prosthetic      Hand Dominance   Dominant Hand: Right  Extremity/Trunk Assessment   Upper Extremity Assessment Upper Extremity Assessment: Overall WFL for tasks assessed (L wrist cast)    Lower Extremity Assessment Lower Extremity Assessment: Overall WFL for tasks assessed (L LE AKA functional in available range)       Communication   Communication: No difficulties  Cognition Arousal/Alertness: Awake/alert Behavior During Therapy: WFL for tasks assessed/performed Overall Cognitive Status: Within Functional Limits for tasks assessed                                        General Comments      Exercises     Assessment/Plan    PT Assessment Patent does not need any further PT services  PT Problem List Decreased strength;Decreased activity tolerance;Decreased mobility;Decreased safety awareness       PT Treatment Interventions      PT Goals (Current goals can be found in the Care Plan  section)  Acute Rehab PT Goals Patient Stated Goal: get breathing better and go home PT Goal Formulation: With patient Time For Goal Achievement: 10/17/20 Potential to Achieve Goals: Good    Frequency     Barriers to discharge        Co-evaluation               AM-PAC PT "6 Clicks" Mobility  Outcome Measure Help needed turning from your back to your side while in a flat bed without using bedrails?: None Help needed moving from lying on your back to sitting on the side of a flat bed without using bedrails?: None Help needed moving to and from a bed to a chair (including a wheelchair)?: None Help needed standing up from a chair using your arms (e.g., wheelchair or bedside chair)?: None Help needed to walk in hospital room?: Total Help needed climbing 3-5 steps with a railing? : Total 6 Click Score: 18    End of Session Equipment Utilized During Treatment: Oxygen (2-3L, performed mobility on room air) Activity Tolerance: Patient limited by fatigue;Patient tolerated treatment well Patient left: in bed;with call bell/phone within reach;with nursing/sitter in room Nurse Communication: Mobility status (O2 status with activity) PT Visit Diagnosis: Muscle weakness (generalized) (M62.81);Difficulty in walking, not elsewhere classified (R26.2)    Time: 5329-9242 PT Time Calculation (min) (ACUTE ONLY): 22 min   Charges:   PT Evaluation $PT Eval Low Complexity: 1 Low          Malachi Pro, DPT 10/03/2020, 9:25 AM

## 2020-10-03 NOTE — Progress Notes (Signed)
Responded to call from unit, doctor wanted patient to speak with neutral staff ( according to patient). Patient has a lot of anxiety about a previous stay here at [facility]. Patient is former Hotel manager, an Teacher, English as a foreign language. He is also an amputee. He is very well versed and knowledgeable about his health. I asked specific questions after listening to his concerns. I wanted to know if he was more comfortable this time at [facility], he states he is. He was unconscious the previous time and was not able to ask questions and be informed of his treatment. He mentioned his wife, who was in charge of his care if he cannot speak for himself. I inquired about an Advanced Directive, I checked patient's chart, one is not on file. Patient has been in ED since, 1/19, moved from ED 12, to current room. Waiting for a bed on unit. Once move Chaplain's office is requesting an Order Requisition be made for an Advanced Directive, if not someone will follow up to facilitate it being done.

## 2020-10-03 NOTE — ED Notes (Signed)
Message sent to pharmacy for missing med 

## 2020-10-03 NOTE — ED Notes (Signed)
Pt resting in bed with c/o pain. Med given per request. Bed locked and low, call light in reach.

## 2020-10-04 ENCOUNTER — Encounter: Payer: Self-pay | Admitting: Internal Medicine

## 2020-10-04 LAB — GLUCOSE, CAPILLARY
Glucose-Capillary: 125 mg/dL — ABNORMAL HIGH (ref 70–99)
Glucose-Capillary: 41 mg/dL — CL (ref 70–99)
Glucose-Capillary: 50 mg/dL — ABNORMAL LOW (ref 70–99)

## 2020-10-04 MED ORDER — PREDNISONE 20 MG PO TABS: 40.0000 mg | ORAL_TABLET | Freq: Every day | ORAL | 0 refills | Status: AC

## 2020-10-04 MED ORDER — ALBUTEROL SULFATE (2.5 MG/3ML) 0.083% IN NEBU: 2.5000 mg | INHALATION_SOLUTION | RESPIRATORY_TRACT | 2 refills | Status: AC | PRN

## 2020-10-04 MED ORDER — NICOTINE 21 MG/24HR TD PT24: 21.0000 mg | MEDICATED_PATCH | TRANSDERMAL | 1 refills | Status: AC

## 2020-10-04 MED ORDER — DEXTROSE 50 % IV SOLN: 1.0000 | Freq: Once | INTRAVENOUS | Status: DC

## 2020-10-04 MED ORDER — IPRATROPIUM-ALBUTEROL 0.5-2.5 (3) MG/3ML IN SOLN: 3.0000 mL | Freq: Two times a day (BID) | RESPIRATORY_TRACT | Status: DC

## 2020-10-04 MED ORDER — DOXYCYCLINE HYCLATE 100 MG PO TABS
100.0000 mg | ORAL_TABLET | Freq: Two times a day (BID) | ORAL | 0 refills | Status: AC
Start: 2020-10-04 — End: ?

## 2020-10-04 MED ORDER — BLOOD GLUCOSE MONITOR KIT: PACK | 0 refills | Status: AC

## 2020-10-04 NOTE — Progress Notes (Signed)
   10/04/20 1131  Advance Directives (For Healthcare)  Does Patient Have a Medical Advance Directive? Yes  Type of Advance Directive Living will  Copy of Living Will in Chart? Yes - validated most recent copy scanned in chart (See row information)  Would patient like information on creating a medical advance directive? Yes (Inpatient - patient requests chaplain consult to create a medical advance directive)  Advance Directive completed 10/04/20.  See notes of Chaplains. Point of Contact:Robin Oilton Tele: [phone]

## 2020-10-04 NOTE — Progress Notes (Signed)
Inpatient Diabetes Program Recommendations  AACE/ADA: New Consensus Statement on Inpatient Glycemic Control (2015)  Target Ranges:  Prepandial:   less than 140 mg/dL      Peak postprandial:   less than 180 mg/dL (1-2 hours)      Critically ill patients:  140 - 180 mg/dL   Lab Results  Component Value Date   GLUCAP 50 (L) 10/04/2020   HGBA1C 6.0 (H) 10/03/2020    Review of Glycemic Control Results for [name], [name] (MRN [id number]) as of 10/04/2020 14:05  Ref. Range 10/03/2020 21:13 10/04/2020 12:32 10/04/2020 13:44 10/04/2020 13:50 10/04/2020 14:01  Glucose-Capillary Latest Ref Range: 70 - 99 mg/dL 979 (H) 480 (H) 42 (LL) 41 (LL) 50 (L)   Diabetes history:  DM2 Outpatient Diabetes medications:  None Current orders for Inpatient glycemic control:  Novolog 0-15 units TID Novolog 4 units TID  Lantus 10 units QHS Prednisone 40 mg daily  Inpatient Diabetes Program Recommendations:    Noted patient was given 4 units this morning for breakfast; no fasting CBG charted.  Was 125 mg/dL at noon and covered with 6 units of Novolog.  Hypoglycemic at 1344 42 mg/dL.  Patient only ate 25% at breakfast.  Please hold Meal coverage if eats less than 50% of meal.    Might consider holding meal coverage for now.    Will continue to follow while inpatient.  Thank you, Dulce Sellar, RN, BSN Diabetes Coordinator Inpatient Diabetes Program [phone] (team pager from 8a-5p)

## 2020-10-04 NOTE — Progress Notes (Signed)
Was able to facilitate AD getting completed today, patient is scheduled to be discharged today, 1/21.

## 2020-10-04 NOTE — Discharge Instructions (Signed)
COPD and Physical Activity Chronic obstructive pulmonary disease (COPD) is a long-term (chronic) condition that affects the lungs. COPD is a general term that can be used to describe many different lung problems that cause lung swelling (inflammation) and limit airflow, including chronic bronchitis and emphysema. The main symptom of COPD is shortness of breath, which makes it harder to do even simple tasks. This can also make it harder to exercise and be active. Talk with your health care provider about treatments to help you breathe better and actions you can take to prevent breathing problems during physical activity. What are the benefits of exercising with COPD? Exercising regularly is an important part of a healthy lifestyle. You can still exercise and do physical activities even though you have COPD. Exercise and physical activity improve your shortness of breath by increasing blood flow (circulation). This causes your heart to pump more oxygen through your body. Moderate exercise can improve your:  Oxygen use.  Energy level.  Shortness of breath.  Strength in your breathing muscles.  Heart health.  Sleep.  Self-esteem and feelings of self-worth.  Depression, stress, and anxiety levels. Exercise can benefit everyone with COPD. The severity of your disease may affect how hard you can exercise, especially at first, but everyone can benefit. Talk with your health care provider about how much exercise is safe for you, and which activities and exercises are safe for you.   What actions can I take to prevent breathing problems during physical activity?  Sign up for a pulmonary rehabilitation program. This type of program may include: ? Education about lung diseases. ? Exercise classes that teach you how to exercise and be more active while improving your breathing. This usually involves:  Exercise using your lower extremities, such as a stationary bicycle.  About 30 minutes of exercise,  2 to 5 times per week, for 6 to 12 weeks  Strength training, such as push ups or leg lifts. ? Nutrition education. ? Group classes in which you can talk with others who also have COPD and learn ways to manage stress.  If you use an oxygen tank, you should use it while you exercise. Work with your health care provider to adjust your oxygen for your physical activity. Your resting flow rate is different from your flow rate during physical activity.  While you are exercising: ? Take slow breaths. ? Pace yourself and do not try to go too fast. ? Purse your lips while breathing out. Pursing your lips is similar to a kissing or whistling position. ? If doing exercise that uses a quick burst of effort, such as weight lifting:  Breathe in before starting the exercise.  Breathe out during the hardest part of the exercise (such as raising the weights). Where to find support You can find support for exercising with COPD from:  Your health care provider.  A pulmonary rehabilitation program.  Your local health department or community health programs.  Support groups, online or in-person. Your health care provider may be able to recommend support groups. Where to find more information You can find more information about exercising with COPD from:  American Lung Association: lung.org.  COPD Foundation: copdfoundation.org. Contact a health care provider if:  Your symptoms get worse.  You have chest pain.  You have nausea.  You have a fever.  You have trouble talking or catching your breath.  You want to start a new exercise program or a new activity. Summary  COPD is a general term   that can be used to describe many different lung problems that cause lung swelling (inflammation) and limit airflow. This includes chronic bronchitis and emphysema.  Exercise and physical activity improve your shortness of breath by increasing blood flow (circulation). This causes your heart to provide  more oxygen to your body.  Contact your health care provider before starting any exercise program or new activity. Ask your health care provider what exercises and activities are safe for you. This information is not intended to replace advice given to you by your health care provider. Make sure you discuss any questions you have with your health care provider. Document Revised: 12/21/2018 Document Reviewed: 09/23/2017 Elsevier Patient Education  2021 [employer]   Eating Plan for Chronic Obstructive Pulmonary Disease Chronic obstructive pulmonary disease (COPD) causes symptoms such as shortness of breath, coughing, and chest discomfort. These symptoms can make it difficult to eat enough to maintain a healthy weight. Generally, people with COPD should eat a diet that is high in calories, protein, and other nutrients to maintain body weight and to keep the lungs as healthy as possible. Depending on the medicines you take and other health conditions you may have, your health care provider may give you additional recommendations on what to eat or avoid. Talk with your health care provider about your goals for body weight, and work with a dietitian to develop an eating plan that is right for you. What are tips for following this plan? Reading food labels  Avoid foods with more than 300 milligrams (mg) of salt (sodium) per serving.  Choose foods that contain at least 4 grams (g) of fiber per serving. Try to eat 20-30 g of fiber each day.  Choose foods that are high in calories and protein, such as nuts, beans, yogurt, and cheese.   Shopping  Do not buy foods labeled as diet, low-calorie, or low-fat.  If you are able to eat dairy products: ? Avoid low-fat or skim milk. ? Buy dairy products that have at least 2% fat.  Buy nutritional supplement drinks.  Buy grains and prepared foods labeled as enriched or fortified.  Consider buying low-sodium, pre-made foods to conserve energy for  eating. Cooking  Add dry milk or protein powder to smoothies.  Cook with healthy fats, such as olive oil, canola oil, sunflower oil, and grapeseed oil.  Add oil, butter, cream cheese, or nut butters to foods to increase fat and calories.  To make foods easier to chew and swallow: ? Cook vegetables, pasta, and rice until soft. ? Cut or grind meat into very small pieces. ? Dip breads in liquid. Meal planning  Eat when you feel hungry.  Eat 5-6 small meals throughout the day.  Drink 6-8 glasses of water each day.  Do not drink liquids with meals. Drink liquids at the end of the meal to avoid feeling full too quickly.  Eat a variety of fruits and vegetables every day.  Ask for assistance from family or friends with planning and preparing meals as needed.  Avoid foods that cause you to feel bloated, such as carbonated drinks, fried foods, beans, broccoli, cabbage, and apples.  For older adults, ask your local agency on aging whether you are eligible for meal assistance programs, such as Meals on Wheels.   Lifestyle  Do not smoke.  Eat slowly. Take small bites and chew food well before swallowing.  Do not overeat. This may make it more difficult to breathe after eating.  Sit up while eating.  If needed, continue to use supplemental oxygen while eating.  Rest or relax for 30 minutes before and after eating.  Monitor your weight as told by your health care provider.  Exercise as told by your health care provider.   What foods should I eat? Fruits All fresh, dried, canned, or frozen fruits that do not cause gas. Vegetables All fresh, canned (no salt added), or frozen vegetables that do not cause gas. Grains Whole-grain bread. Enriched whole-grain pasta. Fortified whole-grain cereals. Fortified rice. Quinoa. Meats and other proteins Lean meat. Poultry. Fish. Dried beans. Unsalted nuts. Tofu. Eggs. Nut butters. Dairy Whole or 2% milk. Cheese. Yogurt. Fats and  oils Olive oil. Canola oil. Butter. Margarine. Beverages Water. Vegetable juice (no salt added). Decaffeinated coffee. Decaffeinated or herbal tea. Seasonings and condiments Fresh or dried herbs. Low-salt or salt-free seasonings. Low-sodium soy sauce. The items listed above may not be a complete list of foods and beverages you can eat. Contact a dietitian for more information. What foods should I avoid? Fruits Fruits that cause gas, such as apples or melon. Vegetables Vegetables that cause gas, such as broccoli, Brussels sprouts, cabbage, cauliflower, and onions. Canned vegetables with added salt. Meats and other proteins Fried meat. Salt-cured meat. Processed meat. Dairy Fat-free or low-fat milk, yogurt, or cheese. Processed cheese. Beverages Carbonated drinks. Caffeinated drinks, such as coffee, tea, and soft drinks. Juice. Alcohol. Vegetable juice with added salt. Seasonings and condiments Salt. Seasoning mixes with salt. Soy sauce. Rosita Fire. Other foods Clear soup or broth. Fried foods. Prepared frozen meals. The items listed above may not be a complete list of foods and beverages you should avoid. Contact a dietitian for more information. Summary  COPD symptoms can make it difficult to eat enough to maintain a healthy weight.  A COPD eating plan can help you maintain your body weight and keep your lungs as healthy as possible.  Eat a diet that is high in calories, protein, and other nutrients. Read labels to make sure that you are getting the right nutrients. Cook foods to make them easier to chew and swallow.  Eat 5-6 small meals throughout the day, and avoid foods that cause gas or make you feel bloated. This information is not intended to replace advice given to you by your health care provider. Make sure you discuss any questions you have with your health care provider. Document Revised: 07/09/2020 Document Reviewed: 07/09/2020 Elsevier Patient Education  2021 [employer].

## 2020-10-04 NOTE — Progress Notes (Signed)
CRITICAL VALUE ALERT  Critical Value:  Hypoglycemic episode. 13:44: CBG 42, 13:50 CBG:41, 14:01: CBG 50  Date & Time Notied:  10/04/20 at 13:50.  Provider Notified: Dr. Maryfrances Bunnell  Orders Received/Actions taken: STAT LABS: Insulin and C-petite, Glucose Capillary.

## 2020-10-04 NOTE — Discharge Summary (Signed)
Physician Discharge Summary  [name] HYQ:[id number] DOB: May 16, 1971 DOA: 10/02/2020  PCP: [facility]  Admit date: 10/02/2020 Discharge date: 10/04/2020  Admitted From: Home  Disposition:  Home   Recommendations for Outpatient Follow-up:  1. Follow up with PCP in 1-2 weeks   Home Health: None  Equipment/Devices: Nebulizer  Discharge Condition: Good  CODE STATUS: FULL Diet recommendation: Diabetic  Brief/Interim Summary: [name] is a 50 y.o. M with chronic pain on high doses opiates, DM, collagen vascular disease, PVD status post left AKA, and PTSD with bipolar who presented with few days of shortness of breath and wheezing as well as cough productive of clear sputum.  In the ER chest x-ray clear, hypoxic to 87%, improved with supplemental oxygen.      PRINCIPAL HOSPITAL DIAGNOSIS: Acute hypoxic respiratory failure due to COPD flare    Discharge Diagnoses:   Acute hypoxic respiratory failure due to COPD exacerbation Patient presented with dyspnea and hypoxia to 87% due to COPD. He was admitted and started on steroids, bronchodilators, and antibiotics.  His breathing improved until he felt close to baseline, and he was discharged to complete a prednisone burst, course of doxycycline, and given new nebulizer.   Diabetes Continue home regimen    Hypertension Coronary artery disease, secondary prevention Continue Imdur, carvedilol, Xarelto, pravastatin   PTSD Continue Depakote, quetiapine  Chronic pain Continue gabapentin, MS Contin, as needed oxycodone.  Patient counseled that he was on dosage of opiates that increases risk of death.              Discharge Instructions  Discharge Instructions    Discharge instructions   Complete by: As directed    From Dr. Loleta Books: You were admitted for a COPD flare You were treated with steroids, antibiotics and bronchodilators (breathing treatments)  Finish the steroids  by: Take prednisone 40 mg (two tabs) once daily for 4 more days   Finish the antibiotics by: Take doxycycline 100 mg twice daily for 4 more days    Use albuterol (either in an inhaler or in a nebulizer) three times a day on schedule for the next week, then after that, reduce to as needed use   Resume your other medicines as before  Go see your primary care doctor in 1 week   For home use only DME Nebulizer machine   Complete by: As directed    Patient needs a nebulizer to treat with the following condition: COPD exacerbation ([place])   Length of Need: Lifetime   Increase activity slowly   Complete by: As directed      Allergies as of 10/04/2020      Reactions   Demerol [meperidine] Anaphylaxis   Arithmin [antazoline]    Erythromycin    Piperacillin-tazobactam In Dex    SOB   Zosyn [piperacillin Sod-tazobactam So]       Medication List    STOP taking these medications   carbamazepine 200 MG tablet Commonly known as: TEGRETOL   collagenase ointment Commonly known as: Santyl   ferrous sulfate 325 (65 FE) MG EC tablet   pravastatin 20 MG tablet Commonly known as: PRAVACHOL     TAKE these medications   acetaminophen 500 MG tablet Commonly known as: TYLENOL Take 1,000 mg by mouth every 6 (six) hours as needed for mild pain.   albuterol 108 (90 Base) MCG/ACT inhaler Commonly known as: VENTOLIN HFA Inhale 2 puffs into the lungs every 4 (four) hours as needed for wheezing or shortness of  breath. What changed: Another medication with the same name was added. Make sure you understand how and when to take each.   albuterol (2.5 MG/3ML) 0.083% nebulizer solution Commonly known as: PROVENTIL Take 3 mLs (2.5 mg total) by nebulization every 4 (four) hours as needed for wheezing or shortness of breath. What changed: You were already taking a medication with the same name, and this prescription was added. Make sure you understand how and when to take each.   blood glucose  meter kit and supplies Kit Dispense based on patient and insurance preference. Use up to four times daily as directed. (FOR ICD-9 250.00, 250.01).   carvedilol 12.5 MG tablet Commonly known as: COREG Take 12.5 mg by mouth 2 (two) times daily.   divalproex 250 MG 24 hr tablet Commonly known as: DEPAKOTE ER Take 250 mg by mouth 2 (two) times daily.   doxycycline 100 MG tablet Commonly known as: VIBRA-TABS Take 1 tablet (100 mg total) by mouth every 12 (twelve) hours.   feeding supplement Liqd Take 237 mLs by mouth 3 (three) times daily between meals.   gabapentin 300 MG capsule Commonly known as: NEURONTIN Take 3 capsules by mouth 3 (three) times daily.   isosorbide mononitrate 30 MG 24 hr tablet Commonly known as: IMDUR Take 30 mg by mouth 2 (two) times daily.   morphine 30 MG 12 hr tablet Commonly known as: MS CONTIN Take 30 mg by mouth every 12 (twelve) hours.   multivitamin with minerals tablet Take 1 tablet by mouth daily.   nicotine 21 mg/24hr patch Commonly known as: NICODERM CQ - dosed in mg/24 hours Place 1 patch (21 mg total) onto the skin daily.   nitroGLYCERIN 0.4 MG SL tablet Commonly known as: NITROSTAT Place 0.4 mg under the tongue. Place 0.4 mg under the tongue as needed for chest pain.   oxycodone 5 MG capsule Commonly known as: OXY-IR Take 1 capsule (5 mg total) by mouth every 4 (four) hours as needed. What changed:   how much to take  when to take this   pantoprazole 40 MG tablet Commonly known as: PROTONIX Take 40 mg by mouth 2 (two) times daily.   predniSONE 20 MG tablet Commonly known as: DELTASONE Take 2 tablets (40 mg total) by mouth daily with breakfast. Start taking on: October 05, 2020   QUEtiapine 400 MG tablet Commonly known as: SEROQUEL Take 400 mg by mouth at bedtime.   rivaroxaban 20 MG Tabs tablet Commonly known as: XARELTO Take 1 tablet (20 mg total) by mouth at bedtime.   traZODone 50 MG tablet Commonly known as:  DESYREL Take 50 mg by mouth at bedtime as needed for sleep.            Durable Medical Equipment  (From admission, onward)         Start     Ordered   10/04/20 0000  For home use only DME Nebulizer machine       Question Answer Comment  Patient needs a nebulizer to treat with the following condition COPD exacerbation ([place])   Length of Need Lifetime      10/04/20 1323          Follow-up [facility]. Schedule an appointment as soon as possible for a visit in 1 week(s).   Specialty: General Practice Contact information: [address] [place] [address] [phone]              Allergies  Allergen Reactions   Demerol [Meperidine] Anaphylaxis   Arithmin [Antazoline]    Erythromycin    Piperacillin-Tazobactam In Dex     SOB   Zosyn [Piperacillin Sod-Tazobactam So]        Procedures/Studies: DG Chest 2 View  Result Date: 10/02/2020 CLINICAL DATA:  Shortness of breath. EXAM: CHEST - 2 VIEW COMPARISON:  February 25, 2020 FINDINGS: The heart size and mediastinal contours are within normal limits. Both lungs are clear. A chronic compression fracture deformity is seen at the level of T11. IMPRESSION: No active cardiopulmonary disease. Electronically Signed   By: Virgina Norfolk M.D.   On: 10/02/2020 15:01   DG Wrist Complete Left  Result Date: 09/20/2020 CLINICAL DATA:  Pain and swelling status post fall EXAM: LEFT WRIST - COMPLETE 3+ VIEW COMPARISON:  None. FINDINGS: There is an acute, angulated and impacted fracture of the distal radius with surrounding soft tissue swelling. There is no dislocation. No radiopaque foreign body. IMPRESSION: Acute, angulated and impacted fracture of the distal radius with surrounding soft tissue swelling. Electronically Signed   By: Constance Holster M.D.   On: 09/20/2020 18:16       Subjective: Breathing is feeling better.  No new fever, chest pain, sputum.  Discharge Exam: Vitals:   10/04/20  1146 10/04/20 1359  BP: 121/81 121/90  Pulse: 75 80  Resp: 16 17  Temp: (!) 97.5 F (36.4 C) 97.6 F (36.4 C)  SpO2: 90% 94%   Vitals:   10/04/20 0729 10/04/20 0741 10/04/20 1146 10/04/20 1359  BP:  123/84 121/81 121/90  Pulse: 68 66 75 80  Resp: _0 Temp:  98.1 F (36.7 C) (!) 97.5 F (36.4 C) 97.6 F (36.4 C)  TempSrc:  Oral Oral Oral  SpO2: 94% 96% 90% 94%  Weight:      Height:        General: Pt is alert, awake, not in acute distress Cardiovascular: RRR, nl S1-S2, no murmurs appreciated.   No LE edema.   Respiratory: Normal respiratory rate and rhythm.  CTAB without rales.  Faint low pitched wheezing Abdominal: Abdomen soft and non-tender.  No distension or HSM.   Neuro/Psych: Strength symmetric in upper and lower extremities.  Judgment and insight appear normal.   The results of significant diagnostics from this hospitalization (including imaging, microbiology, ancillary and laboratory) are listed below for reference.     Microbiology: Recent Results (from the past 240 hour(s))  Resp Panel by RT-PCR (Flu A&B, Covid) Nasopharyngeal Swab     Status: None   Collection Time: 10/02/20  2:57 PM   Specimen: Nasopharyngeal Swab; Nasopharyngeal(NP) swabs in vial transport medium  Result Value Ref Range Status   SARS Coronavirus 2 by RT PCR NEGATIVE NEGATIVE Final    Comment: (NOTE) SARS-CoV-2 target nucleic acids are NOT DETECTED.  The SARS-CoV-2 RNA is generally detectable in upper respiratory specimens during the acute phase of infection. The lowest concentration of SARS-CoV-2 viral copies this assay can detect is 138 copies/mL. A negative result does not preclude SARS-Cov-2 infection and should not be used as the sole basis for treatment or other patient management decisions. A negative result may occur with  improper specimen collection/handling, submission of specimen other than nasopharyngeal swab, presence of viral mutation(s) within the areas targeted  by this assay, and inadequate number of viral copies(<138 copies/mL). A negative result must be combined with clinical observations, patient history, and epidemiological information. The expected result is Negative.  Fact Sheet for Patients:  EntrepreneurPulse.com.au  Fact Sheet for Healthcare Providers:  IncredibleEmployment.be  This test is no t yet approved or cleared by the [place] FDA and  has been authorized for detection and/or diagnosis of SARS-CoV-2 by FDA under an Emergency Use Authorization (EUA). This EUA will remain  in effect (meaning this test can be used) for the duration of the COVID-19 declaration under Section 564(b)(1) of the Act, 21 U.S.C.section 360bbb-3(b)(1), unless the authorization is terminated  or revoked sooner.       Influenza A by PCR NEGATIVE NEGATIVE Final   Influenza B by PCR NEGATIVE NEGATIVE Final    Comment: (NOTE) The Xpert Xpress SARS-CoV-2/FLU/RSV plus assay is intended as an aid in the diagnosis of influenza from Nasopharyngeal swab specimens and should not be used as a sole basis for treatment. Nasal washings and aspirates are unacceptable for Xpert Xpress SARS-CoV-2/FLU/RSV testing.  Fact Sheet for Patients: EntrepreneurPulse.com.au  Fact Sheet for Healthcare Providers: IncredibleEmployment.be  This test is not yet approved or cleared by the [place] FDA and has been authorized for detection and/or diagnosis of SARS-CoV-2 by FDA under an Emergency Use Authorization (EUA). This EUA will remain in effect (meaning this test can be used) for the duration of the COVID-19 declaration under Section 564(b)(1) of the Act, 21 U.S.C. section 360bbb-3(b)(1), unless the authorization is terminated or revoked.  Performed at [facility], [address] [place], [place] [address]      Labs: BNP (last 3 results) No results for input(s): BNP in the  last 8760 hours. Basic Metabolic Panel: Recent Labs  Lab 10/02/20 1429 10/03/20 0832  NA 138 136  K 4.3 5.0  CL 99 95*  CO2 28 33*  GLUCOSE 127* 182*  BUN 8 8  CREATININE 0.61 0.70  CALCIUM 8.5* 8.7*   Liver Function Tests: Recent Labs  Lab 10/03/20 0832  AST 22  ALT <5  ALKPHOS 231*  BILITOT 0.9  PROT 7.0  ALBUMIN 2.1*   No results for input(s): LIPASE, AMYLASE in the last 168 hours. No results for input(s): AMMONIA in the last 168 hours. CBC: Recent Labs  Lab 10/02/20 1429 10/03/20 0832  WBC 8.0 6.2  NEUTROABS  --  5.2  HGB 12.7* 13.2  HCT 38.4* 39.8  MCV 103.8* 101.8*  PLT 338 388   Cardiac Enzymes: No results for input(s): CKTOTAL, CKMB, CKMBINDEX, TROPONINI in the last 168 hours. BNP: Invalid input(s): POCBNP CBG: Recent Labs  Lab 10/03/20 2113 10/04/20 1232 10/04/20 1344 10/04/20 1350 10/04/20 1401  GLUCAP 119* 125* 42* 41* 50*   D-Dimer No results for input(s): DDIMER in the last 72 hours. Hgb A1c Recent Labs    10/03/20 0832  HGBA1C 6.0*   Lipid Profile No results for input(s): CHOL, HDL, LDLCALC, TRIG, CHOLHDL, LDLDIRECT in the last 72 hours. Thyroid function studies No results for input(s): TSH, T4TOTAL, T3FREE, THYROIDAB in the last 72 hours.  Invalid input(s): FREET3 Anemia work up No results for input(s): VITAMINB12, FOLATE, FERRITIN, TIBC, IRON, RETICCTPCT in the last 72 hours. Urinalysis    Component Value Date/Time   COLORURINE YELLOW (A) 11/11/2015 1230   APPEARANCEUR CLEAR (A) 11/11/2015 1230   APPEARANCEUR Clear 09/05/2014 0349   LABSPEC 1.025 11/11/2015 1230   LABSPEC 1.012 09/05/2014 0349   PHURINE 9.0 (H) 11/11/2015 1230   GLUCOSEU NEGATIVE 11/11/2015 1230   GLUCOSEU Negative 09/05/2014 0349   HGBUR NEGATIVE 11/11/2015 1230   BILIRUBINUR NEGATIVE 11/11/2015 1230   BILIRUBINUR Negative 09/05/2014 0349   KETONESUR 1+ (  A) 11/11/2015 1230   PROTEINUR 100 (A) 11/11/2015 1230   UROBILINOGEN 0.2 04/01/2011 0321    NITRITE NEGATIVE 11/11/2015 1230   LEUKOCYTESUR NEGATIVE 11/11/2015 1230   LEUKOCYTESUR Negative 09/05/2014 0349   Sepsis Labs Invalid input(s): PROCALCITONIN,  WBC,  LACTICIDVEN Microbiology Recent Results (from the past 240 hour(s))  Resp Panel by RT-PCR (Flu A&B, Covid) Nasopharyngeal Swab     Status: None   Collection Time: 10/02/20  2:57 PM   Specimen: Nasopharyngeal Swab; Nasopharyngeal(NP) swabs in vial transport medium  Result Value Ref Range Status   SARS Coronavirus 2 by RT PCR NEGATIVE NEGATIVE Final    Comment: (NOTE) SARS-CoV-2 target nucleic acids are NOT DETECTED.  The SARS-CoV-2 RNA is generally detectable in upper respiratory specimens during the acute phase of infection. The lowest concentration of SARS-CoV-2 viral copies this assay can detect is 138 copies/mL. A negative result does not preclude SARS-Cov-2 infection and should not be used as the sole basis for treatment or other patient management decisions. A negative result may occur with  improper specimen collection/handling, submission of specimen other than nasopharyngeal swab, presence of viral mutation(s) within the areas targeted by this assay, and inadequate number of viral copies(<138 copies/mL). A negative result must be combined with clinical observations, patient history, and epidemiological information. The expected result is Negative.  Fact Sheet for Patients:  EntrepreneurPulse.com.au  Fact Sheet for Healthcare Providers:  IncredibleEmployment.be  This test is no t yet approved or cleared by the [place] FDA and  has been authorized for detection and/or diagnosis of SARS-CoV-2 by FDA under an Emergency Use Authorization (EUA). This EUA will remain  in effect (meaning this test can be used) for the duration of the COVID-19 declaration under Section 564(b)(1) of the Act, 21 U.S.C.section 360bbb-3(b)(1), unless the authorization is terminated  or revoked  sooner.       Influenza A by PCR NEGATIVE NEGATIVE Final   Influenza B by PCR NEGATIVE NEGATIVE Final    Comment: (NOTE) The Xpert Xpress SARS-CoV-2/FLU/RSV plus assay is intended as an aid in the diagnosis of influenza from Nasopharyngeal swab specimens and should not be used as a sole basis for treatment. Nasal washings and aspirates are unacceptable for Xpert Xpress SARS-CoV-2/FLU/RSV testing.  Fact Sheet for Patients: EntrepreneurPulse.com.au  Fact Sheet for Healthcare Providers: IncredibleEmployment.be  This test is not yet approved or cleared by the [place] FDA and has been authorized for detection and/or diagnosis of SARS-CoV-2 by FDA under an Emergency Use Authorization (EUA). This EUA will remain in effect (meaning this test can be used) for the duration of the COVID-19 declaration under Section 564(b)(1) of the Act, 21 U.S.C. section 360bbb-3(b)(1), unless the authorization is terminated or revoked.  Performed at [facility], [address], [place], [place] [address]      Time coordinating discharge: 35 minutes      SIGNED:   Edwin Dada, MD  [facility] 10/04/2020, 6:21 PM

## 2020-10-04 NOTE — Plan of Care (Signed)

## 2020-10-04 NOTE — Care Management Important Message (Signed)
Important Message  Patient Details  Name: [name] MRN: [id number] Date of Birth: May 22, 1971   Medicare Important Message Given:  N/A - LOS <3 / Initial given by admissions     [name] 10/04/2020, 8:57 AM

## 2020-10-04 NOTE — Progress Notes (Signed)
Reviewed discharge instructions with pt. Pt verbalized understanding. Pt discharged from [facility] with personal belongings. IV cath intact upon removal. Staff wheeled pt out. Pt transported to home via private vehicle.

## 2020-10-06 ENCOUNTER — Emergency Department
Admission: EM | Admit: 2020-10-06 | Discharge: 2020-10-06 | Disposition: A | Discharge status: Home / Self Care | Payer: Medicare HMO | Attending: Emergency Medicine | Admitting: Emergency Medicine

## 2020-10-06 ENCOUNTER — Other Ambulatory Visit: Payer: Self-pay

## 2020-10-06 DIAGNOSIS — F17210 Nicotine dependence, cigarettes, uncomplicated: Secondary | ICD-10-CM | POA: Insufficient documentation

## 2020-10-06 DIAGNOSIS — Z79899 Other long term (current) drug therapy: Secondary | ICD-10-CM | POA: Diagnosis not present

## 2020-10-06 DIAGNOSIS — J441 Chronic obstructive pulmonary disease with (acute) exacerbation: Secondary | ICD-10-CM | POA: Diagnosis not present

## 2020-10-06 DIAGNOSIS — E119 Type 2 diabetes mellitus without complications: Secondary | ICD-10-CM | POA: Diagnosis not present

## 2020-10-06 DIAGNOSIS — J45909 Unspecified asthma, uncomplicated: Secondary | ICD-10-CM | POA: Diagnosis not present

## 2020-10-06 DIAGNOSIS — Z7901 Long term (current) use of anticoagulants: Secondary | ICD-10-CM | POA: Diagnosis not present

## 2020-10-06 DIAGNOSIS — F32A Depression, unspecified: Secondary | ICD-10-CM

## 2020-10-06 DIAGNOSIS — F1924 Other psychoactive substance dependence with psychoactive substance-induced mood disorder: Secondary | ICD-10-CM | POA: Diagnosis not present

## 2020-10-06 DIAGNOSIS — I10 Essential (primary) hypertension: Secondary | ICD-10-CM | POA: Diagnosis not present

## 2020-10-06 DIAGNOSIS — F1994 Other psychoactive substance use, unspecified with psychoactive substance-induced mood disorder: Secondary | ICD-10-CM

## 2020-10-06 DIAGNOSIS — F1099 Alcohol use, unspecified with unspecified alcohol-induced disorder: Secondary | ICD-10-CM | POA: Diagnosis present

## 2020-10-06 NOTE — ED Notes (Signed)
Hourly rounding completed at this time, patient currently asleep in room. No complaints, stable, and in no acute distress. Q15 minute rounds and monitoring via Rover and Officer to continue. 

## 2020-10-06 NOTE — ED Notes (Signed)
Pt provided urinal to pt for when pt needs to urinate due to pt being double amputee.

## 2020-10-06 NOTE — ED Notes (Signed)
Pt given phone to call ride 

## 2020-10-06 NOTE — ED Notes (Signed)
Pt resting in bed, states he feels like he would be better dead, states he has been sick for "so long", had Suicidal thoughts earlier today, none now, feels he is a burden on his family

## 2020-10-06 NOTE — Discharge Instructions (Addendum)
You have been seen in the Emergency Department (ED) today for a psychiatric complaint.  You have been evaluated and we believe you are safe to be discharged from the hospital.   ° °Please return to the ED immediately if you have ANY thoughts of hurting yourself or anyone else, so that we may help you. ° °Please avoid alcohol and drug use. ° °Follow up with your doctor and/or therapist as soon as possible regarding today's ED visit.   Please follow up any other recommendations and clinic appointments provided by the psychiatry team that saw you in the Emergency Department. °

## 2020-10-06 NOTE — ED Notes (Signed)
Lab contacted now to verify they would be here to assist with blood draw for pt. States that he is on list but they are short staffed and will be here as soon as they can be.

## 2020-10-06 NOTE — ED Notes (Signed)
Dawn, RN in triage has called lab already due to pt being hard stick. Lab will be coming to draw blood work as soon as they can, per [employer].

## 2020-10-06 NOTE — ED Notes (Addendum)
Green colored t-shirt, camo colored hoodie, yellow no-ski sock, green colored scrub pants, silver colored wedding band. Brown colored boot

## 2020-10-06 NOTE — ED Provider Notes (Signed)
[facility] Emergency Department Provider Note  ____________________________________________   Event Date/Time   First MD Initiated Contact with Patient 10/06/20 0403     (approximate)  I have reviewed the triage vital signs and the nursing notes.   HISTORY  Chief Complaint Psychiatric Evaluation    HPI [name] is a 50 y.o. male well-known to the emergency department with extensive medical history as listed below in addition to alcohol dependence.  He presents tonight voluntarily stating that he feels like he is a burden to his family and does not have a reason to live.  He admits to heavy alcohol use earlier tonight.  He says he thinks that the alcohol made it worse and made him feel worse, but he feels much better now after 5-1/2 hours in the emergency department.  He said that he never wanted to kill himself, he just felt like he did not have much of a reason to live due to his chronic medical issues.  He lives with other people and has family support.  These are long-term issues that seemed acutely worse tonight but he feels better now.  He denies fever/chills, sore throat, chest pain, shortness of breath, nausea, vomiting, and abdominal pain.   Nothing in particular makes the symptoms better and alcohol seems to have made him feel worse.        Past Medical History:  Diagnosis Date  . Asthma   . Collagen vascular disease ([place])   . COPD (chronic obstructive pulmonary disease) ([place])   . Diabetes mellitus without complication ([place])   . Gastric ulcer   . Hypertension   . Myocardial infarction ([place])   . Renal disorder   . Thrombocytopenia [facility])     Patient Active Problem List   Diagnosis Date Noted  . COPD exacerbation ([place]) 10/02/2020  . COPD with acute exacerbation ([place]) 02/25/2020  . Acute respiratory failure with hypoxia and hypercapnia ([place]) 02/25/2020  . Unilateral AKA, left ([place]) 02/25/2020  . Hyponatremia 06/01/2018  . CAP  (community acquired pneumonia) 06/01/2018  . GERD (gastroesophageal reflux disease) 06/01/2018  . Necrotizing fasciitis ([place])   . Diabetic foot infection ([place]) 05/08/2015  . DM (diabetes mellitus) ([place]) 05/08/2015  . HTN (hypertension) 05/08/2015  . COPD (chronic obstructive pulmonary disease) ([place]) 05/08/2015    Past Surgical History:  Procedure Laterality Date  . CHOLECYSTECTOMY    . FOOT SURGERY Left   . left below knee amputation      Prior to Admission medications   Medication Sig Start Date End Date Taking? Authorizing Provider  acetaminophen (TYLENOL) 500 MG tablet Take 1,000 mg by mouth every 6 (six) hours as needed for mild pain.  05/09/12   [provider]  albuterol (PROVENTIL) (2.5 MG/3ML) 0.083% nebulizer solution Take 3 mLs (2.5 mg total) by nebulization every 4 (four) hours as needed for wheezing or shortness of breath. 10/04/20 10/04/21  Danford, Suann Larry, MD  albuterol (VENTOLIN HFA) 108 (90 Base) MCG/ACT inhaler Inhale 2 puffs into the lungs every 4 (four) hours as needed for wheezing or shortness of breath.  08/20/14   [provider]  blood glucose meter kit and supplies KIT Dispense based on patient and insurance preference. Use up to four times daily as directed. (FOR ICD-9 250.00, 250.01). 10/04/20   Danford, Suann Larry, MD  carvedilol (COREG) 12.5 MG tablet Take 12.5 mg by mouth 2 (two) times daily.    [provider]  divalproex (DEPAKOTE ER) 250 MG 24 hr  tablet Take 250 mg by mouth 2 (two) times daily.    [provider]  doxycycline (VIBRA-TABS) 100 MG tablet Take 1 tablet (100 mg total) by mouth every 12 (twelve) hours. 10/04/20   Danford, Suann Larry, MD  feeding supplement, ENSURE ENLIVE, (ENSURE ENLIVE) LIQD Take 237 mLs by mouth 3 (three) times daily between meals. 02/28/20   Fritzi Mandes, MD  gabapentin (NEURONTIN) 300 MG capsule Take 3 capsules by mouth 3 (three) times daily.  03/09/15 10/02/20  [provider]   isosorbide mononitrate (IMDUR) 30 MG 24 hr tablet Take 30 mg by mouth 2 (two) times daily.    [provider]  morphine (MS CONTIN) 30 MG 12 hr tablet Take 30 mg by mouth every 12 (twelve) hours.    [provider]  Multiple Vitamins-Minerals (MULTIVITAMIN WITH MINERALS) tablet Take 1 tablet by mouth daily.    [provider]  nicotine (NICODERM CQ - DOSED IN MG/24 HOURS) 21 mg/24hr patch Place 1 patch (21 mg total) onto the skin daily. 10/04/20 10/04/21  Danford, Suann Larry, MD  nitroGLYCERIN (NITROSTAT) 0.4 MG SL tablet Place 0.4 mg under the tongue. Place 0.4 mg under the tongue as needed for chest pain.    [provider]  oxycodone (OXY-IR) 5 MG capsule Take 1 capsule (5 mg total) by mouth every 4 (four) hours as needed. Patient taking differently: Take 15 mg by mouth 3 (three) times daily. 09/26/15   Johnn Hai, PA-C  pantoprazole (PROTONIX) 40 MG tablet Take 40 mg by mouth 2 (two) times daily. 10/30/14   [provider]  predniSONE (DELTASONE) 20 MG tablet Take 2 tablets (40 mg total) by mouth daily with breakfast. 10/05/20   Danford, Suann Larry, MD  QUEtiapine (SEROQUEL) 400 MG tablet Take 400 mg by mouth at bedtime.    [provider]  rivaroxaban (XARELTO) 20 MG TABS tablet Take 1 tablet (20 mg total) by mouth at bedtime. 02/28/20   Fritzi Mandes, MD  traZODone (DESYREL) 50 MG tablet Take 50 mg by mouth at bedtime as needed for sleep.    [provider]    Allergies Demerol [meperidine], Arithmin [antazoline], Erythromycin, Piperacillin-tazobactam in dex, and Zosyn [piperacillin sod-tazobactam so]  Family History  Adopted: Yes    Social History Social History   Tobacco Use  . Smoking status: Current Every Day Smoker    Packs/day: 1.00    Types: Cigarettes  . Smokeless tobacco: Never Used  Substance Use Topics  . Alcohol use: Yes    Comment: two 40oz beer Daily  . Drug use: No    Review of  Systems Constitutional: No fever/chills Eyes: No visual changes. ENT: No sore throat. Cardiovascular: Denies chest pain. Respiratory: Denies shortness of breath. Gastrointestinal: No abdominal pain.  No nausea, no vomiting.  No diarrhea.  No constipation. Genitourinary: Negative for dysuria. Musculoskeletal: Negative for neck pain.  Negative for back pain. Integumentary: Negative for rash. Neurological: Negative for headaches, focal weakness or numbness. Psychiatric:  Feeling like a burden to his family but does not want to harm himself or anyone else.  Alcohol use earlier tonight.  ____________________________________________   PHYSICAL EXAM:  VITAL SIGNS: ED Triage Vitals  Enc Vitals Group     BP 10/06/20 0130 135/87     Pulse Rate 10/06/20 0130 98     Resp 10/06/20 0130 20     Temp 10/06/20 0130 97.7 F (36.5 C)     Temp Source 10/06/20 0130 Oral  SpO2 10/06/20 0130 96 %     Weight 10/06/20 0132 74 kg (163 lb 2.3 oz)     Height 10/06/20 0132 1.854 m ('6\' 1"' )     Head Circumference --      Peak Flow --      Pain Score 10/06/20 0320 0     Pain Loc --      Pain Edu? --      Excl. in [place]? --     Constitutional: Alert and oriented.  Eyes: Conjunctivae are normal.  Head: Atraumatic. Nose: No congestion/rhinnorhea. Mouth/Throat: Patient is wearing a mask. Neck: No stridor.  No meningeal signs.   Cardiovascular: Normal rate, regular rhythm. Good peripheral circulation. Respiratory: Normal respiratory effort.  No retractions. Gastrointestinal: Soft and nontender. No distention.  Musculoskeletal: Status post left BKA.  Patient has a cast on his left forearm.  Neurologic:  Normal speech and language. No gross focal neurologic deficits are appreciated.  Skin:  Skin is warm, dry and intact. Psychiatric: Shows relatively good insight into his situation.  He admits to alcohol use and some depression but feels it is no worse than usual at this moment but that it was worsened due  to the intoxication earlier.  He is adamant that he does not want to die, harm himself, nor harm anyone else.  ____________________________________________   LABS (all labs ordered are listed, but only abnormal results are displayed)  Labs Reviewed - No data to display ____________________________________________  EKG  No indication for emergent EKG ____________________________________________  RADIOLOGY I, Hinda Kehr, personally viewed and evaluated these images (plain radiographs) as part of my medical decision making, as well as reviewing the written report by the radiologist.  ED MD interpretation: No indication for emergent imaging  Official radiology report(s): No results found.  ____________________________________________   PROCEDURES   Procedure(s) performed (including Critical Care):  Procedures   ____________________________________________   INITIAL IMPRESSION / MDM / [place] / ED COURSE  As part of my medical decision making, I reviewed the following data within the [facility] notes reviewed and incorporated, Old chart reviewed and Notes from prior ED visits   Differential diagnosis includes, but is not limited to, depression, substance-induced mood disorder, alcohol use, electrolyte or metabolic abnormality.  We have kept the patient safe in the emergency department for 5 and half hours.  He is clinically sober at this time and admits that the alcohol made him feel worse than he does at baseline.  He shows good insight into his situation and says that he has no desire to die or harm himself or anyone else.  He has family support at home.  He seems remorseful about coming in and said that he does not think he would be of benefit to speak with psychiatry.  I agree with this assessment as he does not meet IVC nor inpatient psychiatric treatment criteria.  He is appropriate for discharge and outpatient follow-up.  I will  give him information about [facility].  I gave my usual and customary return precautions.   ____________________________________________  FINAL CLINICAL IMPRESSION(S) / ED DIAGNOSES  Final diagnoses:  Substance induced mood disorder (HCC)  Depression, unspecified depression type     MEDICATIONS GIVEN DURING THIS VISIT:  Medications - No data to display   ED Discharge Orders    None      *Please note:  [name] was evaluated in Emergency Department on 10/06/2020 for the symptoms described in the history of  present illness. He was evaluated in the context of the global COVID-19 pandemic, which necessitated consideration that the patient might be at risk for infection with the SARS-CoV-2 virus that causes COVID-19. Institutional protocols and algorithms that pertain to the evaluation of patients at risk for COVID-19 are in a state of rapid change based on information released by regulatory bodies including the CDC and federal and state organizations. These policies and algorithms were followed during the patient's care in the ED.  Some ED evaluations and interventions may be delayed as a result of limited staffing during and after the pandemic.*  Note:  This document was prepared using Dragon voice recognition software and may include unintentional dictation errors.   Hinda Kehr, MD 10/06/20 [phone]

## 2020-10-06 NOTE — ED Notes (Signed)
Pt given belongings bag 2/2

## 2020-10-06 NOTE — ED Notes (Signed)
Patient transferred from Triage to room 20 after dressing out and screening for contraband. Report received from [facility] including situation, background, assessment and recommendations. Pt oriented to [employer] including Q15 minute rounds as well as Psychologist, counselling for their protection. Patient is alert and oriented, warm and dry in no acute distress. Patient denies SI, HI, and AVH but reports SI earlier today with no plan and feels he is a burden on his family. Pt. Encouraged to let this nurse know if needs arise.

## 2020-10-06 NOTE — Progress Notes (Signed)
   10/06/20 0225  Clinical Encounter Type  Visited With Patient  Visit Type ED  Referral From Patient  Consult/Referral To Chaplain  Spiritual Encounters  Spiritual Needs Emotional  Stress Factors  Patient Stress Factors Loss of control;Major life changes;Health changes;Family relationships  Advance Directives (For Healthcare)  Does Patient Have a Medical Advance Directive? Yes  Type of Advance Directive Healthcare Power of Attorney   Ad the chaplain, on call I received a page last night that [name] had requested to speak with a chaplain. When I arrived he was grateful for the visit. I listened with compassion as he shared some things that had happened early in his life. He was appreciative of the visit for someone to just listen to him.   Chaplain Trudie Buckler, [place]

## 2020-10-06 NOTE — ED Triage Notes (Signed)
Patient states "I don't feel good"  When asked to clarify patient states he is tired of being sick.  When asked if he wanted to harm himself patient states "I'm not going to answer that".

## 2020-10-07 ENCOUNTER — Other Ambulatory Visit: Payer: Self-pay

## 2020-10-07 LAB — GLUCOSE, CAPILLARY: Glucose-Capillary: 42 mg/dL — CL (ref 70–99)

## 2020-10-07 NOTE — Patient Outreach (Signed)
[facility]) Care Management  10/07/2020  GARRON ELINE 1971/06/25 [id number]    EMMI-General Discharge RED ON EMMI ALERT Day # 1 Date: 10/06/2020 Red Alert Reason: "Scheduled follow-up? No"   Outreach attempt #1 to patient. No answer at present. RN CM left HIPAA compliant voicemail along with contact info.        Plan: RN CM will make outreach attempt to patient within 3-4 business days. RN CM will send unsuccessful outreach letter to patient.  Antionette Fairy, RN,BSN,CCM [facility] Care Management Telephonic Care Management Coordinator Direct Phone: [phone] Toll Free: [phone] Fax: [phone]

## 2020-10-08 ENCOUNTER — Other Ambulatory Visit: Payer: Self-pay

## 2020-10-08 NOTE — Patient Outreach (Signed)
[facility]) Care Management  10/08/2020  [name] 05/23/1971 [id number]   EMMI-General Discharge RED ON EMMI ALERT Day # 1 Date: 10/06/2020 Red Alert Reason: "Scheduled follow-up? No"   Outreach attempt # 2 to patient. No answer after several rings.       Plan: RN CM will make outreach attempt to patient within 3-4 business days.  Antionette Fairy, RN,BSN,CCM [facility] Care Management Telephonic Care Management Coordinator Direct Phone: [phone] Toll Free: [phone] Fax: [phone]

## 2020-10-09 ENCOUNTER — Other Ambulatory Visit: Payer: Self-pay

## 2020-10-09 NOTE — Patient Outreach (Signed)
[facility]) Care Management  10/09/2020  [name] [birth year] [id number]   EMMI-General Discharge RED ON EMMI ALERT Day #1 Date:10/06/2020 Red Alert Reason:"Scheduled follow-up? No"    Outreach attempt #3 to patient. Spoke with patient who reports that he is doing well. He denies any acute issues or concerns at this time. RN CM reviewed and addressed red alert. [facility] He will be having a tele health follow up appt. He goes for an appt regarding his hand on tomorrow. He denies any issues with transportation. Patient confirms that he has all his meds in the home and no issues regarding them. He denies any RN CM needs or concerns at this time. Patient has completed post discharge automated calls.       Plan: RN CM will close case at this time.   Antionette Fairy, RN,BSN,CCM [facility] Care Management Telephonic Care Management Coordinator Direct Phone: [phone] Toll Free: [phone] Fax: [phone]

## 2020-10-10 DIAGNOSIS — S52502D Unspecified fracture of the lower end of left radius, subsequent encounter for closed fracture with routine healing: Secondary | ICD-10-CM | POA: Diagnosis not present

## 2020-10-12 DIAGNOSIS — F1120 Opioid dependence, uncomplicated: Secondary | ICD-10-CM | POA: Diagnosis not present

## 2020-10-12 DIAGNOSIS — F419 Anxiety disorder, unspecified: Secondary | ICD-10-CM | POA: Diagnosis not present

## 2020-10-12 DIAGNOSIS — F329 Major depressive disorder, single episode, unspecified: Secondary | ICD-10-CM | POA: Diagnosis not present

## 2020-10-12 DIAGNOSIS — F10239 Alcohol dependence with withdrawal, unspecified: Secondary | ICD-10-CM | POA: Diagnosis not present

## 2020-10-12 DIAGNOSIS — F4310 Post-traumatic stress disorder, unspecified: Secondary | ICD-10-CM | POA: Diagnosis not present

## 2020-10-12 DIAGNOSIS — F319 Bipolar disorder, unspecified: Secondary | ICD-10-CM | POA: Diagnosis not present

## 2020-10-12 DIAGNOSIS — F1123 Opioid dependence with withdrawal: Secondary | ICD-10-CM | POA: Diagnosis not present

## 2020-10-12 DIAGNOSIS — F1020 Alcohol dependence, uncomplicated: Secondary | ICD-10-CM | POA: Diagnosis not present

## 2020-10-16 DIAGNOSIS — F1020 Alcohol dependence, uncomplicated: Secondary | ICD-10-CM | POA: Diagnosis not present

## 2020-10-16 DIAGNOSIS — F419 Anxiety disorder, unspecified: Secondary | ICD-10-CM | POA: Diagnosis not present

## 2020-10-16 DIAGNOSIS — F1120 Opioid dependence, uncomplicated: Secondary | ICD-10-CM | POA: Diagnosis not present

## 2020-10-16 DIAGNOSIS — F1123 Opioid dependence with withdrawal: Secondary | ICD-10-CM | POA: Diagnosis not present

## 2020-10-16 DIAGNOSIS — F10239 Alcohol dependence with withdrawal, unspecified: Secondary | ICD-10-CM | POA: Diagnosis not present

## 2020-10-16 DIAGNOSIS — F329 Major depressive disorder, single episode, unspecified: Secondary | ICD-10-CM | POA: Diagnosis not present

## 2020-10-16 DIAGNOSIS — F4310 Post-traumatic stress disorder, unspecified: Secondary | ICD-10-CM | POA: Diagnosis not present

## 2020-10-16 DIAGNOSIS — F319 Bipolar disorder, unspecified: Secondary | ICD-10-CM | POA: Diagnosis not present

## 2020-10-17 DIAGNOSIS — F329 Major depressive disorder, single episode, unspecified: Secondary | ICD-10-CM | POA: Diagnosis not present

## 2020-10-17 DIAGNOSIS — F1020 Alcohol dependence, uncomplicated: Secondary | ICD-10-CM | POA: Diagnosis not present

## 2020-10-17 DIAGNOSIS — F419 Anxiety disorder, unspecified: Secondary | ICD-10-CM | POA: Diagnosis not present

## 2020-10-17 DIAGNOSIS — F1120 Opioid dependence, uncomplicated: Secondary | ICD-10-CM | POA: Diagnosis not present

## 2020-10-17 DIAGNOSIS — F10239 Alcohol dependence with withdrawal, unspecified: Secondary | ICD-10-CM | POA: Diagnosis not present

## 2020-10-17 DIAGNOSIS — F4310 Post-traumatic stress disorder, unspecified: Secondary | ICD-10-CM | POA: Diagnosis not present

## 2020-10-17 DIAGNOSIS — F319 Bipolar disorder, unspecified: Secondary | ICD-10-CM | POA: Diagnosis not present

## 2020-10-17 DIAGNOSIS — F1123 Opioid dependence with withdrawal: Secondary | ICD-10-CM | POA: Diagnosis not present

## 2020-10-19 DIAGNOSIS — F419 Anxiety disorder, unspecified: Secondary | ICD-10-CM | POA: Diagnosis not present

## 2020-10-19 DIAGNOSIS — F329 Major depressive disorder, single episode, unspecified: Secondary | ICD-10-CM | POA: Diagnosis not present

## 2020-10-19 DIAGNOSIS — F1123 Opioid dependence with withdrawal: Secondary | ICD-10-CM | POA: Diagnosis not present

## 2020-10-19 DIAGNOSIS — F319 Bipolar disorder, unspecified: Secondary | ICD-10-CM | POA: Diagnosis not present

## 2020-10-19 DIAGNOSIS — F4310 Post-traumatic stress disorder, unspecified: Secondary | ICD-10-CM | POA: Diagnosis not present

## 2020-10-19 DIAGNOSIS — F10239 Alcohol dependence with withdrawal, unspecified: Secondary | ICD-10-CM | POA: Diagnosis not present

## 2020-10-19 DIAGNOSIS — F1020 Alcohol dependence, uncomplicated: Secondary | ICD-10-CM | POA: Diagnosis not present

## 2020-10-19 DIAGNOSIS — F1120 Opioid dependence, uncomplicated: Secondary | ICD-10-CM | POA: Diagnosis not present

## 2020-10-21 DIAGNOSIS — F10239 Alcohol dependence with withdrawal, unspecified: Secondary | ICD-10-CM | POA: Diagnosis not present

## 2020-10-21 DIAGNOSIS — F1020 Alcohol dependence, uncomplicated: Secondary | ICD-10-CM | POA: Diagnosis not present

## 2020-10-21 DIAGNOSIS — F329 Major depressive disorder, single episode, unspecified: Secondary | ICD-10-CM | POA: Diagnosis not present

## 2020-10-21 DIAGNOSIS — F1120 Opioid dependence, uncomplicated: Secondary | ICD-10-CM | POA: Diagnosis not present

## 2020-10-21 DIAGNOSIS — F419 Anxiety disorder, unspecified: Secondary | ICD-10-CM | POA: Diagnosis not present

## 2020-10-21 DIAGNOSIS — F319 Bipolar disorder, unspecified: Secondary | ICD-10-CM | POA: Diagnosis not present

## 2020-10-21 DIAGNOSIS — F4310 Post-traumatic stress disorder, unspecified: Secondary | ICD-10-CM | POA: Diagnosis not present

## 2020-10-21 DIAGNOSIS — F1123 Opioid dependence with withdrawal: Secondary | ICD-10-CM | POA: Diagnosis not present

## 2020-10-22 DIAGNOSIS — F4310 Post-traumatic stress disorder, unspecified: Secondary | ICD-10-CM | POA: Diagnosis not present

## 2020-10-22 DIAGNOSIS — F1120 Opioid dependence, uncomplicated: Secondary | ICD-10-CM | POA: Diagnosis not present

## 2020-10-22 DIAGNOSIS — F319 Bipolar disorder, unspecified: Secondary | ICD-10-CM | POA: Diagnosis not present

## 2020-10-22 DIAGNOSIS — F1020 Alcohol dependence, uncomplicated: Secondary | ICD-10-CM | POA: Diagnosis not present

## 2020-10-22 DIAGNOSIS — F329 Major depressive disorder, single episode, unspecified: Secondary | ICD-10-CM | POA: Diagnosis not present

## 2020-10-22 DIAGNOSIS — F1123 Opioid dependence with withdrawal: Secondary | ICD-10-CM | POA: Diagnosis not present

## 2020-10-22 DIAGNOSIS — F419 Anxiety disorder, unspecified: Secondary | ICD-10-CM | POA: Diagnosis not present

## 2020-10-22 DIAGNOSIS — F10239 Alcohol dependence with withdrawal, unspecified: Secondary | ICD-10-CM | POA: Diagnosis not present

## 2020-10-23 DIAGNOSIS — F419 Anxiety disorder, unspecified: Secondary | ICD-10-CM | POA: Diagnosis not present

## 2020-10-23 DIAGNOSIS — F4310 Post-traumatic stress disorder, unspecified: Secondary | ICD-10-CM | POA: Diagnosis not present

## 2020-10-23 DIAGNOSIS — F329 Major depressive disorder, single episode, unspecified: Secondary | ICD-10-CM | POA: Diagnosis not present

## 2020-10-23 DIAGNOSIS — F1120 Opioid dependence, uncomplicated: Secondary | ICD-10-CM | POA: Diagnosis not present

## 2020-10-23 DIAGNOSIS — F1123 Opioid dependence with withdrawal: Secondary | ICD-10-CM | POA: Diagnosis not present

## 2020-10-23 DIAGNOSIS — F319 Bipolar disorder, unspecified: Secondary | ICD-10-CM | POA: Diagnosis not present

## 2020-10-23 DIAGNOSIS — F10239 Alcohol dependence with withdrawal, unspecified: Secondary | ICD-10-CM | POA: Diagnosis not present

## 2020-10-23 DIAGNOSIS — F1020 Alcohol dependence, uncomplicated: Secondary | ICD-10-CM | POA: Diagnosis not present

## 2020-10-24 DIAGNOSIS — F1020 Alcohol dependence, uncomplicated: Secondary | ICD-10-CM | POA: Diagnosis not present

## 2020-10-24 DIAGNOSIS — F4310 Post-traumatic stress disorder, unspecified: Secondary | ICD-10-CM | POA: Diagnosis not present

## 2020-10-24 DIAGNOSIS — F419 Anxiety disorder, unspecified: Secondary | ICD-10-CM | POA: Diagnosis not present

## 2020-10-24 DIAGNOSIS — F10239 Alcohol dependence with withdrawal, unspecified: Secondary | ICD-10-CM | POA: Diagnosis not present

## 2020-10-24 DIAGNOSIS — F1123 Opioid dependence with withdrawal: Secondary | ICD-10-CM | POA: Diagnosis not present

## 2020-10-24 DIAGNOSIS — F1120 Opioid dependence, uncomplicated: Secondary | ICD-10-CM | POA: Diagnosis not present

## 2020-10-24 DIAGNOSIS — F329 Major depressive disorder, single episode, unspecified: Secondary | ICD-10-CM | POA: Diagnosis not present

## 2020-10-24 DIAGNOSIS — F319 Bipolar disorder, unspecified: Secondary | ICD-10-CM | POA: Diagnosis not present

## 2020-10-25 DIAGNOSIS — F419 Anxiety disorder, unspecified: Secondary | ICD-10-CM | POA: Diagnosis not present

## 2020-10-25 DIAGNOSIS — F1020 Alcohol dependence, uncomplicated: Secondary | ICD-10-CM | POA: Diagnosis not present

## 2020-10-25 DIAGNOSIS — F329 Major depressive disorder, single episode, unspecified: Secondary | ICD-10-CM | POA: Diagnosis not present

## 2020-10-25 DIAGNOSIS — F1120 Opioid dependence, uncomplicated: Secondary | ICD-10-CM | POA: Diagnosis not present

## 2020-10-25 DIAGNOSIS — F4310 Post-traumatic stress disorder, unspecified: Secondary | ICD-10-CM | POA: Diagnosis not present

## 2020-10-25 DIAGNOSIS — F1123 Opioid dependence with withdrawal: Secondary | ICD-10-CM | POA: Diagnosis not present

## 2020-10-25 DIAGNOSIS — F319 Bipolar disorder, unspecified: Secondary | ICD-10-CM | POA: Diagnosis not present

## 2020-10-25 DIAGNOSIS — F10239 Alcohol dependence with withdrawal, unspecified: Secondary | ICD-10-CM | POA: Diagnosis not present

## 2020-10-26 DIAGNOSIS — F1020 Alcohol dependence, uncomplicated: Secondary | ICD-10-CM | POA: Diagnosis not present

## 2020-10-26 DIAGNOSIS — F1123 Opioid dependence with withdrawal: Secondary | ICD-10-CM | POA: Diagnosis not present

## 2020-10-26 DIAGNOSIS — F319 Bipolar disorder, unspecified: Secondary | ICD-10-CM | POA: Diagnosis not present

## 2020-10-26 DIAGNOSIS — F1120 Opioid dependence, uncomplicated: Secondary | ICD-10-CM | POA: Diagnosis not present

## 2020-10-26 DIAGNOSIS — F329 Major depressive disorder, single episode, unspecified: Secondary | ICD-10-CM | POA: Diagnosis not present

## 2020-10-26 DIAGNOSIS — F10239 Alcohol dependence with withdrawal, unspecified: Secondary | ICD-10-CM | POA: Diagnosis not present

## 2020-10-26 DIAGNOSIS — F4310 Post-traumatic stress disorder, unspecified: Secondary | ICD-10-CM | POA: Diagnosis not present

## 2020-10-26 DIAGNOSIS — F419 Anxiety disorder, unspecified: Secondary | ICD-10-CM | POA: Diagnosis not present

## 2020-10-27 DIAGNOSIS — F319 Bipolar disorder, unspecified: Secondary | ICD-10-CM | POA: Diagnosis not present

## 2020-10-27 DIAGNOSIS — F1123 Opioid dependence with withdrawal: Secondary | ICD-10-CM | POA: Diagnosis not present

## 2020-10-27 DIAGNOSIS — F329 Major depressive disorder, single episode, unspecified: Secondary | ICD-10-CM | POA: Diagnosis not present

## 2020-10-27 DIAGNOSIS — F1120 Opioid dependence, uncomplicated: Secondary | ICD-10-CM | POA: Diagnosis not present

## 2020-10-27 DIAGNOSIS — F419 Anxiety disorder, unspecified: Secondary | ICD-10-CM | POA: Diagnosis not present

## 2020-10-27 DIAGNOSIS — F10239 Alcohol dependence with withdrawal, unspecified: Secondary | ICD-10-CM | POA: Diagnosis not present

## 2020-10-27 DIAGNOSIS — F1020 Alcohol dependence, uncomplicated: Secondary | ICD-10-CM | POA: Diagnosis not present

## 2020-10-27 DIAGNOSIS — F4310 Post-traumatic stress disorder, unspecified: Secondary | ICD-10-CM | POA: Diagnosis not present

## 2020-10-28 DIAGNOSIS — F1120 Opioid dependence, uncomplicated: Secondary | ICD-10-CM | POA: Diagnosis not present

## 2020-10-28 DIAGNOSIS — F419 Anxiety disorder, unspecified: Secondary | ICD-10-CM | POA: Diagnosis not present

## 2020-10-28 DIAGNOSIS — F319 Bipolar disorder, unspecified: Secondary | ICD-10-CM | POA: Diagnosis not present

## 2020-10-28 DIAGNOSIS — F329 Major depressive disorder, single episode, unspecified: Secondary | ICD-10-CM | POA: Diagnosis not present

## 2020-10-28 DIAGNOSIS — F10239 Alcohol dependence with withdrawal, unspecified: Secondary | ICD-10-CM | POA: Diagnosis not present

## 2020-10-28 DIAGNOSIS — F1020 Alcohol dependence, uncomplicated: Secondary | ICD-10-CM | POA: Diagnosis not present

## 2020-10-28 DIAGNOSIS — F1123 Opioid dependence with withdrawal: Secondary | ICD-10-CM | POA: Diagnosis not present

## 2020-10-28 DIAGNOSIS — F4310 Post-traumatic stress disorder, unspecified: Secondary | ICD-10-CM | POA: Diagnosis not present

## 2020-10-29 DIAGNOSIS — F329 Major depressive disorder, single episode, unspecified: Secondary | ICD-10-CM | POA: Diagnosis not present

## 2020-10-29 DIAGNOSIS — F319 Bipolar disorder, unspecified: Secondary | ICD-10-CM | POA: Diagnosis not present

## 2020-10-29 DIAGNOSIS — F4310 Post-traumatic stress disorder, unspecified: Secondary | ICD-10-CM | POA: Diagnosis not present

## 2020-10-29 DIAGNOSIS — F1120 Opioid dependence, uncomplicated: Secondary | ICD-10-CM | POA: Diagnosis not present

## 2020-10-29 DIAGNOSIS — F419 Anxiety disorder, unspecified: Secondary | ICD-10-CM | POA: Diagnosis not present

## 2020-10-29 DIAGNOSIS — F1020 Alcohol dependence, uncomplicated: Secondary | ICD-10-CM | POA: Diagnosis not present

## 2020-10-29 DIAGNOSIS — F1123 Opioid dependence with withdrawal: Secondary | ICD-10-CM | POA: Diagnosis not present

## 2020-10-29 DIAGNOSIS — F10239 Alcohol dependence with withdrawal, unspecified: Secondary | ICD-10-CM | POA: Diagnosis not present

## 2020-10-30 DIAGNOSIS — F1120 Opioid dependence, uncomplicated: Secondary | ICD-10-CM | POA: Diagnosis not present

## 2020-10-30 DIAGNOSIS — F10239 Alcohol dependence with withdrawal, unspecified: Secondary | ICD-10-CM | POA: Diagnosis not present

## 2020-10-30 DIAGNOSIS — F1123 Opioid dependence with withdrawal: Secondary | ICD-10-CM | POA: Diagnosis not present

## 2020-10-30 DIAGNOSIS — F419 Anxiety disorder, unspecified: Secondary | ICD-10-CM | POA: Diagnosis not present

## 2020-10-30 DIAGNOSIS — F329 Major depressive disorder, single episode, unspecified: Secondary | ICD-10-CM | POA: Diagnosis not present

## 2020-10-30 DIAGNOSIS — F1020 Alcohol dependence, uncomplicated: Secondary | ICD-10-CM | POA: Diagnosis not present

## 2020-10-30 DIAGNOSIS — F319 Bipolar disorder, unspecified: Secondary | ICD-10-CM | POA: Diagnosis not present

## 2020-10-30 DIAGNOSIS — F4310 Post-traumatic stress disorder, unspecified: Secondary | ICD-10-CM | POA: Diagnosis not present

## 2020-10-30 LAB — BLOOD GAS, ARTERIAL
Acid-Base Excess: 6.8 mmol/L — ABNORMAL HIGH (ref 0.0–2.0)
Bicarbonate: 31.3 mmol/L — ABNORMAL HIGH (ref 20.0–28.0)
FIO2: 0.36
O2 Saturation: 92.9 %
Patient temperature: 37
pCO2 arterial: 43 mmHg (ref 32.0–48.0)
pH, Arterial: 7.47 — ABNORMAL HIGH (ref 7.350–7.450)
pO2, Arterial: 62 mmHg — ABNORMAL LOW (ref 83.0–108.0)

## 2020-10-31 DIAGNOSIS — F4310 Post-traumatic stress disorder, unspecified: Secondary | ICD-10-CM | POA: Diagnosis not present

## 2020-10-31 DIAGNOSIS — F319 Bipolar disorder, unspecified: Secondary | ICD-10-CM | POA: Diagnosis not present

## 2020-10-31 DIAGNOSIS — F329 Major depressive disorder, single episode, unspecified: Secondary | ICD-10-CM | POA: Diagnosis not present

## 2020-10-31 DIAGNOSIS — F1020 Alcohol dependence, uncomplicated: Secondary | ICD-10-CM | POA: Diagnosis not present

## 2020-10-31 DIAGNOSIS — F419 Anxiety disorder, unspecified: Secondary | ICD-10-CM | POA: Diagnosis not present

## 2020-10-31 DIAGNOSIS — F1123 Opioid dependence with withdrawal: Secondary | ICD-10-CM | POA: Diagnosis not present

## 2020-10-31 DIAGNOSIS — F10239 Alcohol dependence with withdrawal, unspecified: Secondary | ICD-10-CM | POA: Diagnosis not present

## 2020-10-31 DIAGNOSIS — F1120 Opioid dependence, uncomplicated: Secondary | ICD-10-CM | POA: Diagnosis not present

## 2020-11-01 DIAGNOSIS — F419 Anxiety disorder, unspecified: Secondary | ICD-10-CM | POA: Diagnosis not present

## 2020-11-01 DIAGNOSIS — F319 Bipolar disorder, unspecified: Secondary | ICD-10-CM | POA: Diagnosis not present

## 2020-11-01 DIAGNOSIS — F329 Major depressive disorder, single episode, unspecified: Secondary | ICD-10-CM | POA: Diagnosis not present

## 2020-11-01 DIAGNOSIS — F1123 Opioid dependence with withdrawal: Secondary | ICD-10-CM | POA: Diagnosis not present

## 2020-11-01 DIAGNOSIS — F1120 Opioid dependence, uncomplicated: Secondary | ICD-10-CM | POA: Diagnosis not present

## 2020-11-01 DIAGNOSIS — F10239 Alcohol dependence with withdrawal, unspecified: Secondary | ICD-10-CM | POA: Diagnosis not present

## 2020-11-01 DIAGNOSIS — F1020 Alcohol dependence, uncomplicated: Secondary | ICD-10-CM | POA: Diagnosis not present

## 2020-11-01 DIAGNOSIS — F4310 Post-traumatic stress disorder, unspecified: Secondary | ICD-10-CM | POA: Diagnosis not present

## 2020-11-02 DIAGNOSIS — F1020 Alcohol dependence, uncomplicated: Secondary | ICD-10-CM | POA: Diagnosis not present

## 2020-11-02 DIAGNOSIS — F329 Major depressive disorder, single episode, unspecified: Secondary | ICD-10-CM | POA: Diagnosis not present

## 2020-11-02 DIAGNOSIS — F4310 Post-traumatic stress disorder, unspecified: Secondary | ICD-10-CM | POA: Diagnosis not present

## 2020-11-02 DIAGNOSIS — F419 Anxiety disorder, unspecified: Secondary | ICD-10-CM | POA: Diagnosis not present

## 2020-11-02 DIAGNOSIS — F319 Bipolar disorder, unspecified: Secondary | ICD-10-CM | POA: Diagnosis not present

## 2020-11-02 DIAGNOSIS — F10239 Alcohol dependence with withdrawal, unspecified: Secondary | ICD-10-CM | POA: Diagnosis not present

## 2020-11-02 DIAGNOSIS — F1120 Opioid dependence, uncomplicated: Secondary | ICD-10-CM | POA: Diagnosis not present

## 2020-11-02 DIAGNOSIS — F1123 Opioid dependence with withdrawal: Secondary | ICD-10-CM | POA: Diagnosis not present

## 2020-11-03 DIAGNOSIS — F1020 Alcohol dependence, uncomplicated: Secondary | ICD-10-CM | POA: Diagnosis not present

## 2020-11-03 DIAGNOSIS — F319 Bipolar disorder, unspecified: Secondary | ICD-10-CM | POA: Diagnosis not present

## 2020-11-03 DIAGNOSIS — F1123 Opioid dependence with withdrawal: Secondary | ICD-10-CM | POA: Diagnosis not present

## 2020-11-03 DIAGNOSIS — F419 Anxiety disorder, unspecified: Secondary | ICD-10-CM | POA: Diagnosis not present

## 2020-11-03 DIAGNOSIS — F329 Major depressive disorder, single episode, unspecified: Secondary | ICD-10-CM | POA: Diagnosis not present

## 2020-11-03 DIAGNOSIS — F10239 Alcohol dependence with withdrawal, unspecified: Secondary | ICD-10-CM | POA: Diagnosis not present

## 2020-11-03 DIAGNOSIS — F1120 Opioid dependence, uncomplicated: Secondary | ICD-10-CM | POA: Diagnosis not present

## 2020-11-03 DIAGNOSIS — F4310 Post-traumatic stress disorder, unspecified: Secondary | ICD-10-CM | POA: Diagnosis not present

## 2020-11-04 DIAGNOSIS — F1120 Opioid dependence, uncomplicated: Secondary | ICD-10-CM | POA: Diagnosis not present

## 2020-11-04 DIAGNOSIS — F1020 Alcohol dependence, uncomplicated: Secondary | ICD-10-CM | POA: Diagnosis not present

## 2020-11-04 DIAGNOSIS — F4310 Post-traumatic stress disorder, unspecified: Secondary | ICD-10-CM | POA: Diagnosis not present

## 2020-11-04 DIAGNOSIS — F10239 Alcohol dependence with withdrawal, unspecified: Secondary | ICD-10-CM | POA: Diagnosis not present

## 2020-11-04 DIAGNOSIS — F319 Bipolar disorder, unspecified: Secondary | ICD-10-CM | POA: Diagnosis not present

## 2020-11-04 DIAGNOSIS — F1123 Opioid dependence with withdrawal: Secondary | ICD-10-CM | POA: Diagnosis not present

## 2020-11-04 DIAGNOSIS — F419 Anxiety disorder, unspecified: Secondary | ICD-10-CM | POA: Diagnosis not present

## 2020-11-04 DIAGNOSIS — F329 Major depressive disorder, single episode, unspecified: Secondary | ICD-10-CM | POA: Diagnosis not present

## 2020-11-16 DIAGNOSIS — R0689 Other abnormalities of breathing: Secondary | ICD-10-CM | POA: Diagnosis not present

## 2020-11-16 DIAGNOSIS — R000 Tachycardia, unspecified: Secondary | ICD-10-CM | POA: Diagnosis not present

## 2020-11-16 DIAGNOSIS — I499 Cardiac arrhythmia, unspecified: Secondary | ICD-10-CM | POA: Diagnosis not present

## 2020-11-16 DIAGNOSIS — R001 Bradycardia, unspecified: Secondary | ICD-10-CM | POA: Diagnosis not present

## 2020-11-16 DIAGNOSIS — W19XXXA Unspecified fall, initial encounter: Secondary | ICD-10-CM | POA: Diagnosis not present

## 2022-12-06 IMAGING — CR DG WRIST COMPLETE 3+V*L*
1 series · 4 of 4 positions shown · non-contrast
Comparison: None.

CLINICAL DATA: Pain and swelling status post fall

EXAM:
LEFT WRIST - COMPLETE 3+ VIEW

[Series 1: dg wrist complete left · 0.14mm/px · 4 of 4 slices shown]
[im 1/4]
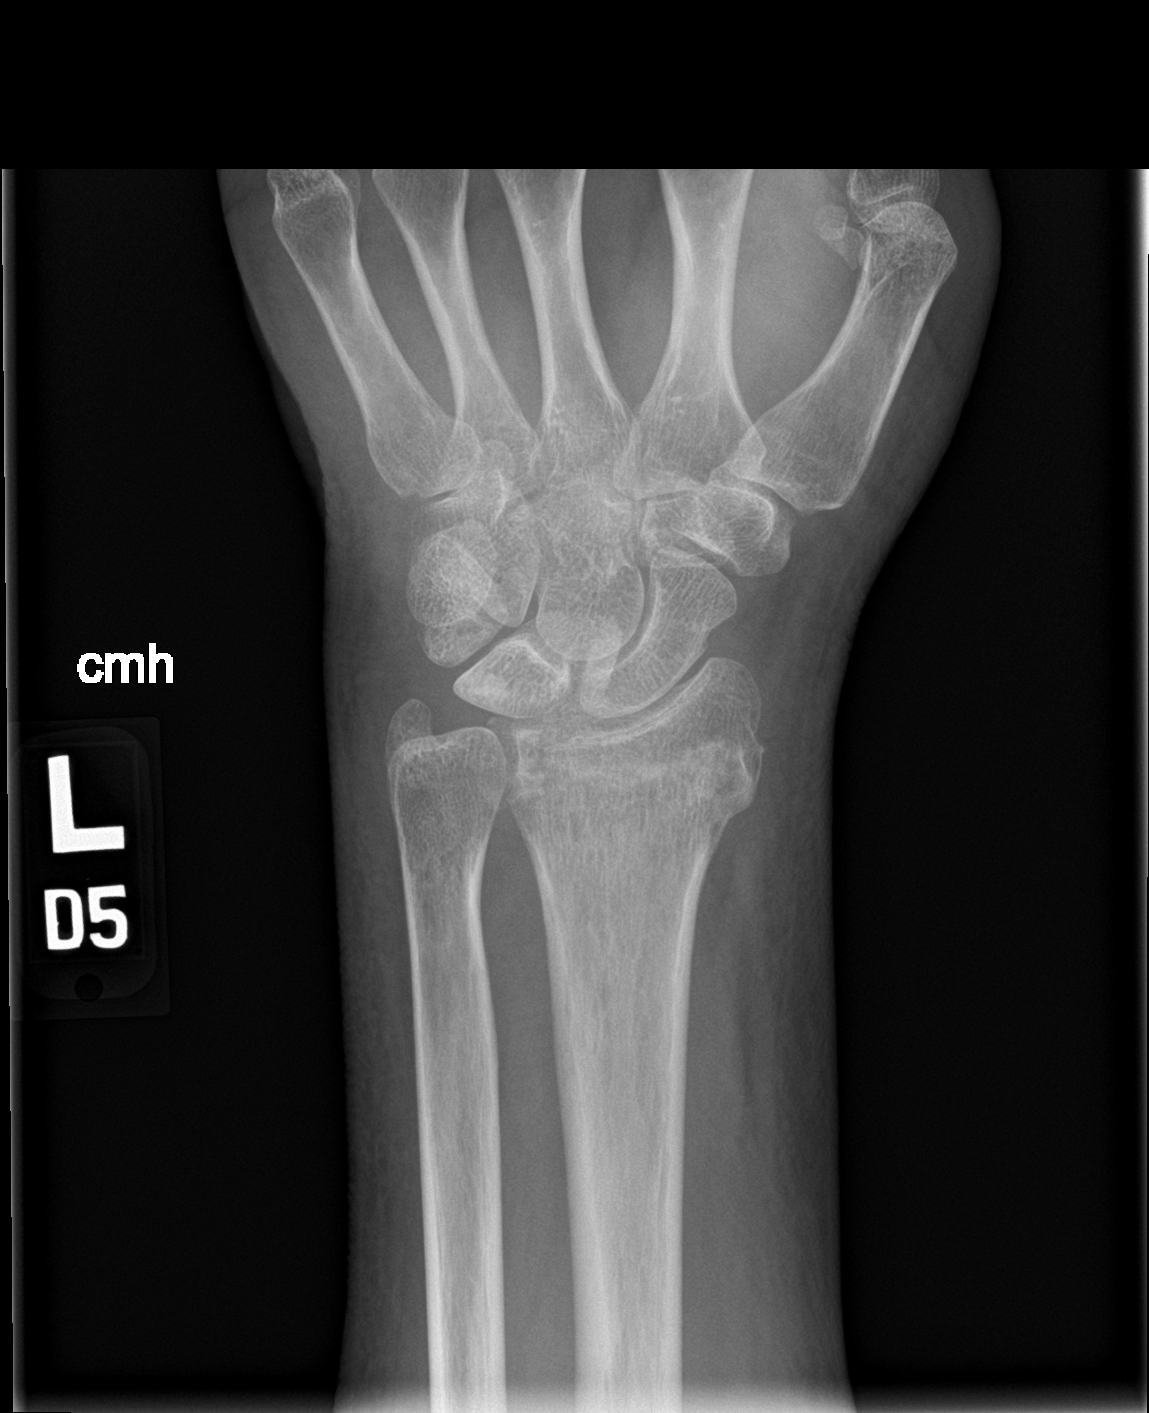
[im 2/4]
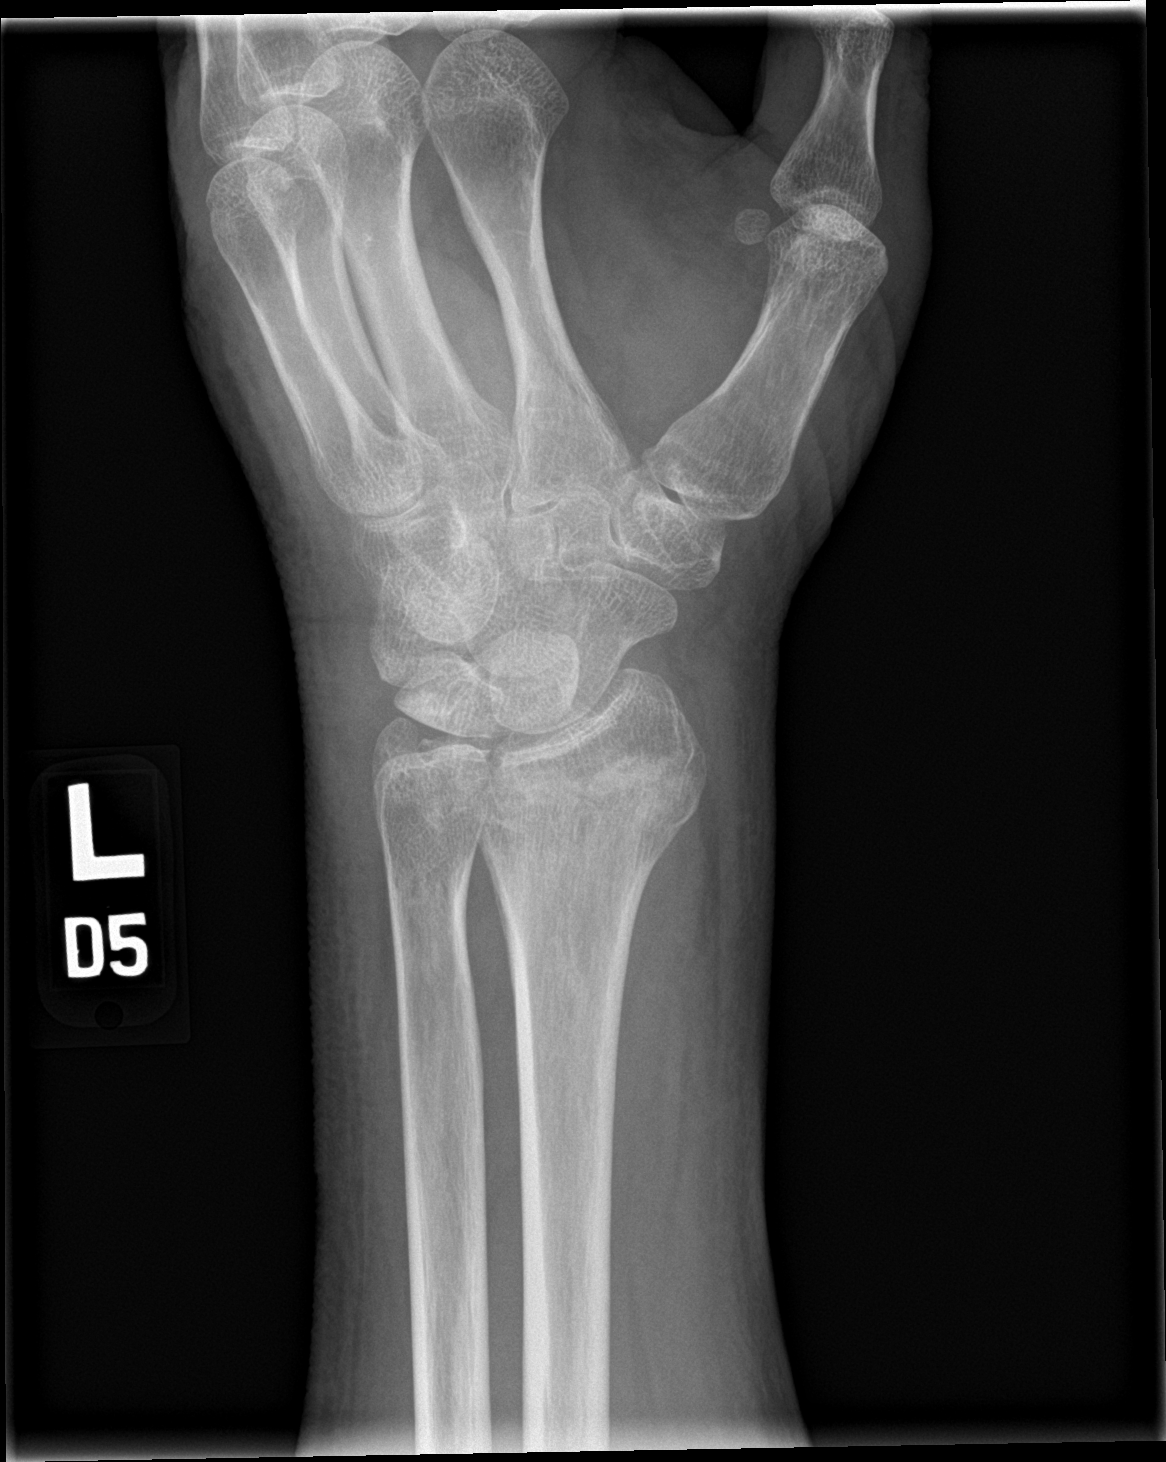
[im 3/4]
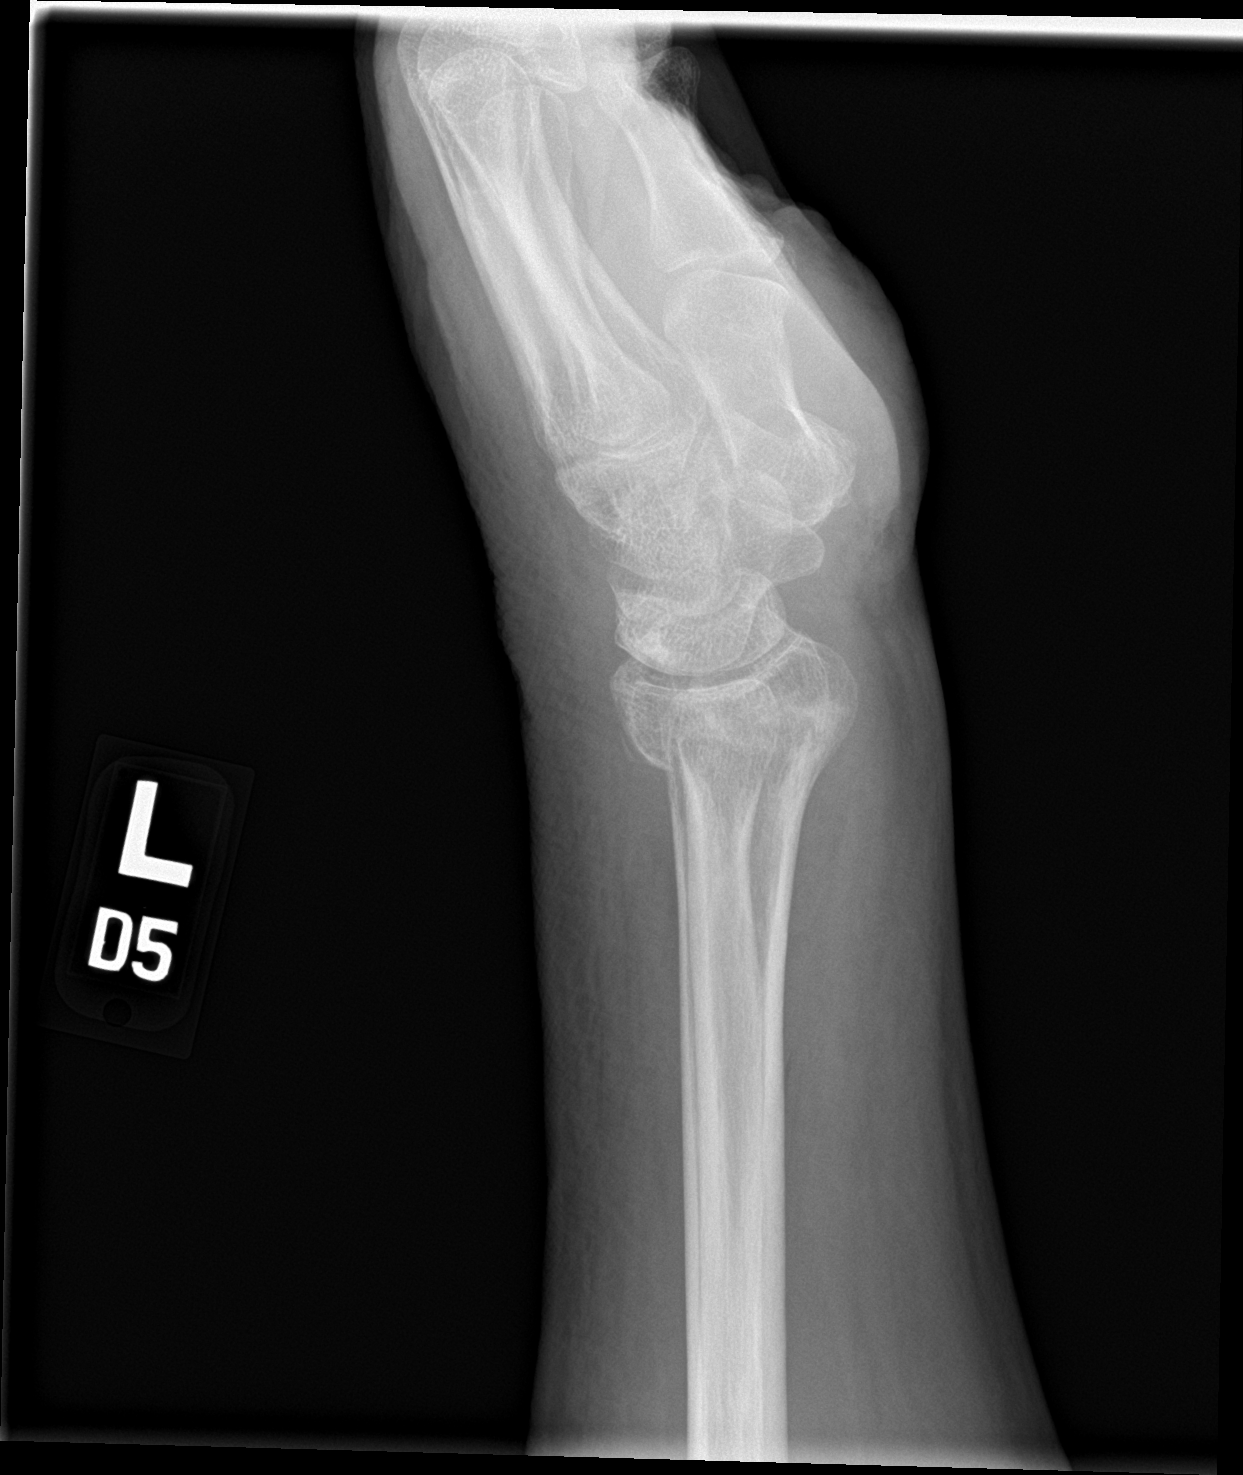
[im 4/4]
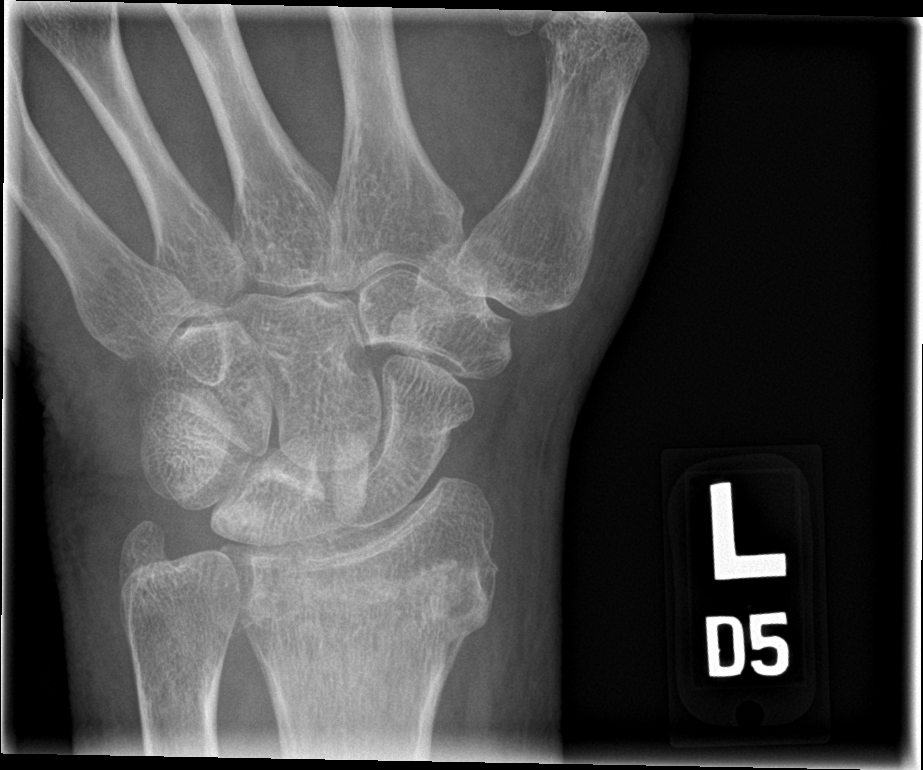

[4 of 4 positions shown; findings below may reference images not displayed]

FINDINGS: There is an acute, angulated and impacted fracture of the distal
radius with surrounding soft tissue swelling. There is no
dislocation. No radiopaque foreign body.
IMPRESSION: Acute, angulated and impacted fracture of the distal radius with
surrounding soft tissue swelling.
